# Patient Record
Sex: Female | Born: 1935
Health system: Southern US, Community
[De-identification: ages and names within clinical notes are randomized; demographics above are authoritative.]

## PROBLEM LIST (undated history)

## (undated) DIAGNOSIS — Z950 Presence of cardiac pacemaker: Secondary | ICD-10-CM

## (undated) DIAGNOSIS — I10 Essential (primary) hypertension: Secondary | ICD-10-CM

## (undated) DIAGNOSIS — K219 Gastro-esophageal reflux disease without esophagitis: Secondary | ICD-10-CM

## (undated) DIAGNOSIS — K449 Diaphragmatic hernia without obstruction or gangrene: Secondary | ICD-10-CM

## (undated) DIAGNOSIS — R001 Bradycardia, unspecified: Secondary | ICD-10-CM

## (undated) DIAGNOSIS — I639 Cerebral infarction, unspecified: Secondary | ICD-10-CM

## (undated) DIAGNOSIS — M1990 Unspecified osteoarthritis, unspecified site: Secondary | ICD-10-CM

## (undated) DIAGNOSIS — E7800 Pure hypercholesterolemia, unspecified: Secondary | ICD-10-CM

## (undated) HISTORY — PX: PACEMAKER INSERTION: SHX728

## (undated) HISTORY — PX: APPENDECTOMY: SHX54

## (undated) HISTORY — PX: TONSILLECTOMY: SUR1361

## (undated) HISTORY — DX: Cerebral infarction, unspecified: I63.9

## (undated) HISTORY — DX: Diaphragmatic hernia without obstruction or gangrene: K44.9

## (undated) SURGERY — ESOPHAGOGASTRODUODENOSCOPY (EGD) WITH PROPOFOL
Anesthesia: General

---

## 2004-07-06 ENCOUNTER — Ambulatory Visit: Payer: Self-pay | Admitting: Family Medicine

## 2004-08-09 ENCOUNTER — Ambulatory Visit: Payer: Self-pay | Admitting: Family Medicine

## 2004-11-17 ENCOUNTER — Ambulatory Visit: Payer: Self-pay | Admitting: Family Medicine

## 2004-12-19 ENCOUNTER — Ambulatory Visit: Payer: Self-pay | Admitting: Family Medicine

## 2005-01-04 ENCOUNTER — Ambulatory Visit: Payer: Self-pay | Admitting: Family Medicine

## 2005-01-16 ENCOUNTER — Ambulatory Visit: Payer: Self-pay | Admitting: Family Medicine

## 2005-03-14 ENCOUNTER — Ambulatory Visit: Payer: Self-pay | Admitting: Family Medicine

## 2005-04-12 ENCOUNTER — Ambulatory Visit: Payer: Self-pay | Admitting: Family Medicine

## 2005-04-27 ENCOUNTER — Ambulatory Visit: Payer: Self-pay | Admitting: Family Medicine

## 2005-07-31 ENCOUNTER — Ambulatory Visit: Payer: Self-pay | Admitting: Family Medicine

## 2005-11-30 ENCOUNTER — Ambulatory Visit: Payer: Self-pay | Admitting: Family Medicine

## 2005-12-20 ENCOUNTER — Ambulatory Visit: Payer: Self-pay | Admitting: Family Medicine

## 2005-12-28 ENCOUNTER — Ambulatory Visit: Payer: Self-pay | Admitting: Family Medicine

## 2008-08-14 ENCOUNTER — Ambulatory Visit: Payer: Self-pay | Admitting: Cardiology

## 2008-08-14 ENCOUNTER — Inpatient Hospital Stay (HOSPITAL_COMMUNITY): Admission: EM | Admit: 2008-08-14 | Discharge: 2008-08-18 | Payer: Self-pay | Admitting: Emergency Medicine

## 2008-08-17 ENCOUNTER — Ambulatory Visit: Payer: Self-pay | Admitting: Cardiology

## 2008-08-17 ENCOUNTER — Encounter: Payer: Self-pay | Admitting: Cardiology

## 2008-08-19 ENCOUNTER — Encounter: Payer: Self-pay | Admitting: Internal Medicine

## 2008-09-10 ENCOUNTER — Ambulatory Visit: Payer: Self-pay

## 2008-11-06 ENCOUNTER — Telehealth: Payer: Self-pay | Admitting: Internal Medicine

## 2008-11-07 DIAGNOSIS — K219 Gastro-esophageal reflux disease without esophagitis: Secondary | ICD-10-CM | POA: Insufficient documentation

## 2008-11-07 DIAGNOSIS — I10 Essential (primary) hypertension: Secondary | ICD-10-CM | POA: Insufficient documentation

## 2008-11-07 DIAGNOSIS — I498 Other specified cardiac arrhythmias: Secondary | ICD-10-CM

## 2008-11-07 DIAGNOSIS — K449 Diaphragmatic hernia without obstruction or gangrene: Secondary | ICD-10-CM

## 2008-11-19 ENCOUNTER — Ambulatory Visit: Payer: Self-pay | Admitting: Internal Medicine

## 2008-11-19 DIAGNOSIS — Z950 Presence of cardiac pacemaker: Secondary | ICD-10-CM | POA: Insufficient documentation

## 2008-12-21 ENCOUNTER — Encounter: Payer: Self-pay | Admitting: Internal Medicine

## 2009-08-04 ENCOUNTER — Encounter: Admission: RE | Admit: 2009-08-04 | Discharge: 2009-08-04 | Payer: Self-pay | Admitting: Family Medicine

## 2009-08-24 ENCOUNTER — Ambulatory Visit: Payer: Self-pay | Admitting: Internal Medicine

## 2009-11-24 ENCOUNTER — Ambulatory Visit: Payer: Self-pay | Admitting: Internal Medicine

## 2009-12-06 ENCOUNTER — Encounter: Payer: Self-pay | Admitting: Internal Medicine

## 2010-02-24 ENCOUNTER — Ambulatory Visit: Payer: Self-pay | Admitting: Internal Medicine

## 2010-03-16 ENCOUNTER — Encounter: Payer: Self-pay | Admitting: Internal Medicine

## 2010-06-02 ENCOUNTER — Ambulatory Visit: Payer: Self-pay | Admitting: Internal Medicine

## 2010-06-15 ENCOUNTER — Encounter: Payer: Self-pay | Admitting: Internal Medicine

## 2010-08-04 NOTE — Letter (Signed)
Summary: Remote Device Check  [facility] [employer], Main Office  [address]   [place], [place] [address]   Phone: [phone]  Fax: [phone]     December 06, 2009 MRN: [id number]   Kelsey Thompson [address] [place], [place]  [address]   Dear [name],   Your remote transmission was recieved and reviewed by your physician.  All diagnostics were within normal limits for you.  __X___Your next transmission is scheduled for:   02-24-2010.  Please transmit at any time this day.  If you have a wireless device your transmission will be sent automatically.   Sincerely,  Vella Kohler

## 2010-08-04 NOTE — Assessment & Plan Note (Signed)
Summary: medtronic/saf   Primary Provider:  Dr.Nyland in [place]    History of Present Illness: Kelsey Thompson returns today for followup.  She is an elderly woman with a h/o bradycardia who underwent PPM in February of 2010.  She returns today for followup.  She denies syncope and has very rare palpitation.   No peripheral edema at present.  No fever and chills.  She admits to dietary indiscretion with sodium.  Her blood pressure is not well controlled.   Current Medications (verified): 1)  Aspirin Ec 325 Mg Tbec (Aspirin) .... Take One Tablet By Mouth Daily 2)  Enalapril Maleate 5 Mg Tabs (Enalapril Maleate) .... Take One Tablet By Mouth Once Daily 3)  Nexium 40 Mg Cpdr (Esomeprazole Magnesium) .Marland Kitchen.. 1 By Mouth Once Daily 4)  Calcium-Vitamin D 600-200 Mg-Unit Tabs (Calcium-Vitamin D) .... 1/2 Tab Once Daily 5)  Fish Oil   Oil (Fish Oil) .... Once Daily  Allergies: 1)  ! Penicillin 2)  ! * Horse Serum  Past History:  Past Medical History: Last updated: 11/07/2008 Current Problems:  HYPERTENSION (ICD-401.9) BRADYCARDIA (ICD-427.89) HIATAL HERNIA (ICD-553.3) GERD (ICD-530.81)  Past Surgical History: Last updated: 11/07/2008  1 status post implant of a Medtronic Adapta dual- chamber pacemaker, Dr. Lewayne Bunting.    Cesarean section.   Status post appendectomy.    Status post tonsillectomy.   Review of Systems       The patient complains of dyspnea on exertion and peripheral edema.  The patient denies chest pain and syncope.    Vital Signs:  Patient profile:   77 year old female Height:      62 inches Weight:      135 pounds Pulse rate:   60 / minute BP sitting:   170 / 80  (left arm)  Vitals Entered By: Laurance Flatten CMA (August 24, 2009 10:28 AM)  Physical Exam  General:  Well developed, well nourished, in no acute distress. Head:  normocephalic and atraumatic Eyes:  PERRLA/EOM intact; conjunctiva and lids normal. Mouth:  Teeth, gums and palate normal. Oral mucosa  normal. Neck:  Neck supple, no JVD. No masses, thyromegaly or abnormal cervical nodes. Chest Wall:  Well healed PPM incision. Lungs:  Clear bilaterally to auscultation and percussion. Heart:  RRR with normal S1 and S2.  PMI is not enlarged or laterally displaced. Abdomen:  Bowel sounds positive; abdomen soft and non-tender without masses, organomegaly, or hernias noted. No hepatosplenomegaly. Msk:  Back normal, normal gait. Muscle strength and tone normal. Pulses:  pulses normal in all 4 extremities Extremities:  No clubbing or cyanosis.1-2+ edema. Neurologic:  Alert and oriented x 3.   PPM Specifications Following MD:  Lewayne Bunting, MD     PPM Vendor:  Medtronic     PPM Model Number:  ADDRL1     PPM Serial Number:  [id number] PPM DOI:  08/17/2008      Lead 1    Location: RA     DOI: 08/17/2008     Model #: 4098     Serial #: [id number]     Status: active Lead 2    Location: RV     DOI: 08/17/2008     Model #: 6213     Serial #: [id number]     Status: active  Magnet Response Rate:  BOL 85 ERI  65  Indications:  Sinus node dysfunction;  Huston Foley   PPM Follow Up Remote Check?  No Battery Voltage:  2.79 V     Battery  Est. Longevity:  10 years     Pacer Dependent:  No       PPM Device Measurements Atrium  Amplitude: 2.8 mV, Impedance: 431 ohms, Threshold: 0.5 V at 0.4 msec Right Ventricle  Amplitude: 5.6 mV, Impedance: 693 ohms, Threshold: 0.75 V at 0.4 msec  Episodes MS Episodes:  9     Percent Mode Switch:  <0.1%     Coumadin:  No Ventricular High Rate:  9     Atrial Pacing:  54%     Ventricular Pacing:  0.3%  Parameters Mode:  DDDR     Lower Rate Limit:  60     Upper Rate Limit:  130 Paced AV Delay:  300     Sensed AV Delay:  300 Next Remote Date:  11/24/2009     Next Cardiology Appt Due:  08/03/2010 Tech Comments:  No parameter changes.  9 VHR episodes the longest 6 seconds.  >85K single PVC's.  Carelink transmissions every 3 months. ROV 1 year Dr. Ladona Ridgel. Altha Harm, LPN   August 24, 2009 10:42 AM  MD Comments:  Agree with above.  Impression & Recommendations:  Problem # 1:  CARDIAC PACEMAKER IN SITU (ICD-V45.01) Her device is working normally.  Will recheck in several months.  Problem # 2:  HYPERTENSION (ICD-401.9) Her pressures are not well controlled today though she states that at home they are near normal.  I have asked her to maintain a low sodium diet, to record her pressures and followup with Dr. Kriste Basque. Her updated medication list for this problem includes:    Aspirin Ec 325 Mg Tbec (Aspirin) .Marland Kitchen... Take one tablet by mouth daily    Enalapril Maleate 5 Mg Tabs (Enalapril maleate) .Marland Kitchen... Take one tablet by mouth once daily  Patient Instructions: 1)  Your physician recommends that you schedule a follow-up appointment in: 11/24/09 -Carelink transmission  12 months with Dr Ladona Ridgel

## 2010-08-04 NOTE — Cardiovascular Report (Signed)
Summary: Office Visit Remote   Office Visit Remote   Imported By: Roderic Ovens 03/22/2010 13:56:24  _____________________________________________________________________  External Attachment:    Type:   Image     Comment:   External Document

## 2010-08-04 NOTE — Letter (Signed)
Summary: Remote Device Check  [facility], Main Office  [address]   [place], [place] [address]   Phone: [phone]  Fax: [phone]     June 15, 2010 MRN: [id number]   LETISIA SCHWALB [address] [place], [place]  [address]   Dear Ms. Livas,   Your remote transmission was recieved and reviewed by your physician.  All diagnostics were within normal limits for you.  __X____Your next office visit is scheduled for:  March 2012 with Dr Ladona Ridgel. Please call our office to schedule an appointment.    Sincerely,  Vella Kohler

## 2010-08-04 NOTE — Cardiovascular Report (Signed)
Summary: Office Visit Remote   Office Visit Remote   Imported By: Roderic Ovens 12/20/2009 15:22:50  _____________________________________________________________________  External Attachment:    Type:   Image     Comment:   External Document

## 2010-08-04 NOTE — Letter (Signed)
Summary: Remote Device Check  [facility], Main Office  [address]   [place], [place] [address]   Phone: [phone]  Fax: [phone]     March 16, 2010 MRN: [id number]   ZENIYA LAPIDUS [address] [place], [place]  [address]   Dear Ms. Trew,   Your remote transmission was recieved and reviewed by your physician.  All diagnostics were within normal limits for you.  __X___Your next transmission is scheduled for:  06-02-10.  Please transmit at any time this day.  If you have a wireless device your transmission will be sent automatically.   Sincerely,  Vella Kohler

## 2010-08-04 NOTE — Cardiovascular Report (Signed)
Summary: Office Visit Remote   Office Visit Remote   Imported By: Roderic Ovens 06/16/2010 14:13:28  _____________________________________________________________________  External Attachment:    Type:   Image     Comment:   External Document

## 2010-09-09 ENCOUNTER — Encounter: Payer: Self-pay | Admitting: Internal Medicine

## 2010-09-09 ENCOUNTER — Encounter (INDEPENDENT_AMBULATORY_CARE_PROVIDER_SITE_OTHER): Payer: Medicare Other | Admitting: Internal Medicine

## 2010-09-09 DIAGNOSIS — I5032 Chronic diastolic (congestive) heart failure: Secondary | ICD-10-CM

## 2010-09-09 DIAGNOSIS — I498 Other specified cardiac arrhythmias: Secondary | ICD-10-CM

## 2010-09-09 DIAGNOSIS — I10 Essential (primary) hypertension: Secondary | ICD-10-CM

## 2010-09-13 NOTE — Assessment & Plan Note (Signed)
Summary: mdt ppm-appt is 2:45 gd/rs per pt-mj rs time per pt call/gd r...   Visit Type:  Follow-up Primary Provider:  Dr.Nyland in [place]   CC:  Pacer check.  Pt took Enalapril this morning.  History of Present Illness: Kelsey Thompson returns today for followup.  She is an elderly woman with a h/o bradycardia who underwent PPM in February of 2010.  She returns today for followup.  She denies syncope and has very rare palpitation.   No peripheral edema at present.  No fever and chills.  She admits to dietary indiscretion with sodium.  Her blood pressure is not well controlled. No c/p.   Current Medications (verified): 1)  Aspirin Ec 325 Mg Tbec (Aspirin) .... Take One Tablet By Mouth Daily 2)  Enalapril Maleate 5 Mg Tabs (Enalapril Maleate) .... Take One Tablet By Mouth Once Daily 3)  Nexium 40 Mg Cpdr (Esomeprazole Magnesium) .Marland Kitchen.. 1 By Mouth Once Daily 4)  Calcium-Vitamin D 600-200 Mg-Unit Tabs (Calcium-Vitamin D) .... 1/2 Tab Once Daily 5)  Fish Oil   Oil (Fish Oil) .... Once Daily  Allergies: 1)  ! Penicillin 2)  ! * Horse Serum   Past History:  Past Medical History: Last updated: 11/07/2008 Current Problems:  HYPERTENSION (ICD-401.9) BRADYCARDIA (ICD-427.89) HIATAL HERNIA (ICD-553.3) GERD (ICD-530.81)  Past Surgical History: Last updated: 11/07/2008  1 status post implant of a Medtronic Adapta dual- chamber pacemaker, Dr. Lewayne Bunting.    Cesarean section.   Status post appendectomy.    Status post tonsillectomy.   Review of Systems  The patient denies chest pain, syncope, dyspnea on exertion, and peripheral edema.    Vital Signs:  Patient profile:   77 year old female Height:      62 inches Weight:      135 pounds BMI:     24.78 Pulse rate:   80 / minute Pulse rhythm:   regular Resp:     18 per minute BP sitting:   184 / 92  (right arm) Cuff size:   regular  Vitals Entered By: Kelsey Thompson (September 09, 2010 10:37 AM)  Physical Exam  General:  Well  developed, well nourished, in no acute distress. Head:  normocephalic and atraumatic Eyes:  PERRLA/EOM intact; conjunctiva and lids normal. Mouth:  Teeth, gums and palate normal. Oral mucosa normal. Neck:  Neck supple, no JVD. No masses, thyromegaly or abnormal cervical nodes. Chest Wall:  Well healed PPM incision. Lungs:  Clear bilaterally to auscultation with no wheezes, rales, or rhonchi. Heart:  RRR with normal S1 and S2.  PMI is not enlarged or laterally displaced. Abdomen:  Bowel sounds positive; abdomen soft and non-tender without masses, organomegaly, or hernias noted. No hepatosplenomegaly. Msk:  Back normal, normal gait. Muscle strength and tone normal. Pulses:  pulses normal in all 4 extremities Extremities:  No clubbing or cyanosis.1-2+ edema. Neurologic:  Alert and oriented x 3.   PPM Specifications Following MD:  Lewayne Bunting, MD     PPM Vendor:  Medtronic     PPM Model Number:  ADDRL1     PPM Serial Number:  [id number] PPM DOI:  08/17/2008      Lead 1    Location: RA     DOI: 08/17/2008     Model #: 4098     Serial #: [id number]     Status: active Lead 2    Location: RV     DOI: 08/17/2008     Model #: 6213  Serial #: [id number]     Status: active  Magnet Response Rate:  BOL 85 ERI  65  Indications:  Sinus node dysfunction;  Huston Foley   PPM Follow Up Pacer Dependent:  No      Episodes Coumadin:  No  Parameters Mode:  DDDR     Lower Rate Limit:  60     Upper Rate Limit:  130 Paced AV Delay:  300     Sensed AV Delay:  300 MD Comments:  Normal device function.  Impression & Recommendations:  Problem # 1:  CARDIAC PACEMAKER IN SITU (ICD-V45.01) Her device is working normally. Will recheck in several months.  Problem # 2:  HYPERTENSION (ICD-401.9) She is asymptomatic but her blood pressure is not well controlled. I have asked her to start amolodipine and maintain a low sodium diet. Her updated medication list for this problem includes:    Aspirin Ec 325 Mg Tbec  (Aspirin) .Marland Kitchen... Take one tablet by mouth daily    Enalapril Maleate 5 Mg Tabs (Enalapril maleate) .Marland Kitchen... Take one tablet by mouth once daily    Amlodipine Besylate 5 Mg Tabs (Amlodipine besylate) ..... One by mouth daily  Patient Instructions: 1)  Your physician wants you to follow-up in: 12 months with Dr Court Joy will receive a reminder letter in the mail two months in advance. If you don't receive a letter, please call our office to schedule the follow-up appointment. 2)  Your physician has recommended you make the following change in your medication: start Amlodipine 5mg  daily Prescriptions: AMLODIPINE BESYLATE 5 MG TABS (AMLODIPINE BESYLATE) one by mouth daily  #30 x 3   Entered by:   Dennis Bast, RN, BSN   Authorized by:   Laren Boom, MD, [facility]   Signed by:   Dennis Bast, RN, BSN on 09/09/2010   Method used:   Faxed to ...       [address] (retail)       [address]       [place], [place]  [address]  [place]       Ph: [phone]       Fax: [phone]   RxID:   [id number]

## 2010-09-20 NOTE — Cardiovascular Report (Signed)
Summary: Office Visit   Office Visit   Imported By: Roderic Ovens 09/14/2010 11:54:13  _____________________________________________________________________  External Attachment:    Type:   Image     Comment:   External Document

## 2010-10-18 LAB — COMPREHENSIVE METABOLIC PANEL
AST: 17 U/L (ref 0–37)
Albumin: 3.1 g/dL — ABNORMAL LOW (ref 3.5–5.2)
CO2: 20 mEq/L (ref 19–32)
Calcium: 8.9 mg/dL (ref 8.4–10.5)
Chloride: 101 mEq/L (ref 96–112)
Creatinine, Ser: 0.94 mg/dL (ref 0.4–1.2)
GFR calc Af Amer: >60 mL/min (ref >60)
Potassium: 4.4 mEq/L (ref 3.5–5.1)
Sodium: 131 mEq/L — ABNORMAL LOW (ref 135–145)
Total Protein: 6.4 g/dL (ref 6.0–8.3)

## 2010-10-18 LAB — CBC
HCT: 34.4 % — ABNORMAL LOW (ref 36.0–46.0)
HCT: 36.4 % (ref 36.0–46.0)
Hemoglobin: 11.8 g/dL — ABNORMAL LOW (ref 12.0–15.0)
Hemoglobin: 12.4 g/dL (ref 12.0–15.0)
Hemoglobin: 12.5 g/dL (ref 12.0–15.0)
MCHC: 33.8 g/dL (ref 30.0–36.0)
MCHC: 34.2 g/dL (ref 30.0–36.0)
MCV: 89.9 fL (ref 78.0–100.0)
MCV: 90.1 fL (ref 78.0–100.0)
Platelets: 254 10*3/uL (ref 150–400)
Platelets: 256 10*3/uL (ref 150–400)
Platelets: 256 10*3/uL (ref 150–400)
RBC: 3.83 MIL/uL — ABNORMAL LOW (ref 3.87–5.11)
RBC: 4.07 MIL/uL (ref 3.87–5.11)
RBC: 4.08 MIL/uL (ref 3.87–5.11)
RDW: 15.5 % (ref 11.5–15.5)
RDW: 15.1 % (ref 11.5–15.5)
WBC: 5.2 10*3/uL (ref 4.0–10.5)
WBC: 5.6 10*3/uL (ref 4.0–10.5)
WBC: 6.9 10*3/uL (ref 4.0–10.5)

## 2010-10-18 LAB — TSH: TSH: 2.844 u[IU]/mL (ref 0.350–4.500)

## 2010-10-18 LAB — BASIC METABOLIC PANEL
BUN: 18 mg/dL (ref 6–23)
BUN: 21 mg/dL (ref 6–23)
BUN: 20 mg/dL (ref 6–23)
CO2: 27 mEq/L (ref 19–32)
CO2: 30 mEq/L (ref 19–32)
Chloride: 100 mEq/L (ref 96–112)
Chloride: 96 mEq/L (ref 96–112)
Chloride: 97 mEq/L (ref 96–112)
Creatinine, Ser: 0.84 mg/dL (ref 0.4–1.2)
GFR calc Af Amer: >60 mL/min (ref >60)
GFR calc Af Amer: >60 mL/min (ref >60)
GFR calc Af Amer: >60 mL/min (ref >60)
GFR calc non Af Amer: 56 mL/min — ABNORMAL LOW (ref >60)
GFR calc non Af Amer: >60 mL/min (ref >60)
Glucose, Bld: 110 mg/dL — ABNORMAL HIGH (ref 70–99)
Glucose, Bld: 145 mg/dL — ABNORMAL HIGH (ref 70–99)
Glucose, Bld: 97 mg/dL (ref 70–99)
Potassium: 4 mEq/L (ref 3.5–5.1)
Potassium: 4.3 mEq/L (ref 3.5–5.1)
Sodium: 130 mEq/L — ABNORMAL LOW (ref 135–145)
Sodium: 127 mEq/L — ABNORMAL LOW (ref 135–145)

## 2010-10-18 LAB — CARDIAC PANEL(CRET KIN+CKTOT+MB+TROPI)
CK, MB: 4.7 ng/mL — ABNORMAL HIGH (ref 0.3–4.0)
CK, MB: 5.7 ng/mL — ABNORMAL HIGH (ref 0.3–4.0)
Relative Index: 3.7 — ABNORMAL HIGH (ref 0.0–2.5)
Troponin I: <0.01 ng/mL (ref 0.00–0.06)

## 2010-10-18 LAB — DIFFERENTIAL
Eosinophils Relative: 3 % (ref 0–5)
Lymphs Abs: 2 10*3/uL (ref 0.7–4.0)
Monocytes Relative: 9 % (ref 3–12)
Neutro Abs: 2.9 10*3/uL (ref 1.7–7.7)
Neutrophils Relative %: 52 % (ref 43–77)

## 2010-10-18 LAB — CK TOTAL AND CKMB (NOT AT ARMC)
CK, MB: 7.6 ng/mL — ABNORMAL HIGH (ref 0.3–4.0)
Total CK: 161 U/L (ref 7–177)

## 2010-10-18 LAB — PROTIME-INR: Prothrombin Time: 13.4 seconds (ref 11.6–15.2)

## 2010-11-15 NOTE — Op Note (Signed)
Kelsey Thompson, Kelsey Thompson NO.:  [id number]   MEDICAL RECORD NO.:  [id number]          PATIENT TYPE:  INP   LOCATION:  2807                         FACILITY:  [facility]   PHYSICIAN:  Doylene Canning. Ladona Ridgel, MD    DATE OF BIRTH:  1921-04-01   DATE OF PROCEDURE:  08/17/2008  DATE OF DISCHARGE:                               OPERATIVE REPORT   PROCEDURE PERFORMED:  Implantation of a dual-chamber pacemaker.   INDICATIONS:  Symptomatic brady with pauses of 5 seconds, and a  junctional rhythm at 30 beats per minute.   1.   INTRODUCTION:  .  The patient is an 77 year old woman, who was admitted to the hospital  with weakness and bradycardia.  She actually improved symptomatically  with her heart rates initially in the 30s and now up into the 50s and  60s; however, she has continued to have pauses up to 5 seconds and is  referred for permanent pacemaker insertion.  Of note, the pauses are off  of beta blocker therapy.   2. PROCEDURE:  After informed consent was obtained, the patient was  taken to the diagnostic EP lab in the fasting state.  After anesthesia,  preparation, and draping, intravenous Fentanyl and midazolam were given  for sedation.  30 mL of lidocaine were infiltrated into the left  infraclavicular region.  A 5-cm incision was carried out over this  region, and electrocautery was used to dissect down to the fascial  plane.  The left subclavian vein was then punctured x2 after 10 mL of  contrast were injected into it demonstrating it to be patent.  A  Medtronic model 5076, 52 cm active fixation pacing lead, serial  #[id number], was advanced into the right ventricle, and a Medtronic  model 5076, 45 cm active fixation pacing lead, serial #[id number], was  advanced to the right atrium.  Mapping was subsequently carried out in  the right ventricle at the final site.  The R-waves measured 10 mV, the  pacing impedance was 1000 ohms, and a threshold of 0.4 V at 0.5 msec.  A  10 V pacing did not stimulate the diaphragm and there was a prominent  injury current present.  With the ventricular lead in satisfactory  position, attention was then turned to placement of the atrial lead,  which was placed in the anterolateral portion of the right atrium where  P-waves measured 2 mV and the impedance was 600 ohms with the lead  actively fixed.  The threshold was 0.8 V at 0.5 msec, and 10 V pacing  again did not stimulate the diaphragm.  The injury current was  prominent.  With both leads in satisfactory position, they were secured  to the subpectoralis fascia with a figure-of-eight silk suture.  The  sewing sleeve was also secured with a silk suture.  Electrocautery was  utilized to make a subcutaneous pocket, and kanamycin irrigation was  utilized to irrigate the pocket.  At this point, the Medtronic Adapta L  dual-chamber pacemaker, serial [id number], was connected to the atrial  and ventricular leads and placed back in the subcutaneous pocket.  The  generator was secured with a silk suture.  At this point, the pocket was  again irrigated with kanamycin, and the incision was closed with 2-0  Vicryl and 3-0 Vicryl.  Benzoin was painted on the skin, Steri-Strips  were applied, a pressure dressing was placed, and the patient was  returned to his room in satisfactory condition.   3: COMPLICATIONS:  There were no immediate procedure complications.   4: RESULTS:  Successful implantation of a Medtronic dual-chamber  pacemaker in a patient with symptomatic bradycardia.      Doylene Canning. Ladona Ridgel, MD  Electronically Signed     GWT/MEDQ  D:  08/17/2008  T:  08/17/2008  Job:  [id number]   cc:   Rollene Rotunda, MD, [facility]  Delaney Meigs, M.D.

## 2010-11-15 NOTE — H&P (Signed)
NAME:  Kelsey Thompson, Kelsey Thompson NO.:  [id number]   MEDICAL RECORD NO.:  [id number]          PATIENT TYPE:  EMS   LOCATION:  MAJO                         FACILITY:  [facility]   PHYSICIAN:  Rollene Rotunda, MD, FACCDATE OF BIRTH:  [birth year]   DATE OF ADMISSION:  08/14/2008  DATE OF DISCHARGE:                              HISTORY & PHYSICAL   PRIMARY CARE PHYSICIAN:  Delaney Meigs, M.D.   REASON FOR PRESENTATION:  The patient has weakness and bradycardia.   HISTORY OF PRESENT ILLNESS:  The patient is a very lovely 77 year old  white female without a prior cardiac history.  She has been remarkably  well.  She lives by herself.  Her children live in homes around her.  She walks a mile daily with 11 laps in her house.  However, over the  past week she has noticed increased weakness with this activity with  climbing stairs.  She has been able to complete the 11 laps but feels  like she is just barely able to make it.  She feels fatigued.  She has  had some heaviness in her left arm and her legs.  She has not had any  chest pressure, neck or arm discomfort.  She has had some shortness of  breath with activity.  She had no resting shortness of breath.  Denies  any PND or orthopnea.  She has not had any palpitations, presyncope or  syncope.  She has been taking her blood pressure and has noted on the  monitor that her heart rate is in the 30s and 40s.  Her blood pressure  has been at baseline, which apparently sounds reasonably well  controlled.  Because of this, she came to the ER and was found to have  sinus bradycardia with a rate in the 40s.  On telemetry, watching it,  she appears to have junctional escape rhythms with rates in the 30s.  This was not captured on the EKG.  She is quite asymptomatic now.  She  has otherwise been feeling well.   PAST MEDICAL HISTORY:  Hypertension x1 year, hiatal hernia,  gastroesophageal reflux disease.   PAST SURGICAL HISTORY:  C-section,  tonsillectomy, appendectomy.   ALLERGIES:  PENICILLIN AND HORSE SERUM.   CURRENT MEDICATIONS:  Isradipine 5 mg daily, fish oil, Nexium 20 mg  daily, buffered aspirin, vitamin C.   SOCIAL HISTORY:  The patient is a widow.  She lives alone.  She quit  smoking 30 years ago.  She has 3 children.  Two of her children live  close by.   FAMILY HISTORY:  Noncontributory for early coronary disease.  Father had  an MI at 67.  She does have a brother who had a pacemaker placed at a  late age.  He lived to be in his mid 40s.   REVIEW OF SYSTEMS:  As stated in the HPI and negative for all other  systems.   PHYSICAL EXAMINATION:  The patient looks much younger than her stated  age.  She is very pleasant.  Blood pressure 161/70, heart rate 39 and  regular, respiratory rate  16, afebrile.  HEENT:  Eyelids are unremarkable.  Pupils equal, round and reactive to  light.  Fundi not visualized.  Oral mucosa unremarkable.  NECK:  No jugular venous distention at 45 degrees.  Carotid upstrokes  brisk and symmetric.  No bruits.  No thyromegaly.  LYMPHATICS:  No cervical, axillary, or inguinal adenopathy.  LUNGS:  Clear to auscultation bilaterally.  BACK:  No costovertebral angle tenderness.  CHEST:  Unremarkable.  HEART:  PMI not displaced or sustained.  S1 and S2 within normal limits.  No S3, no S4.  No clicks, no rubs, no murmurs.  ABDOMEN:  Flat.  Positive bowel sounds, normal in frequency and pitch.  No bruits, no rebound, no guarding, no midline pulsatile mass.  No  hepatomegaly.  No splenomegaly.  SKIN:  No rashes.  No nodules.  EXTREMITIES:  2+ pulses throughout.  No edema, no cyanosis, no clubbing.  NEURO:  Oriented to person, place, and time.  Cranial nerves II-XII  grossly intact.  Motor grossly intact.   LABS:  WBC 5.6, hemoglobin 11.8, platelets 256.  Sodium 134, potassium  4.5, BUN 18, creatinine 0.94, calcium 8.9.  Troponin 0.01.   Chest x-ray:  No acute cardiopulmonary process.  Linear  left lobe  scarring or atelectasis.   EKG:  Sinus bradycardia, rate 45, axis within normal limits, intervals  within normal limits, lateral T-wave inversion without old EKGs for  comparison, premature ventricular contraction with a junctional escape  rhythm with a junctional escape beat following.   ASSESSMENT/PLAN:  Weakness.  The patient's weakness is related to her  bradycardia.  She seems to have sinus node dysfunction.  There is some  junctional escape.  I did not catch her junctional escape on the EKG to  know whether she has any heart block.  Rather, this appears to be more  of a sinus node dysfunction.  Regardless, she does need a pacemaker.  We  will watch her on telemetry.  She should get a 12-lead rhythm strip for  further analysis.  Will check a thyroid-stimulating hormone.  Though I  do not suspect coronary blockage, we will rule her out for myocardial  infarction.  I will put her on the board for a temporary pacemaker and  discussed this with her.  1. Hypertension.  I will avoid a calcium channel blocker.  I will      treat her with an angiotensin-converting enzyme inhibitor.  2. Reflux.  She will continue with her Nexium.      Rollene Rotunda, MD, [facility]  Electronically Signed     JH/MEDQ  D:  08/14/2008  T:  08/14/2008  Job:  [id number]   cc:   Delaney Meigs, M.D.

## 2010-11-15 NOTE — Discharge Summary (Signed)
[name], [name] NO.:  [id number]   MEDICAL RECORD NO.:  [id number]          PATIENT TYPE:  INP   LOCATION:  2506                         FACILITY:  [facility]   PHYSICIAN:  Doylene Canning. Ladona Ridgel, MD    DATE OF BIRTH:  Jul 30, 1920   DATE OF ADMISSION:  08/14/2008  DATE OF DISCHARGE:  08/18/2008                               DISCHARGE SUMMARY   She has allergies to both HORSE SERUM and PENICILLIN.   TIME FOR THIS DICTATION:  Greater than 40 minutes, which includes exam  and explanations to the patient.   FINAL DIAGNOSES:  1. Discharging day 1 status post implant of a Medtronic Adapta dual-      chamber pacemaker, Dr. Lewayne Bunting.  2. Admitted with symptomatic bradycardia/sinus node dysfunction with      pauses up to 4.53 seconds.  3. Echocardiogram on this admission, ejection fraction 70%, no left      ventricular wall motion abnormalities, trivial aortic      regurgitation, trivial mitral regurgitation, tricuspid valve was      normal.   SECONDARY DIAGNOSES:  1. Hypertension.  2. Gastroesophageal reflux disease/hiatal hernia.  3. Cesarean section.  4. Status post appendectomy.  5. Status post tonsillectomy.   PROCEDURE:  August 17, 2008, implant of a Medtronic dual-chamber  pacemaker by Dr. Lewayne Bunting.  The patient had no postprocedural  complications.  Interrogation of the device after implant on day #1  shows all values within normal limits.   BRIEF HISTORY:  [name] is an 77 year old female.  She has no prior  cardiac history.  She is very active.  She walks 1 mile daily and does  11 laps in her house.   Over the past weekend, she has noted increasing weakness.  She has  trouble climbing stairs whereas before she has done well.  She is still  completing her 11 laps but is just barely able to make it.  She feels  fatigued.  She has some heaviness in her left arm and in her legs.  She  does not have chest pressure or neck or arm discomfort.  She has  had  some shortness of breath with activity.  She has no resting shortness of  breath.  She denies paroxysmal nocturnal dyspnea.  She denies orthopnea.  She has not had any history of palpitations, presyncope, or syncope.   She has been taking her blood pressure medications, and monitor shows  that her heart rate is in the 30s-40s.  Her blood pressure is at  baseline and sounds reasonably well controlled.   Because of this new onset of fatigue and shortness of breath with  exertion, she came to the emergency room.  She was found to have sinus  bradycardia.  Her rate is in the 40s.  It appears at times to be a  junctional escape with rhythms in the 30s.  The patient is asymptomatic  at rest.  The plan would be to admit the patient and place her on  telemetry.  We will cycle cardiac enzymes.  This appears to be a sinus  node dysfunction.  She probably will need a pacemaker.  Isradipine will  be placed on hold, and she will be started on enalapril.  We will  continue with her Nexium.   HOSPITAL COURSE:  The patient presents with profound symptomatic  bradycardia on August 14, 2008.  She was admitted to telemetry.  In  the overnight, she has had some pauses as great as 4.53 seconds.  Her  blood pressure is well controlled on enalapril.  She is scheduled for  pacemaker implantation on Monday, August 17, 2008.  The procedure did  well, a Medtronic Adapta dual-chamber pacemaker was implanted.  The  patient is ready for discharge postprocedure day #1.  Her incision has  no evidence of hematoma.  On postprocedure day #1, incisional care and  mobility of the left arm have been described to the patient.  A chest x-  ray shows that the leads are in appropriate position.   She is discharged on the following medications:  1. Enteric-coated aspirin 325 mg daily.  2. Enalapril, a new medication, 2.5 mg daily.  3. Nexium 20 mg daily.  4. Vitamin C 1 tablet daily.  5. Fish oil caps 1 tablet  daily.  She is asked to stop isradipine, which is DynaCirc to stop that.   She is asked to keep her incision dry for the next 7 days and sponge  bathe until Monday, August 24, 2008.  She has a followup with [facility], a map has been given to her, it is on [employer] and  [employer] on Thursday, September 10, 2008, at 42.  She sees Dr. Ladona Ridgel in  May 2010.  His office will call that appointment.   LABORATORY STUDIES THIS ADMISSION:  On the day of discharge, complete  blood count, white cells 6.9, hemoglobin 12.5, hematocrit 36.4, and  platelets were 238.  Sodium is 127, potassium 4.3, chloride 93,  carbonate 26, BUN is 20, creatinine 0.92, glucose 91.  TSH on this  admission 2.844.      Maple Mirza, PA      Doylene Canning. Ladona Ridgel, MD  Electronically Signed    GM/MEDQ  D:  08/18/2008  T:  08/18/2008  Job:  [id number]   cc:   Delaney Meigs, M.D.

## 2010-12-08 ENCOUNTER — Encounter: Payer: Medicare Other | Admitting: *Deleted

## 2010-12-13 ENCOUNTER — Encounter: Payer: Self-pay | Admitting: *Deleted

## 2010-12-15 ENCOUNTER — Ambulatory Visit (INDEPENDENT_AMBULATORY_CARE_PROVIDER_SITE_OTHER): Payer: Medicare Other | Admitting: *Deleted

## 2010-12-15 ENCOUNTER — Other Ambulatory Visit: Payer: Self-pay | Admitting: Internal Medicine

## 2010-12-15 DIAGNOSIS — Z950 Presence of cardiac pacemaker: Secondary | ICD-10-CM

## 2010-12-15 DIAGNOSIS — I498 Other specified cardiac arrhythmias: Secondary | ICD-10-CM

## 2010-12-15 DIAGNOSIS — I495 Sick sinus syndrome: Secondary | ICD-10-CM

## 2010-12-19 NOTE — Progress Notes (Signed)
Pacer remote check  

## 2010-12-23 ENCOUNTER — Encounter: Payer: Self-pay | Admitting: *Deleted

## 2010-12-27 ENCOUNTER — Other Ambulatory Visit: Payer: Self-pay | Admitting: Internal Medicine

## 2011-03-23 ENCOUNTER — Encounter: Payer: Self-pay | Admitting: Internal Medicine

## 2011-03-23 ENCOUNTER — Ambulatory Visit (INDEPENDENT_AMBULATORY_CARE_PROVIDER_SITE_OTHER): Payer: Medicare Other | Admitting: *Deleted

## 2011-03-23 ENCOUNTER — Other Ambulatory Visit: Payer: Self-pay | Admitting: Internal Medicine

## 2011-03-23 DIAGNOSIS — I495 Sick sinus syndrome: Secondary | ICD-10-CM

## 2011-03-25 LAB — REMOTE PACEMAKER DEVICE
AL AMPLITUDE: 2.8 mv
AL IMPEDENCE PM: 419 Ohm
ATRIAL PACING PM: 74
BAMS-0001: 175 {beats}/min
BATTERY VOLTAGE: 2.79 V
RV LEAD AMPLITUDE: 22.4 mv
RV LEAD IMPEDENCE PM: 673 Ohm

## 2011-03-31 NOTE — Progress Notes (Signed)
Pacer checked by remote 

## 2011-04-05 ENCOUNTER — Encounter: Payer: Self-pay | Admitting: *Deleted

## 2011-06-20 ENCOUNTER — Emergency Department (HOSPITAL_COMMUNITY)
Admission: EM | Admit: 2011-06-20 | Discharge: 2011-06-20 | Disposition: A | Discharge status: Home / Self Care | Payer: Medicare Other | Attending: Emergency Medicine | Admitting: Emergency Medicine

## 2011-06-20 ENCOUNTER — Emergency Department (HOSPITAL_COMMUNITY): Payer: Medicare Other

## 2011-06-20 DIAGNOSIS — M25551 Pain in right hip: Secondary | ICD-10-CM

## 2011-06-20 DIAGNOSIS — Z79899 Other long term (current) drug therapy: Secondary | ICD-10-CM | POA: Insufficient documentation

## 2011-06-20 DIAGNOSIS — M25559 Pain in unspecified hip: Secondary | ICD-10-CM | POA: Insufficient documentation

## 2011-06-20 HISTORY — DX: Unspecified osteoarthritis, unspecified site: M19.90

## 2011-06-20 HISTORY — DX: Bradycardia, unspecified: R00.1

## 2011-06-20 MED ORDER — PREDNISONE 10 MG PO TABS: 10.0000 mg | ORAL_TABLET | Freq: Every day | ORAL | Status: DC

## 2011-06-20 MED ORDER — NAPROXEN 500 MG PO TABS: 500.0000 mg | ORAL_TABLET | Freq: Two times a day (BID) | ORAL | Status: DC

## 2011-06-20 NOTE — ED Notes (Signed)
Pt c/o R hip pain x1 mon, increase pain today radiating into R leg, sts pain 0/10 was a 10/10 this am

## 2011-06-20 NOTE — ED Notes (Signed)
  Rt. Hip pain, progressively becoming worse over 1 month,.   Was in grocery store today and pain became severe.   Denies any injury

## 2011-06-20 NOTE — ED Provider Notes (Signed)
History     CSN: [id number] Arrival date & time: 06/20/2011  1:07 PM   First MD Initiated Contact with Patient 06/20/11 1621      Chief Complaint  Patient presents with  . Hip Pain    (Consider location/radiation/quality/duration/timing/severity/associated sxs/prior treatment) HPI... right lateral hip pain for approximately one month worse past 24 hours. No trauma, fever, chills, radiation of pain.  Walking makes it slightly worse. Pain is minimal and described as sharp.  Past Medical History  Diagnosis Date  . Bradycardia   . Arthritis     Past Surgical History  Procedure Date  . Pacemaker insertion     No family history on file.  History  Substance Use Topics  . Smoking status: Former Games developer  . Smokeless tobacco: Not on file  . Alcohol Use: No    OB History    Grav Para Term Preterm Abortions TAB SAB Ect Mult Living                  Review of Systems  All other systems reviewed and are negative.    Allergies  Horse-derived products and Penicillins  Home Medications   Current Outpatient Rx  Name Route Sig Dispense Refill  . AMLODIPINE BESYLATE 5 MG PO TABS  TAKE ONE TABLET BY MOUTH ONE TIME DAILY 30 tablet 6  . ASPIRIN EC 325 MG PO TBEC Oral Take 325 mg by mouth daily.      Marland Kitchen CALCIUM CARBONATE-VITAMIN D 500-200 MG-UNIT PO TABS Oral Take 1 tablet by mouth daily.      . ENALAPRIL MALEATE 5 MG PO TABS Oral Take 5 mg by mouth daily.      . OMEGA-3 FATTY ACIDS 1000 MG PO CAPS Oral Take 1 g by mouth daily.      Marland Kitchen OMEPRAZOLE 20 MG PO CPDR Oral Take 20 mg by mouth daily.      Marland Kitchen NAPROXEN 500 MG PO TABS Oral Take 1 tablet (500 mg total) by mouth 2 (two) times daily. 20 tablet 0  . PREDNISONE 10 MG PO TABS Oral Take 1 tablet (10 mg total) by mouth daily. 10 tablet 0    BP 152/75  Pulse 74  Temp(Src) 98 F (36.7 C) (Oral)  Resp 20  Ht 4\' 11"  (1.499 m)  Wt 125 lb (56.7 kg)  BMI 25.25 kg/m2  SpO2 98%  Physical Exam  Nursing note and vitals  reviewed. Constitutional: She is oriented to person, place, and time. She appears well-developed and well-nourished.  HENT:  Head: Normocephalic and atraumatic.  Eyes: Conjunctivae and EOM are normal. Pupils are equal, round, and reactive to light.  Neck: Normal range of motion. Neck supple.  Cardiovascular: Normal rate and regular rhythm.   Pulmonary/Chest: Effort normal and breath sounds normal.  Abdominal: Soft. Bowel sounds are normal.  Musculoskeletal:       Minimal tenderness right lateral hip. Slight pain with range of motion. Neurovascular intact distally  Neurological: She is alert and oriented to person, place, and time.  Skin: Skin is warm and dry.  Psychiatric: She has a normal mood and affect.    ED Course  Procedures (including critical care time)  Labs Reviewed - No data to display Dg Hip Complete Right  06/20/2011  *RADIOLOGY REPORT*  Clinical Data: 57-month history of right hip pain.  No known injuries.  RIGHT HIP - COMPLETE 2+ VIEW 06/20/2011:  Comparison: None.  Findings: No evidence of acute fracture or dislocation.  Joint space well preserved for  age.  Bone mineral density well preserved. Lucent focus in the inferior pubic ramus, well circumscribed.  Included AP pelvis demonstrates a normal-appearing contralateral left hip.  Sacroiliac joints intact with degenerative changes, right greater than left.  Symphysis pubis intact with degenerative changes.  Lucent focus in the left inferior pubic ramus near its junction with the symphysis.  IMPRESSION: No acute osseous abnormality.  Lucent foci in the right inferior pubic ramus and the left inferior pubic ramus near its junction with the symphysis, an indeterminate finding; myeloma might have this appearance.  Original Report Authenticated By: Arnell Sieving, M.D.     1. Right hip pain       MDM  Discussed x-ray findings with patient and daughter. Suspect bursitis. Will Rx prednisone and nonsteroidal. Follow up with  orthopedic        Donnetta Hutching, MD 06/20/11 1806

## 2011-06-22 ENCOUNTER — Ambulatory Visit (INDEPENDENT_AMBULATORY_CARE_PROVIDER_SITE_OTHER): Payer: Medicare Other | Admitting: *Deleted

## 2011-06-22 DIAGNOSIS — I498 Other specified cardiac arrhythmias: Secondary | ICD-10-CM

## 2011-06-23 ENCOUNTER — Other Ambulatory Visit: Payer: Self-pay | Admitting: Internal Medicine

## 2011-06-23 ENCOUNTER — Encounter: Payer: Self-pay | Admitting: Internal Medicine

## 2011-06-29 LAB — REMOTE PACEMAKER DEVICE
AL AMPLITUDE: 2.8 mv
BAMS-0001: 175 {beats}/min
BATTERY VOLTAGE: 2.79 V
VENTRICULAR PACING PM: 1

## 2011-06-30 NOTE — Progress Notes (Signed)
PPM remote 

## 2011-07-01 ENCOUNTER — Other Ambulatory Visit: Payer: Self-pay

## 2011-07-01 ENCOUNTER — Encounter (HOSPITAL_COMMUNITY): Payer: Self-pay

## 2011-07-01 ENCOUNTER — Emergency Department (HOSPITAL_COMMUNITY)
Admission: EM | Admit: 2011-07-01 | Discharge: 2011-07-02 | Disposition: A | Discharge status: Home / Self Care | Payer: Medicare Other | Attending: Emergency Medicine | Admitting: Emergency Medicine

## 2011-07-01 DIAGNOSIS — E7800 Pure hypercholesterolemia, unspecified: Secondary | ICD-10-CM | POA: Insufficient documentation

## 2011-07-01 DIAGNOSIS — H538 Other visual disturbances: Secondary | ICD-10-CM | POA: Insufficient documentation

## 2011-07-01 DIAGNOSIS — R112 Nausea with vomiting, unspecified: Secondary | ICD-10-CM | POA: Insufficient documentation

## 2011-07-01 DIAGNOSIS — Z950 Presence of cardiac pacemaker: Secondary | ICD-10-CM | POA: Insufficient documentation

## 2011-07-01 DIAGNOSIS — I10 Essential (primary) hypertension: Secondary | ICD-10-CM | POA: Insufficient documentation

## 2011-07-01 DIAGNOSIS — H409 Unspecified glaucoma: Secondary | ICD-10-CM | POA: Insufficient documentation

## 2011-07-01 DIAGNOSIS — M25559 Pain in unspecified hip: Secondary | ICD-10-CM | POA: Insufficient documentation

## 2011-07-01 DIAGNOSIS — Z79899 Other long term (current) drug therapy: Secondary | ICD-10-CM | POA: Insufficient documentation

## 2011-07-01 DIAGNOSIS — H5710 Ocular pain, unspecified eye: Secondary | ICD-10-CM | POA: Insufficient documentation

## 2011-07-01 DIAGNOSIS — R1110 Vomiting, unspecified: Secondary | ICD-10-CM

## 2011-07-01 DIAGNOSIS — M129 Arthropathy, unspecified: Secondary | ICD-10-CM | POA: Insufficient documentation

## 2011-07-01 DIAGNOSIS — K219 Gastro-esophageal reflux disease without esophagitis: Secondary | ICD-10-CM | POA: Insufficient documentation

## 2011-07-01 HISTORY — DX: Pure hypercholesterolemia, unspecified: E78.00

## 2011-07-01 HISTORY — DX: Gastro-esophageal reflux disease without esophagitis: K21.9

## 2011-07-01 HISTORY — DX: Presence of cardiac pacemaker: Z95.0

## 2011-07-01 HISTORY — DX: Essential (primary) hypertension: I10

## 2011-07-01 LAB — DIFFERENTIAL
Eosinophils Absolute: 0 10*3/uL (ref 0.0–0.7)
Lymphs Abs: 0.7 10*3/uL (ref 0.7–4.0)
Monocytes Relative: 2 % — ABNORMAL LOW (ref 3–12)
Neutro Abs: 8.6 10*3/uL — ABNORMAL HIGH (ref 1.7–7.7)
Neutrophils Relative %: 91 % — ABNORMAL HIGH (ref 43–77)

## 2011-07-01 LAB — CBC
Hemoglobin: 11.2 g/dL — ABNORMAL LOW (ref 12.0–15.0)
MCH: 29.1 pg (ref 26.0–34.0)
RBC: 3.85 MIL/uL — ABNORMAL LOW (ref 3.87–5.11)

## 2011-07-01 MED ORDER — ONDANSETRON HCL 4 MG/2ML IJ SOLN
INTRAMUSCULAR | Status: AC
  Filled 2011-07-01: qty 2

## 2011-07-01 MED ORDER — SODIUM CHLORIDE 0.9 % IV BOLUS (SEPSIS)
500.0000 mL | Freq: Once | INTRAVENOUS | Status: AC
  Administered 2011-07-02: 500 mL via INTRAVENOUS

## 2011-07-01 MED ORDER — SODIUM CHLORIDE 0.9 % IV SOLN
INTRAVENOUS | Status: DC
  Administered 2011-07-02: 01:00:00 via INTRAVENOUS

## 2011-07-01 MED ORDER — TETRACAINE HCL 0.5 % OP SOLN
OPHTHALMIC | Status: AC
  Administered 2011-07-01: 23:00:00 via OPHTHALMIC
  Filled 2011-07-01: qty 2

## 2011-07-01 NOTE — ED Notes (Signed)
Patient is awaiting MD to do eye exam on right eye.

## 2011-07-01 NOTE — ED Provider Notes (Deleted)
10:16 PM  Date: 07/01/2011  Rate:66  Rhythm: normal sinus rhythm and supraventricular tachycardia (SVT)  QRS Axis: normal  Intervals: normal  ST/T Wave abnormalities: normal  Conduction Disutrbances:none  Narrative Interpretation: normal EKG.  Old EKG Reviewed: unchanged    Carleene Cooper III, MD 07/01/11 2217  Carleene Cooper III, MD 07/01/11 [phone]

## 2011-07-01 NOTE — ED Notes (Signed)
DR Ignacia Palma

## 2011-07-01 NOTE — ED Notes (Signed)
Family at bedside. 

## 2011-07-01 NOTE — ED Provider Notes (Cosign Needed Addendum)
History     CSN: [id number]  Arrival date & time 07/01/11  2133   First MD Initiated Contact with Patient 07/01/11 2247      No chief complaint on file.   (Consider location/radiation/quality/duration/timing/severity/associated sxs/prior treatment) HPI Comments: The patient is an 77 year old woman who says that she has had nausea and vomiting. She says she has vomited and vomited, was unable to eat ice or drink water. There's been no diarrhea. This started early this morning. She has tried eat at lunch and supper but continued to vomit. Also, she says her right eyes become quite painful and the vision seemed blurry. She therefore was brought to [facility] for evaluation.  Patient is a 77 y.o. female presenting with vomiting. The history is provided by the patient and medical records. No language interpreter was used.  Emesis  This is a new problem. The current episode started 6 to 12 hours ago. The problem occurs 5 to 10 times per day. The problem has been gradually worsening. The emesis has an appearance of stomach contents. There has been no fever. Pertinent negatives include no diarrhea.    Past Medical History  Diagnosis Date  . Bradycardia   . Arthritis   . Hypertension   . GERD (gastroesophageal reflux disease)   . Hypercholesterolemia   . Pacemaker     Past Surgical History  Procedure Date  . Pacemaker insertion   . Tonsillectomy   . Appendectomy   . Cesarean section     Family History  Problem Relation Age of Onset  . Heart failure Mother   . Diabetes Father   . Heart attack Father     History  Substance Use Topics  . Smoking status: Former Smoker    Types: Cigarettes    Quit date: 06/30/1981  . Smokeless tobacco: Not on file  . Alcohol Use: No    OB History    Grav Para Term Preterm Abortions TAB SAB Ect Mult Living                  Review of Systems  Constitutional: Negative.   HENT: Negative.   Eyes: Positive for pain.  Respiratory:  Negative.   Cardiovascular: Negative.   Gastrointestinal: Positive for nausea and vomiting. Negative for diarrhea.  Genitourinary: Negative.   Musculoskeletal: Negative.   Neurological: Negative.   Hematological: Negative.   Psychiatric/Behavioral: Negative.     Allergies  Horse-derived products and Penicillins  Home Medications   Current Outpatient Rx  Name Route Sig Dispense Refill  . AMLODIPINE BESYLATE 5 MG PO TABS Oral Take 5 mg by mouth daily.      . ASPIRIN EC 325 MG PO TBEC Oral Take 325 mg by mouth daily.     Marland Kitchen CALCIUM CARBONATE-VITAMIN D 500-200 MG-UNIT PO TABS Oral Take 1 tablet by mouth daily.     . ENALAPRIL MALEATE 5 MG PO TABS Oral Take 5 mg by mouth daily.      . OMEGA-3 FATTY ACIDS 1000 MG PO CAPS Oral Take 1 g by mouth daily.     Marland Kitchen OMEPRAZOLE 20 MG PO CPDR Oral Take 20 mg by mouth daily.       BP 161/73  Pulse 66  Temp(Src) 97.6 F (36.4 C) (Oral)  Resp 16  Ht 5' (1.524 m)  Wt 127 lb 8 oz (57.834 kg)  BMI 24.90 kg/m2  SpO2 100%  Physical Exam  Constitutional: She is oriented to person, place, and time.  Patient is an elderly woman who complains bitterly of vomiting. She also complains bitterly of eye pain in the right eye.  HENT:  Head: Normocephalic and atraumatic.  Right Ear: External ear normal.  Left Ear: External ear normal.  Mouth/Throat: Oropharynx is clear and moist.  Eyes: Conjunctivae and EOM are normal. Pupils are equal, round, and reactive to light.       Anterior chambers appear shallow. There is no redness of the conjunctiva. The corneae are clear.  R pupil appx 4 mm, L pupil 2 mm.    Neck: Normal range of motion. Neck supple.  Cardiovascular: Normal rate, regular rhythm and normal heart sounds.   Pulmonary/Chest: Effort normal and breath sounds normal.  Abdominal: Soft. Bowel sounds are normal.  Musculoskeletal: Normal range of motion.  Neurological: She is alert and oriented to person, place, and time.       No sensory or motor  loss.  Skin: Skin is warm and dry.  Psychiatric: She has a normal mood and affect. Her behavior is normal.    ED Course  Procedures (including critical care time)  Appx 10:20 P.M.   Date: 07/01/2011  Rate:66  Rhythm: normal sinus rhythm and premature ventricular contraction.   QRS Axis: normal  Intervals: normal  ST/T Wave abnormalities: normal  Conduction Disutrbances:none  Narrative Interpretation: normal EKG.  Old EKG Reviewed: unchanged    Carleene Cooper III, MD 07/01/11 2217  Carleene Cooper III, MD 07/01/11 2313 11:27 PM Pt was seen and had physical examination.  Lab workup was ordered.  IV fluids were ordered.     12:31 AM Results for orders placed during the hospital encounter of 07/01/11  CBC      Component Value Range   WBC 9.5  4.0 - 10.5 (K/uL)   RBC 3.85 (*) 3.87 - 5.11 (MIL/uL)   Hemoglobin 11.2 (*) 12.0 - 15.0 (g/dL)   HCT 16.1 (*) 09.6 - 46.0 (%)   MCV 86.0  78.0 - 100.0 (fL)   MCH 29.1  26.0 - 34.0 (pg)   MCHC 33.8  30.0 - 36.0 (g/dL)   RDW 04.5  40.9 - 81.1 (%)   Platelets 247  150 - 400 (K/uL)  DIFFERENTIAL      Component Value Range   Neutrophils Relative 91 (*) 43 - 77 (%)   Neutro Abs 8.6 (*) 1.7 - 7.7 (K/uL)   Lymphocytes Relative 7 (*) 12 - 46 (%)   Lymphs Abs 0.7  0.7 - 4.0 (K/uL)   Monocytes Relative 2 (*) 3 - 12 (%)   Monocytes Absolute 0.2  0.1 - 1.0 (K/uL)   Eosinophils Relative 0  0 - 5 (%)   Eosinophils Absolute 0.0  0.0 - 0.7 (K/uL)   Basophils Relative 0  0 - 1 (%)   Basophils Absolute 0.0  0.0 - 0.1 (K/uL)  COMPREHENSIVE METABOLIC PANEL      Component Value Range   Sodium 131 (*) 135 - 145 (mEq/L)   Potassium 3.4 (*) 3.5 - 5.1 (mEq/L)   Chloride 96  96 - 112 (mEq/L)   CO2 26  19 - 32 (mEq/L)   Glucose, Bld 181 (*) 70 - 99 (mg/dL)   BUN 17  6 - 23 (mg/dL)   Creatinine, Ser 9.14  0.50 - 1.10 (mg/dL)   Calcium 9.0  8.4 - 78.2 (mg/dL)   Total Protein 7.4  6.0 - 8.3 (g/dL)   Albumin 3.7  3.5 - 5.2 (g/dL)   AST 19  0 - 37  (  U/L)   ALT 17  0 - 35 (U/L)   Alkaline Phosphatase 71  39 - 117 (U/L)   Total Bilirubin 0.3  0.3 - 1.2 (mg/dL)   GFR calc non Af Amer 77 (*) >90 (mL/min)   GFR calc Af Amer 89 (*) >90 (mL/min)  LIPASE, BLOOD      Component Value Range   Lipase 33  11 - 59 (U/L)  URINALYSIS, ROUTINE W REFLEX MICROSCOPIC      Component Value Range   Color, Urine YELLOW  YELLOW    APPearance CLEAR  CLEAR    Specific Gravity, Urine 1.010  1.005 - 1.030    pH 8.0  5.0 - 8.0    Glucose, UA 250 (*) NEGATIVE (mg/dL)   Hgb urine dipstick NEGATIVE  NEGATIVE    Bilirubin Urine NEGATIVE  NEGATIVE    Ketones, ur 15 (*) NEGATIVE (mg/dL)   Protein, ur NEGATIVE  NEGATIVE (mg/dL)   Urobilinogen, UA 0.2  0.0 - 1.0 (mg/dL)   Nitrite NEGATIVE  NEGATIVE    Leukocytes, UA NEGATIVE  NEGATIVE    Dg Hip Complete Right  06/20/2011  *RADIOLOGY REPORT*  Clinical Data: 51-month history of right hip pain.  No known injuries.  RIGHT HIP - COMPLETE 2+ VIEW 06/20/2011:  Comparison: None.  Findings: No evidence of acute fracture or dislocation.  Joint space well preserved for age.  Bone mineral density well preserved. Lucent focus in the inferior pubic ramus, well circumscribed.  Included AP pelvis demonstrates a normal-appearing contralateral left hip.  Sacroiliac joints intact with degenerative changes, right greater than left.  Symphysis pubis intact with degenerative changes.  Lucent focus in the left inferior pubic ramus near its junction with the symphysis.  IMPRESSION: No acute osseous abnormality.  Lucent foci in the right inferior pubic ramus and the left inferior pubic ramus near its junction with the symphysis, an indeterminate finding; myeloma might have this appearance.  Original Report Authenticated By: Arnell Sieving, M.D.   12:32 AM Lab workup shows glucose elevated at 181.  CBC shows a WBC of 9500 with 91% neutrophils.  UA shows ketones 15, glycosuria of 250.  Waiting for acute abdominal series and tonopen exam.   Signed out to Dr. Bebe Shaggy at 12:30 A.M.     1. Glaucoma   2. Vomiting          Carleene Cooper III, MD 07/02/11 0031  Carleene Cooper III, MD 07/02/11 [phone]

## 2011-07-01 NOTE — ED Notes (Signed)
EKG RUN BY EMT R Henery Betzold OLD AND NEW EKG GIVEN TO DR DAVIS

## 2011-07-02 LAB — POCT I-STAT, CHEM 8
BUN: 13 mg/dL (ref 6–23)
Calcium, Ion: 1.17 mmol/L (ref 1.12–1.32)
Chloride: 100 mEq/L (ref 96–112)
Creatinine, Ser: 0.7 mg/dL (ref 0.50–1.10)
Glucose, Bld: 163 mg/dL — ABNORMAL HIGH (ref 70–99)
HCT: 38 % (ref 36.0–46.0)
Hemoglobin: 12.9 g/dL (ref 12.0–15.0)
Potassium: 4.3 mEq/L (ref 3.5–5.1)
Sodium: 132 mEq/L — ABNORMAL LOW (ref 135–145)
TCO2: 23 mmol/L (ref 0–100)

## 2011-07-02 LAB — COMPREHENSIVE METABOLIC PANEL
Alkaline Phosphatase: 71 U/L (ref 39–117)
BUN: 17 mg/dL (ref 6–23)
CO2: 26 mEq/L (ref 19–32)
Chloride: 96 mEq/L (ref 96–112)
GFR calc Af Amer: 89 mL/min — ABNORMAL LOW (ref >90)
GFR calc non Af Amer: 77 mL/min — ABNORMAL LOW (ref >90)
Glucose, Bld: 181 mg/dL — ABNORMAL HIGH (ref 70–99)
Potassium: 3.4 mEq/L — ABNORMAL LOW (ref 3.5–5.1)
Total Bilirubin: 0.3 mg/dL (ref 0.3–1.2)

## 2011-07-02 LAB — URINALYSIS, ROUTINE W REFLEX MICROSCOPIC
Bilirubin Urine: NEGATIVE
Ketones, ur: 15 mg/dL — AB
Nitrite: NEGATIVE
Urobilinogen, UA: 0.2 mg/dL (ref 0.0–1.0)

## 2011-07-02 LAB — LIPASE, BLOOD: Lipase: 33 U/L (ref 11–59)

## 2011-07-02 MED ORDER — TIMOLOL MALEATE 0.5 % OP SOLN
1.0000 [drp] | Freq: Once | OPHTHALMIC | Status: AC
  Administered 2011-07-02: 1 [drp] via OPHTHALMIC

## 2011-07-02 MED ORDER — ACETAZOLAMIDE 125 MG PO TABS: ORAL_TABLET | ORAL | Status: DC

## 2011-07-02 MED ORDER — ONDANSETRON HCL 4 MG/2ML IJ SOLN
4.0000 mg | Freq: Once | INTRAMUSCULAR | Status: AC
  Administered 2011-07-02: 4 mg via INTRAVENOUS
  Filled 2011-07-02: qty 2

## 2011-07-02 MED ORDER — ACETAZOLAMIDE SODIUM 500 MG IJ SOLR
250.0000 mg | INTRAMUSCULAR | Status: AC
  Administered 2011-07-02: 250 mg via INTRAVENOUS
  Filled 2011-07-02: qty 500

## 2011-07-02 MED ORDER — ACETAZOLAMIDE 250 MG PO TABS
250.0000 mg | ORAL_TABLET | ORAL | Status: AC
  Administered 2011-07-02: 250 mg via ORAL
  Filled 2011-07-02: qty 1

## 2011-07-02 MED ORDER — PILOCARPINE HCL 2 % OP SOLN
1.0000 [drp] | Freq: Once | OPHTHALMIC | Status: AC
  Administered 2011-07-02: 1 [drp] via OPHTHALMIC

## 2011-07-02 MED ORDER — PILOCARPINE HCL 2 % OP SOLN
1.0000 [drp] | OPHTHALMIC | Status: AC
  Administered 2011-07-02: 1 [drp] via OPHTHALMIC
  Filled 2011-07-02: qty 15

## 2011-07-02 MED ORDER — POTASSIUM CHLORIDE ER 10 MEQ PO TBCR: EXTENDED_RELEASE_TABLET | ORAL | Status: DC

## 2011-07-02 MED ORDER — METOCLOPRAMIDE HCL 5 MG/ML IJ SOLN
10.0000 mg | Freq: Once | INTRAMUSCULAR | Status: AC
  Administered 2011-07-02: 10 mg via INTRAVENOUS
  Filled 2011-07-02: qty 2

## 2011-07-02 MED ORDER — TIMOLOL MALEATE 0.5 % OP SOLN
1.0000 [drp] | Freq: Once | OPHTHALMIC | Status: AC
  Administered 2011-07-02: 1 [drp] via OPHTHALMIC
  Filled 2011-07-02: qty 5

## 2011-07-02 MED ORDER — ACETAZOLAMIDE SODIUM 500 MG IJ SOLR
250.0000 mg | INTRAMUSCULAR | Status: DC
  Filled 2011-07-02: qty 500

## 2011-07-02 MED ORDER — POTASSIUM CHLORIDE CRYS ER 20 MEQ PO TBCR
40.0000 meq | EXTENDED_RELEASE_TABLET | Freq: Once | ORAL | Status: AC
  Administered 2011-07-02: 40 meq via ORAL
  Filled 2011-07-02: qty 2

## 2011-07-02 MED ORDER — MORPHINE SULFATE 4 MG/ML IJ SOLN
4.0000 mg | Freq: Once | INTRAMUSCULAR | Status: AC
  Administered 2011-07-02: 4 mg via INTRAVENOUS
  Filled 2011-07-02: qty 1

## 2011-07-02 NOTE — ED Notes (Signed)
Per the pharmacist, the Diamox 250 mg, iv needs to be mixed in sterile water 5 mL and pushed over 2 minutes.

## 2011-07-02 NOTE — ED Notes (Signed)
MD at bedside. 

## 2011-07-02 NOTE — ED Notes (Signed)
Patient discharged with her family for follow up appointment with opthalmologist.

## 2011-07-02 NOTE — ED Notes (Signed)
MD at bedside checking intraocular pressure.

## 2011-07-02 NOTE — ED Provider Notes (Addendum)
12:41 AM  Assumed care from dr Ignacia Palma, concern for glaucoma in OD Corneal hazing noted, minimal reactivity, IOP >40 ophtho paged Timolol ordered, diamox ordered   1:01 AM D/w dr Delaney Meigs Start pilocarpine/timolol/diamox Recheck pressure in 30 minutes, call him back at [phone]  2:11 AM IOP > 40 D/w dr Delaney Meigs, try another dose of drops and check in 30 minutes  2:53 AM IOP now at 35 Pt improved, reports vision improved, pupil now miotic D/w dr Shari Heritage, another dose of diamox, check KCL, will see her at 7am in his office and continue pilo/timolol q 1 hr until then BP 150/72  Pulse 74  Temp(Src) 97.6 F (36.4 C) (Oral)  Resp 15  Ht 5' (1.524 m)  Wt 127 lb 8 oz (57.834 kg)  BMI 24.90 kg/m2  SpO2 99%   Joya Gaskins, MD 07/02/11 0254  7:19 AM Pt with some nausea/vomiting, but now improved Recheck pressure at 37 D/w dr Shari Heritage will see her at his office immediately Family will take patient  Joya Gaskins, MD 07/02/11 [phone]

## 2011-07-03 LAB — URINE CULTURE

## 2011-07-19 ENCOUNTER — Encounter: Payer: Self-pay | Admitting: *Deleted

## 2011-07-23 ENCOUNTER — Other Ambulatory Visit: Payer: Self-pay | Admitting: Internal Medicine

## 2011-07-24 ENCOUNTER — Other Ambulatory Visit: Payer: Self-pay

## 2011-07-24 MED ORDER — AMLODIPINE BESYLATE 5 MG PO TABS: 5.0000 mg | ORAL_TABLET | Freq: Every day | ORAL | Status: DC

## 2011-09-26 ENCOUNTER — Encounter: Payer: Self-pay | Admitting: Internal Medicine

## 2011-09-26 ENCOUNTER — Ambulatory Visit (INDEPENDENT_AMBULATORY_CARE_PROVIDER_SITE_OTHER): Payer: Medicare Other | Admitting: Internal Medicine

## 2011-09-26 VITALS — BP 126/70 | HR 72 | Ht 59.0 in | Wt 133.0 lb

## 2011-09-26 DIAGNOSIS — I495 Sick sinus syndrome: Secondary | ICD-10-CM

## 2011-09-26 DIAGNOSIS — I10 Essential (primary) hypertension: Secondary | ICD-10-CM

## 2011-09-26 DIAGNOSIS — Z950 Presence of cardiac pacemaker: Secondary | ICD-10-CM

## 2011-09-26 LAB — PACEMAKER DEVICE OBSERVATION
AL THRESHOLD: 0.5 V
BAMS-0001: 175 {beats}/min
BATTERY VOLTAGE: 2.79 V
RV LEAD AMPLITUDE: 16 mv

## 2011-09-26 NOTE — Assessment & Plan Note (Signed)
Her blood pressure today is well controlled. She will continue her current medical therapy and maintain a low-sodium diet. 

## 2011-09-26 NOTE — Patient Instructions (Signed)
Your physician wants you to follow-up in: 12 months with Dr Court Joy will receive a reminder letter in the mail two months in advance. If you don't receive a letter, please call our office to schedule the follow-up appointment.   Remote monitoring is used to monitor your Pacemaker of ICD from home. This monitoring reduces the number of office visits required to check your device to one time per year. It allows [place] to keep an eye on the functioning of your device to ensure it is working properly. You are scheduled for a device check from home on 12/28/11. You may send your transmission at any time that day. If you have a wireless device, the transmission will be sent automatically. After your physician reviews your transmission, you will receive a postcard with your next transmission date.

## 2011-09-26 NOTE — Assessment & Plan Note (Signed)
Her device is working normally. Histograms demonstrate one 5 minute episode of atrial fibrillation. The clinical significance of this one brief episode is uncertain. I recommended watchful waiting.

## 2011-09-26 NOTE — Progress Notes (Signed)
HPI [name] returns today for followup. She is a very pleasant 78 year old woman with a history of hypertension and symptomatic bradycardia status post permanent pacemaker insertion. In the interim, she has done well. She denies chest pain, syncope, weakness, or peripheral edema. If she walks up a flight of stairs, she will get winded but this resolves immediately. Allergies  Allergen Reactions  . Horse-Derived Products Other (See Comments)    Reaction unknown  . Penicillins Other (See Comments)    Reaction unknown     Current Outpatient Prescriptions  Medication Sig Dispense Refill  . acetaZOLAMIDE (DIAMOX) 125 MG tablet Use as instructed by the eye physician   20 tablet  0  . amLODipine (NORVASC) 5 MG tablet Take 1 tablet (5 mg total) by mouth daily.  30 tablet  0  . aspirin EC 325 MG tablet Take 325 mg by mouth daily.       . calcium-vitamin D (OSCAL WITH D) 500-200 MG-UNIT per tablet Take 1 tablet by mouth daily.       . enalapril (VASOTEC) 5 MG tablet Take 5 mg by mouth daily.        . fish oil-omega-3 fatty acids 1000 MG capsule Take 1 g by mouth daily.       Marland Kitchen omeprazole (PRILOSEC) 20 MG capsule Take 20 mg by mouth daily.       . potassium chloride (K-DUR) 10 MEQ tablet One tablet PO daily  5 tablet  0     Past Medical History  Diagnosis Date  . Bradycardia   . Arthritis   . Hypertension   . GERD (gastroesophageal reflux disease)   . Hypercholesterolemia   . Pacemaker   . Hiatal hernia     ROS:   All systems reviewed and negative except as noted in the HPI.   Past Surgical History  Procedure Date  . Pacemaker insertion   . Tonsillectomy   . Appendectomy   . Cesarean section      Family History  Problem Relation Age of Onset  . Heart failure Mother   . Diabetes Father   . Heart attack Father      History   Social History  . Marital Status: Widowed    Spouse Name: N/A    Number of Children: N/A  . Years of Education: N/A   Occupational  History  . Not on file.   Social History Main Topics  . Smoking status: Former Smoker    Types: Cigarettes    Quit date: 06/30/1981  . Smokeless tobacco: Not on file  . Alcohol Use: No  . Drug Use: No  . Sexually Active: No   Other Topics Concern  . Not on file   Social History Narrative  . No narrative on file     BP 126/70  Pulse 72  Ht 4\' 11"  (1.499 m)  Wt 60.328 kg (133 lb)  BMI 26.86 kg/m2  Physical Exam:  Well appearing elderly woman, NAD HEENT: Unremarkable Neck:  No JVD, no thyromegally Lungs:  Clear with no wheezes, rales, or rhonchi. Well-healed pacemaker incision. HEART:  Regular rate rhythm, no murmurs, no rubs, no clicks Abd:  soft, positive bowel sounds, no organomegally, no rebound, no guarding Ext:  2 plus pulses, no edema, no cyanosis, no clubbing Skin:  No rashes no nodules Neuro:  CN II through XII intact, motor grossly intact  DEVICE  Normal device function.  See PaceArt for details.   Assess/Plan:

## 2011-12-22 ENCOUNTER — Encounter: Payer: Self-pay | Admitting: Internal Medicine

## 2011-12-28 ENCOUNTER — Encounter: Payer: Medicare Other | Admitting: *Deleted

## 2012-01-01 ENCOUNTER — Encounter: Payer: Self-pay | Admitting: *Deleted

## 2012-01-08 ENCOUNTER — Telehealth: Payer: Self-pay | Admitting: Internal Medicine

## 2012-01-08 NOTE — Telephone Encounter (Signed)
New msg Pt wants to know about letter she received about transmission

## 2012-01-08 NOTE — Telephone Encounter (Signed)
Spoke w/pt to let know transmission was received on 12-22-11. Pt aware to disregard letter/kwm

## 2012-01-24 LAB — REMOTE PACEMAKER DEVICE
AL AMPLITUDE: 2.8 mv
AL IMPEDENCE PM: 430 Ohm
BATTERY VOLTAGE: 2.79 V
RV LEAD IMPEDENCE PM: 738 Ohm
VENTRICULAR PACING PM: 0

## 2012-02-13 ENCOUNTER — Encounter: Payer: Self-pay | Admitting: *Deleted

## 2012-03-25 ENCOUNTER — Ambulatory Visit (INDEPENDENT_AMBULATORY_CARE_PROVIDER_SITE_OTHER): Payer: Medicare Other | Admitting: *Deleted

## 2012-03-25 ENCOUNTER — Encounter: Payer: Self-pay | Admitting: Internal Medicine

## 2012-03-25 DIAGNOSIS — Z950 Presence of cardiac pacemaker: Secondary | ICD-10-CM

## 2012-03-25 DIAGNOSIS — I495 Sick sinus syndrome: Secondary | ICD-10-CM

## 2012-03-26 ENCOUNTER — Other Ambulatory Visit: Payer: Self-pay | Admitting: Internal Medicine

## 2012-04-01 LAB — REMOTE PACEMAKER DEVICE
AL THRESHOLD: 0.5 V
ATRIAL PACING PM: 54
RV LEAD THRESHOLD: 0.75 V

## 2012-04-10 ENCOUNTER — Encounter: Payer: Self-pay | Admitting: *Deleted

## 2012-07-06 ENCOUNTER — Encounter: Payer: Self-pay | Admitting: Internal Medicine

## 2012-07-08 ENCOUNTER — Ambulatory Visit (INDEPENDENT_AMBULATORY_CARE_PROVIDER_SITE_OTHER): Payer: Medicare Other | Admitting: *Deleted

## 2012-07-08 DIAGNOSIS — Z950 Presence of cardiac pacemaker: Secondary | ICD-10-CM

## 2012-07-08 DIAGNOSIS — I495 Sick sinus syndrome: Secondary | ICD-10-CM

## 2012-07-10 LAB — REMOTE PACEMAKER DEVICE
AL AMPLITUDE: 2.8 mv
AL IMPEDENCE PM: 403 Ohm
BATTERY VOLTAGE: 2.79 V
RV LEAD AMPLITUDE: 16 mv
VENTRICULAR PACING PM: 0

## 2012-07-16 ENCOUNTER — Encounter: Payer: Self-pay | Admitting: *Deleted

## 2012-09-10 ENCOUNTER — Other Ambulatory Visit: Payer: Self-pay | Admitting: Internal Medicine

## 2012-09-23 ENCOUNTER — Other Ambulatory Visit: Payer: Self-pay | Admitting: Internal Medicine

## 2012-09-26 ENCOUNTER — Encounter: Payer: Self-pay | Admitting: Internal Medicine

## 2012-09-26 ENCOUNTER — Ambulatory Visit (INDEPENDENT_AMBULATORY_CARE_PROVIDER_SITE_OTHER): Payer: Medicare Other | Admitting: Internal Medicine

## 2012-09-26 VITALS — BP 128/75 | HR 64 | Ht 59.0 in | Wt 134.2 lb

## 2012-09-26 DIAGNOSIS — I495 Sick sinus syndrome: Secondary | ICD-10-CM

## 2012-09-26 DIAGNOSIS — I10 Essential (primary) hypertension: Secondary | ICD-10-CM

## 2012-09-26 DIAGNOSIS — R001 Bradycardia, unspecified: Secondary | ICD-10-CM

## 2012-09-26 DIAGNOSIS — I498 Other specified cardiac arrhythmias: Secondary | ICD-10-CM

## 2012-09-26 LAB — PACEMAKER DEVICE OBSERVATION
BAMS-0001: 175 {beats}/min
BATTERY VOLTAGE: 2.79 V
RV LEAD AMPLITUDE: 11.2 mv
RV LEAD THRESHOLD: 0.75 V

## 2012-09-26 NOTE — Progress Notes (Signed)
HPI Kelsey Thompson returns today for followup. She is a very pleasant 79 year old woman with a history of symptomatic bradycardia and hypertension, status post permanent pacemaker insertion. She denies chest pain or shortness of breath. She remains active, singing in the choir. She continues to garden. She denies syncope or peripheral edema. Allergies  Allergen Reactions  . Horse-Derived Products Other (See Comments)    Reaction unknown  . Penicillins Other (See Comments)    Reaction unknown     Current Outpatient Prescriptions  Medication Sig Dispense Refill  . amLODipine (NORVASC) 5 MG tablet Take 1 tablet (5 mg total) by mouth daily.  30 tablet  0  . aspirin EC 325 MG tablet Take 325 mg by mouth daily.       . calcium-vitamin D (OSCAL WITH D) 500-200 MG-UNIT per tablet Take 1 tablet by mouth daily.       . enalapril (VASOTEC) 5 MG tablet Take 5 mg by mouth daily.        . fish oil-omega-3 fatty acids 1000 MG capsule Take 1 g by mouth daily.       Marland Kitchen omeprazole (PRILOSEC) 20 MG capsule Take 20 mg by mouth daily.        No current facility-administered medications for this visit.     Past Medical History  Diagnosis Date  . Bradycardia   . Arthritis   . Hypertension   . GERD (gastroesophageal reflux disease)   . Hypercholesterolemia   . Pacemaker   . Hiatal hernia     ROS:   All systems reviewed and negative except as noted in the HPI.   Past Surgical History  Procedure Laterality Date  . Pacemaker insertion    . Tonsillectomy    . Appendectomy    . Cesarean section       Family History  Problem Relation Age of Onset  . Heart failure Mother   . Diabetes Father   . Heart attack Father      History   Social History  . Marital Status: Widowed    Spouse Name: N/A    Number of Children: N/A  . Years of Education: N/A   Occupational History  . Not on file.   Social History Main Topics  . Smoking status: Former Smoker    Types: Cigarettes    Quit date:  06/30/1981  . Smokeless tobacco: Not on file  . Alcohol Use: No  . Drug Use: No  . Sexually Active: No   Other Topics Concern  . Not on file   Social History Narrative  . No narrative on file     BP 128/75  Pulse 64  Ht 4\' 11"  (1.499 m)  Wt 134 lb 3.2 oz (60.873 kg)  BMI 27.09 kg/m2  Physical Exam:  Well appearing NAD HEENT: Unremarkable Neck:  No JVD, no thyromegally Lungs:  Clear with no wheezes, rales, or rhonchi. HEART:  Regular rate rhythm, no murmurs, no rubs, no clicks Abd:  soft, positive bowel sounds, no organomegally, no rebound, no guarding Ext:  2 plus pulses, no edema, no cyanosis, no clubbing Skin:  No rashes no nodules Neuro:  CN II through XII intact, motor grossly intact  DEVICE  Normal device function.  See PaceArt for details.   Assess/Plan:

## 2012-09-26 NOTE — Assessment & Plan Note (Signed)
Her blood pressure is well controlled. She will continue her current medical therapy. A faster maintain a low-sodium diet.

## 2012-09-26 NOTE — Patient Instructions (Signed)
Remote monitoring is used to monitor your Pacemaker of ICD from home. This monitoring reduces the number of office visits required to check your device to one time per year. It allows us to keep an eye on the functioning of your device to ensure it is working properly. You are scheduled for a device check from home on 12/30/12. You may send your transmission at any time that day. If you have a wireless device, the transmission will be sent automatically. After your physician reviews your transmission, you will receive a postcard with your next transmission date.  Your physician wants you to follow-up in: 1 year with Dr. Taylor. You will receive a reminder letter in the mail two months in advance. If you don't receive a letter, please call our office to schedule the follow-up appointment.  Your physician recommends that you continue on your current medications as directed. Please refer to the Current Medication list given to you today.   

## 2012-09-26 NOTE — Assessment & Plan Note (Signed)
Her Medtronic dual-chamber pacemaker is working normally. There have been no ventricular or atrial arrhythmias. We'll recheck in several months.

## 2012-12-30 ENCOUNTER — Ambulatory Visit (INDEPENDENT_AMBULATORY_CARE_PROVIDER_SITE_OTHER): Payer: Medicare Other | Admitting: *Deleted

## 2012-12-30 DIAGNOSIS — I495 Sick sinus syndrome: Secondary | ICD-10-CM

## 2012-12-30 DIAGNOSIS — Z950 Presence of cardiac pacemaker: Secondary | ICD-10-CM

## 2012-12-31 LAB — REMOTE PACEMAKER DEVICE
AL AMPLITUDE: 2.8 mv
AL IMPEDENCE PM: 424 Ohm
ATRIAL PACING PM: 51
BATTERY VOLTAGE: 2.79 V
RV LEAD IMPEDENCE PM: 700 Ohm
RV LEAD THRESHOLD: 0.875 V
VENTRICULAR PACING PM: 0

## 2013-01-07 ENCOUNTER — Encounter: Payer: Self-pay | Admitting: *Deleted

## 2013-01-15 ENCOUNTER — Encounter: Payer: Self-pay | Admitting: Internal Medicine

## 2013-03-29 ENCOUNTER — Other Ambulatory Visit: Payer: Self-pay | Admitting: Internal Medicine

## 2013-03-31 ENCOUNTER — Other Ambulatory Visit: Payer: Self-pay

## 2013-03-31 MED ORDER — AMLODIPINE BESYLATE 5 MG PO TABS: ORAL_TABLET | ORAL | Status: DC

## 2013-04-07 ENCOUNTER — Ambulatory Visit (INDEPENDENT_AMBULATORY_CARE_PROVIDER_SITE_OTHER): Payer: Medicare Other | Admitting: *Deleted

## 2013-04-07 ENCOUNTER — Encounter: Payer: Self-pay | Admitting: Internal Medicine

## 2013-04-07 DIAGNOSIS — Z950 Presence of cardiac pacemaker: Secondary | ICD-10-CM

## 2013-04-07 DIAGNOSIS — I495 Sick sinus syndrome: Secondary | ICD-10-CM

## 2013-04-07 DIAGNOSIS — I498 Other specified cardiac arrhythmias: Secondary | ICD-10-CM

## 2013-04-11 LAB — REMOTE PACEMAKER DEVICE
AL THRESHOLD: 0.5 V
ATRIAL PACING PM: 62
BAMS-0001: 175 {beats}/min
RV LEAD AMPLITUDE: 22.4 mv
RV LEAD THRESHOLD: 0.75 V

## 2013-04-15 ENCOUNTER — Encounter: Payer: Self-pay | Admitting: *Deleted

## 2013-07-14 ENCOUNTER — Encounter: Payer: Medicare Other | Admitting: *Deleted

## 2013-07-14 ENCOUNTER — Encounter: Payer: Self-pay | Admitting: Internal Medicine

## 2013-07-14 DIAGNOSIS — Z950 Presence of cardiac pacemaker: Secondary | ICD-10-CM

## 2013-07-14 DIAGNOSIS — I495 Sick sinus syndrome: Secondary | ICD-10-CM

## 2013-07-14 LAB — MDC_IDC_ENUM_SESS_TYPE_REMOTE
Battery Remaining Longevity: 123 mo
Battery Voltage: 2.79 V
Brady Statistic AP VP Percent: 1 %
Brady Statistic AS VP Percent: 0 %
Lead Channel Pacing Threshold Amplitude: 0.375 V
Lead Channel Sensing Intrinsic Amplitude: 1 mV
Lead Channel Sensing Intrinsic Amplitude: 5.6 mV
Lead Channel Setting Pacing Amplitude: 2 V
Lead Channel Setting Pacing Amplitude: 2.5 V
Lead Channel Setting Pacing Pulse Width: 0.4 ms
MDC IDC MSMT BATTERY IMPEDANCE: 201 Ohm
MDC IDC MSMT LEADCHNL RA IMPEDANCE VALUE: 413 Ohm
MDC IDC MSMT LEADCHNL RA PACING THRESHOLD PULSEWIDTH: 0.4 ms
MDC IDC MSMT LEADCHNL RV IMPEDANCE VALUE: 652 Ohm
MDC IDC MSMT LEADCHNL RV PACING THRESHOLD AMPLITUDE: 0.875 V
MDC IDC MSMT LEADCHNL RV PACING THRESHOLD PULSEWIDTH: 0.4 ms
MDC IDC SESS DTM: 20150112141059
MDC IDC SET LEADCHNL RV SENSING SENSITIVITY: 4 mV
MDC IDC STAT BRADY AP VS PERCENT: 65 %
MDC IDC STAT BRADY AS VS PERCENT: 34 %

## 2013-07-17 ENCOUNTER — Encounter: Payer: Self-pay | Admitting: *Deleted

## 2013-09-09 ENCOUNTER — Encounter: Payer: Self-pay | Admitting: Internal Medicine

## 2013-09-09 ENCOUNTER — Ambulatory Visit (INDEPENDENT_AMBULATORY_CARE_PROVIDER_SITE_OTHER): Payer: Medicare Other | Admitting: Internal Medicine

## 2013-09-09 VITALS — BP 128/70 | HR 85 | Ht 59.0 in | Wt 136.0 lb

## 2013-09-09 DIAGNOSIS — Z950 Presence of cardiac pacemaker: Secondary | ICD-10-CM

## 2013-09-09 DIAGNOSIS — I10 Essential (primary) hypertension: Secondary | ICD-10-CM

## 2013-09-09 DIAGNOSIS — I495 Sick sinus syndrome: Secondary | ICD-10-CM

## 2013-09-09 LAB — MDC_IDC_ENUM_SESS_TYPE_INCLINIC
Brady Statistic AP VP Percent: 1 %
Brady Statistic AP VS Percent: 66 %
Brady Statistic AS VP Percent: 0 %
Brady Statistic AS VS Percent: 33 %
Lead Channel Impedance Value: 650 Ohm
Lead Channel Pacing Threshold Amplitude: 0.5 V
Lead Channel Pacing Threshold Amplitude: 0.75 V
Lead Channel Sensing Intrinsic Amplitude: 2.8 mV
Lead Channel Sensing Intrinsic Amplitude: 8 mV
Lead Channel Setting Pacing Amplitude: 2 V
MDC IDC MSMT BATTERY IMPEDANCE: 224 Ohm
MDC IDC MSMT BATTERY REMAINING LONGEVITY: 119 mo
MDC IDC MSMT BATTERY VOLTAGE: 2.79 V
MDC IDC MSMT LEADCHNL RA IMPEDANCE VALUE: 420 Ohm
MDC IDC MSMT LEADCHNL RA PACING THRESHOLD PULSEWIDTH: 0.4 ms
MDC IDC MSMT LEADCHNL RV PACING THRESHOLD PULSEWIDTH: 0.4 ms
MDC IDC SESS DTM: 20150310095748
MDC IDC SET LEADCHNL RV PACING AMPLITUDE: 2.5 V
MDC IDC SET LEADCHNL RV PACING PULSEWIDTH: 0.4 ms
MDC IDC SET LEADCHNL RV SENSING SENSITIVITY: 2.8 mV

## 2013-09-09 NOTE — Progress Notes (Signed)
HPI Mrs. Montez MoritaCarter returns today for followup. She is a very pleasant 80 year old woman with a history of symptomatic bradycardia and hypertension, status post permanent pacemaker insertion. She denies chest pain or shortness of breath. She remains active, singing in the choir. She continues to garden and mows her lawn. She denies syncope or peripheral edema. Allergies  Allergen Reactions  . Horse-Derived Products Other (See Comments)    Reaction unknown  . Penicillins Other (See Comments)    Reaction unknown     Current Outpatient Prescriptions  Medication Sig Dispense Refill  . amLODipine (NORVASC) 5 MG tablet TAKE ONE TABLET BY MOUTH ONE   TIME DAILY  30 tablet  12  . aspirin EC 325 MG tablet Take 325 mg by mouth daily.       . calcium-vitamin D (OSCAL WITH D) 500-200 MG-UNIT per tablet Take 1 tablet by mouth daily.       . enalapril (VASOTEC) 5 MG tablet Take 5 mg by mouth daily.        . fish oil-omega-3 fatty acids 1000 MG capsule Take 1 g by mouth daily.       Marland Kitchen. omeprazole (PRILOSEC) 20 MG capsule Take 20 mg by mouth daily.        No current facility-administered medications for this visit.     Past Medical History  Diagnosis Date  . Bradycardia   . Arthritis   . Hypertension   . GERD (gastroesophageal reflux disease)   . Hypercholesterolemia   . Pacemaker   . Hiatal hernia     ROS:   All systems reviewed and negative except as noted in the HPI.   Past Surgical History  Procedure Laterality Date  . Pacemaker insertion    . Tonsillectomy    . Appendectomy    . Cesarean section       Family History  Problem Relation Age of Onset  . Heart failure Mother   . Diabetes Father   . Heart attack Father      History   Social History  . Marital Status: Widowed    Spouse Name: N/A    Number of Children: N/A  . Years of Education: N/A   Occupational History  . Not on file.   Social History Main Topics  . Smoking status: Former Smoker    Types: Cigarettes     Quit date: 06/30/1981  . Smokeless tobacco: Not on file  . Alcohol Use: No  . Drug Use: No  . Sexual Activity: No   Other Topics Concern  . Not on file   Social History Narrative  . No narrative on file     BP 128/70  Pulse 85  Ht 4\' 11"  (1.499 m)  Wt 136 lb (61.689 kg)  BMI 27.45 kg/m2  Physical Exam:  Well appearing 80 yo man,NAD HEENT: Unremarkable Neck:  No JVD, no thyromegally Lungs:  Clear with no wheezes, rales, or rhonchi. HEART:  Regular rate rhythm, no murmurs, no rubs, no clicks Abd:  soft, positive bowel sounds, no organomegally, no rebound, no guarding Ext:  2 plus pulses, no edema, no cyanosis, no clubbing Skin:  No rashes no nodules Neuro:  CN II through XII intact, motor grossly intact  DEVICE  Normal device function.  See PaceArt for details.   Assess/Plan:

## 2013-09-09 NOTE — Patient Instructions (Signed)

## 2013-09-09 NOTE — Assessment & Plan Note (Signed)
Her Medtronic DDD PM is working normally. Will recheck in several months. 

## 2013-09-09 NOTE — Assessment & Plan Note (Signed)
Her blood pressure is controlled. She will continue her current meds.  

## 2013-12-11 ENCOUNTER — Telehealth: Payer: Self-pay | Admitting: Cardiology

## 2013-12-11 ENCOUNTER — Ambulatory Visit (INDEPENDENT_AMBULATORY_CARE_PROVIDER_SITE_OTHER): Payer: Medicare Other | Admitting: *Deleted

## 2013-12-11 DIAGNOSIS — Z950 Presence of cardiac pacemaker: Secondary | ICD-10-CM

## 2013-12-11 DIAGNOSIS — I495 Sick sinus syndrome: Secondary | ICD-10-CM

## 2013-12-11 DIAGNOSIS — I498 Other specified cardiac arrhythmias: Secondary | ICD-10-CM

## 2013-12-11 LAB — MDC_IDC_ENUM_SESS_TYPE_REMOTE
Battery Impedance: 224 Ohm
Battery Remaining Longevity: 120 mo
Battery Voltage: 2.79 V
Brady Statistic AP VP Percent: 1 %
Lead Channel Impedance Value: 413 Ohm
Lead Channel Sensing Intrinsic Amplitude: 1 mV
Lead Channel Sensing Intrinsic Amplitude: 5.6 mV
Lead Channel Setting Pacing Amplitude: 2 V
Lead Channel Setting Pacing Amplitude: 2.5 V
Lead Channel Setting Pacing Pulse Width: 0.4 ms
MDC IDC MSMT LEADCHNL RA PACING THRESHOLD AMPLITUDE: 0.375 V
MDC IDC MSMT LEADCHNL RA PACING THRESHOLD PULSEWIDTH: 0.4 ms
MDC IDC MSMT LEADCHNL RV IMPEDANCE VALUE: 721 Ohm
MDC IDC MSMT LEADCHNL RV PACING THRESHOLD AMPLITUDE: 0.875 V
MDC IDC MSMT LEADCHNL RV PACING THRESHOLD PULSEWIDTH: 0.4 ms
MDC IDC SESS DTM: 20150611152321
MDC IDC SET LEADCHNL RV SENSING SENSITIVITY: 2.8 mV
MDC IDC STAT BRADY AP VS PERCENT: 64 %
MDC IDC STAT BRADY AS VP PERCENT: 0 %
MDC IDC STAT BRADY AS VS PERCENT: 34 %

## 2013-12-11 NOTE — Telephone Encounter (Signed)
Attempted to call pt x1 to remind pt of remote transmission that is due today.

## 2013-12-11 NOTE — Progress Notes (Signed)
Remote pacemaker transmission.   

## 2014-01-07 ENCOUNTER — Encounter: Payer: Self-pay | Admitting: Cardiology

## 2014-01-13 ENCOUNTER — Encounter: Payer: Self-pay | Admitting: Internal Medicine

## 2014-03-16 ENCOUNTER — Ambulatory Visit (INDEPENDENT_AMBULATORY_CARE_PROVIDER_SITE_OTHER): Payer: Medicare Other | Admitting: *Deleted

## 2014-03-16 ENCOUNTER — Telehealth: Payer: Self-pay | Admitting: Cardiology

## 2014-03-16 DIAGNOSIS — I495 Sick sinus syndrome: Secondary | ICD-10-CM

## 2014-03-16 LAB — MDC_IDC_ENUM_SESS_TYPE_REMOTE
Brady Statistic AP VP Percent: 1 %
Brady Statistic AS VP Percent: 0 %
Brady Statistic AS VS Percent: 30 %
Lead Channel Impedance Value: 585 Ohm
Lead Channel Pacing Threshold Amplitude: 0.875 V
Lead Channel Sensing Intrinsic Amplitude: 1 mV
Lead Channel Sensing Intrinsic Amplitude: 5.6 mV
Lead Channel Setting Pacing Amplitude: 2 V
MDC IDC MSMT BATTERY IMPEDANCE: 247 Ohm
MDC IDC MSMT BATTERY REMAINING LONGEVITY: 115 mo
MDC IDC MSMT BATTERY VOLTAGE: 2.79 V
MDC IDC MSMT LEADCHNL RA IMPEDANCE VALUE: 414 Ohm
MDC IDC MSMT LEADCHNL RA PACING THRESHOLD AMPLITUDE: 0.5 V
MDC IDC MSMT LEADCHNL RA PACING THRESHOLD PULSEWIDTH: 0.4 ms
MDC IDC MSMT LEADCHNL RV PACING THRESHOLD PULSEWIDTH: 0.4 ms
MDC IDC SESS DTM: 20150914182628
MDC IDC SET LEADCHNL RV PACING AMPLITUDE: 2.5 V
MDC IDC SET LEADCHNL RV PACING PULSEWIDTH: 0.4 ms
MDC IDC SET LEADCHNL RV SENSING SENSITIVITY: 2.8 mV
MDC IDC STAT BRADY AP VS PERCENT: 69 %

## 2014-03-16 NOTE — Telephone Encounter (Signed)
Spoke with pt and reminded pt of remote transmission that is due today. Pt verbalized understanding.   

## 2014-03-16 NOTE — Progress Notes (Signed)
Remote pacemaker transmission.   

## 2014-03-24 ENCOUNTER — Encounter: Payer: Self-pay | Admitting: Cardiology

## 2014-03-25 ENCOUNTER — Encounter: Payer: Self-pay | Admitting: Internal Medicine

## 2014-04-19 ENCOUNTER — Other Ambulatory Visit: Payer: Self-pay | Admitting: Internal Medicine

## 2014-06-16 ENCOUNTER — Encounter: Payer: Self-pay | Admitting: Internal Medicine

## 2014-06-16 ENCOUNTER — Ambulatory Visit (INDEPENDENT_AMBULATORY_CARE_PROVIDER_SITE_OTHER): Payer: Medicare Other | Admitting: *Deleted

## 2014-06-16 DIAGNOSIS — I495 Sick sinus syndrome: Secondary | ICD-10-CM

## 2014-06-16 NOTE — Progress Notes (Signed)
Remote pacemaker transmission.   

## 2014-06-17 LAB — MDC_IDC_ENUM_SESS_TYPE_REMOTE
Battery Impedance: 271 Ohm
Battery Remaining Longevity: 113 mo
Brady Statistic AP VP Percent: 1 %
Lead Channel Impedance Value: 424 Ohm
Lead Channel Sensing Intrinsic Amplitude: 1.4 mV
Lead Channel Setting Pacing Amplitude: 2 V
Lead Channel Setting Pacing Pulse Width: 0.4 ms
MDC IDC MSMT BATTERY VOLTAGE: 2.79 V
MDC IDC MSMT LEADCHNL RA PACING THRESHOLD AMPLITUDE: 0.5 V
MDC IDC MSMT LEADCHNL RA PACING THRESHOLD PULSEWIDTH: 0.4 ms
MDC IDC MSMT LEADCHNL RV IMPEDANCE VALUE: 759 Ohm
MDC IDC MSMT LEADCHNL RV PACING THRESHOLD AMPLITUDE: 0.625 V
MDC IDC MSMT LEADCHNL RV PACING THRESHOLD PULSEWIDTH: 0.4 ms
MDC IDC MSMT LEADCHNL RV SENSING INTR AMPL: 5.6 mV
MDC IDC SESS DTM: 20151215163242
MDC IDC SET LEADCHNL RV PACING AMPLITUDE: 2.5 V
MDC IDC SET LEADCHNL RV SENSING SENSITIVITY: 2.8 mV
MDC IDC STAT BRADY AP VS PERCENT: 67 %
MDC IDC STAT BRADY AS VP PERCENT: 0 %
MDC IDC STAT BRADY AS VS PERCENT: 32 %

## 2014-06-19 ENCOUNTER — Encounter: Payer: Self-pay | Admitting: Cardiology

## 2014-07-17 DIAGNOSIS — M25562 Pain in left knee: Secondary | ICD-10-CM | POA: Insufficient documentation

## 2014-07-17 DIAGNOSIS — M25569 Pain in unspecified knee: Secondary | ICD-10-CM | POA: Insufficient documentation

## 2014-08-10 ENCOUNTER — Encounter: Payer: Self-pay | Admitting: Internal Medicine

## 2014-09-15 ENCOUNTER — Ambulatory Visit (INDEPENDENT_AMBULATORY_CARE_PROVIDER_SITE_OTHER): Payer: Medicare Other | Admitting: Internal Medicine

## 2014-09-15 ENCOUNTER — Encounter: Payer: Self-pay | Admitting: Internal Medicine

## 2014-09-15 VITALS — BP 130/62 | HR 65 | Ht 59.0 in | Wt 138.0 lb

## 2014-09-15 DIAGNOSIS — I10 Essential (primary) hypertension: Secondary | ICD-10-CM

## 2014-09-15 DIAGNOSIS — I495 Sick sinus syndrome: Secondary | ICD-10-CM

## 2014-09-15 DIAGNOSIS — Z950 Presence of cardiac pacemaker: Secondary | ICD-10-CM

## 2014-09-15 LAB — MDC_IDC_ENUM_SESS_TYPE_INCLINIC
Battery Impedance: 271 Ohm
Battery Voltage: 2.79 V
Brady Statistic AP VP Percent: 0 %
Lead Channel Impedance Value: 409 Ohm
Lead Channel Pacing Threshold Amplitude: 0.5 V
Lead Channel Pacing Threshold Pulse Width: 0.4 ms
Lead Channel Pacing Threshold Pulse Width: 0.4 ms
Lead Channel Setting Pacing Amplitude: 2 V
Lead Channel Setting Pacing Amplitude: 2.5 V
MDC IDC MSMT BATTERY REMAINING LONGEVITY: 113 mo
MDC IDC MSMT LEADCHNL RA PACING THRESHOLD AMPLITUDE: 0.5 V
MDC IDC MSMT LEADCHNL RA SENSING INTR AMPL: 4 mV
MDC IDC MSMT LEADCHNL RV IMPEDANCE VALUE: 636 Ohm
MDC IDC MSMT LEADCHNL RV SENSING INTR AMPL: 8 mV
MDC IDC SESS DTM: 20160315130743
MDC IDC SET LEADCHNL RV PACING PULSEWIDTH: 0.4 ms
MDC IDC SET LEADCHNL RV SENSING SENSITIVITY: 2.8 mV
MDC IDC STAT BRADY AP VS PERCENT: 63 %
MDC IDC STAT BRADY AS VP PERCENT: 0 %
MDC IDC STAT BRADY AS VS PERCENT: 37 %

## 2014-09-15 NOTE — Assessment & Plan Note (Signed)
Her heart rates are now good, s/p PPM insertion. Will follow.

## 2014-09-15 NOTE — Progress Notes (Signed)
HPI Mrs. Montez MoritaCarter returns today for followup. She is a very pleasant 81 year old woman with a history of symptomatic bradycardia and hypertension, status post permanent pacemaker insertion. She denies chest pain or shortness of breath. She remains active, singing in the choir. She denies syncope or peripheral edema. Allergies  Allergen Reactions  . Horse-Derived Products Other (See Comments)    Reaction unknown  . Penicillins Other (See Comments)    Reaction unknown     Current Outpatient Prescriptions  Medication Sig Dispense Refill  . amLODipine (NORVASC) 5 MG tablet TAKE ONE TABLET BY MOUTH ONE TIME DAILY 30 tablet 5  . aspirin EC 325 MG tablet Take 325 mg by mouth daily.     . calcium-vitamin D (OSCAL WITH D) 500-200 MG-UNIT per tablet Take 1 tablet by mouth daily.     . enalapril (VASOTEC) 5 MG tablet Take 5 mg by mouth daily.      . fish oil-omega-3 fatty acids 1000 MG capsule Take 1 g by mouth daily.     Marland Kitchen. omeprazole (PRILOSEC) 20 MG capsule Take 20 mg by mouth daily.      No current facility-administered medications for this visit.     Past Medical History  Diagnosis Date  . Bradycardia   . Arthritis   . Hypertension   . GERD (gastroesophageal reflux disease)   . Hypercholesterolemia   . Pacemaker   . Hiatal hernia     ROS:   All systems reviewed and negative except as noted in the HPI.   Past Surgical History  Procedure Laterality Date  . Pacemaker insertion    . Tonsillectomy    . Appendectomy    . Cesarean section       Family History  Problem Relation Age of Onset  . Heart failure Mother   . Diabetes Father   . Heart attack Father      History   Social History  . Marital Status: Widowed    Spouse Name: N/A  . Number of Children: N/A  . Years of Education: N/A   Occupational History  . Not on file.   Social History Main Topics  . Smoking status: Former Smoker    Types: Cigarettes    Quit date: 06/30/1981  . Smokeless tobacco: Not on  file  . Alcohol Use: No  . Drug Use: No  . Sexual Activity: No   Other Topics Concern  . Not on file   Social History Narrative     BP 130/62 mmHg  Pulse 65  Ht 4\' 11"  (1.499 m)  Wt 138 lb (62.596 kg)  BMI 27.86 kg/m2  Physical Exam:  Well appearing 81 yo man,NAD HEENT: Unremarkable Neck:  No JVD, no thyromegally Lungs:  Clear with no wheezes, rales, or rhonchi. HEART:  Regular rate rhythm, no murmurs, no rubs, no clicks Abd:  soft, positive bowel sounds, no organomegally, no rebound, no guarding Ext:  2 plus pulses, no edema, no cyanosis, no clubbing Skin:  No rashes no nodules Neuro:  CN II through XII intact, motor grossly intact  DEVICE  Normal device function.  See PaceArt for details.   Assess/Plan:

## 2014-09-15 NOTE — Assessment & Plan Note (Signed)
Her medtronic DDD PM is working normally. Will recheck in several months. 

## 2014-09-15 NOTE — Assessment & Plan Note (Signed)
Her blood pressure is well controlled. No change in her meds. 

## 2014-09-15 NOTE — Patient Instructions (Signed)

## 2014-09-30 ENCOUNTER — Other Ambulatory Visit: Payer: Self-pay | Admitting: *Deleted

## 2014-09-30 MED ORDER — AMLODIPINE BESYLATE 5 MG PO TABS: 5.0000 mg | ORAL_TABLET | Freq: Every day | ORAL | Status: DC

## 2014-12-15 ENCOUNTER — Telehealth: Payer: Self-pay | Admitting: Internal Medicine

## 2014-12-15 ENCOUNTER — Ambulatory Visit (INDEPENDENT_AMBULATORY_CARE_PROVIDER_SITE_OTHER): Payer: Medicare Other | Admitting: *Deleted

## 2014-12-15 DIAGNOSIS — I495 Sick sinus syndrome: Secondary | ICD-10-CM

## 2014-12-15 NOTE — Telephone Encounter (Signed)
Spoke w/ pt daughter and she stated that she called Medtronic and received help sending transmission.

## 2014-12-15 NOTE — Telephone Encounter (Signed)
Pt called req a call back for furhter instructions on how to set up the remote device. Please

## 2014-12-15 NOTE — Progress Notes (Signed)
Remote pacemaker transmission.   

## 2014-12-19 LAB — CUP PACEART REMOTE DEVICE CHECK
Brady Statistic AP VS Percent: 60 %
Brady Statistic AS VP Percent: 0 %
Lead Channel Pacing Threshold Amplitude: 0.5 V
Lead Channel Pacing Threshold Amplitude: 1 V
Lead Channel Pacing Threshold Pulse Width: 0.4 ms
Lead Channel Sensing Intrinsic Amplitude: 5.6 mV
MDC IDC MSMT BATTERY IMPEDANCE: 295 Ohm
MDC IDC MSMT BATTERY REMAINING LONGEVITY: 110 mo
MDC IDC MSMT BATTERY VOLTAGE: 2.79 V
MDC IDC MSMT LEADCHNL RA IMPEDANCE VALUE: 403 Ohm
MDC IDC MSMT LEADCHNL RA SENSING INTR AMPL: 1 mV
MDC IDC MSMT LEADCHNL RV IMPEDANCE VALUE: 635 Ohm
MDC IDC MSMT LEADCHNL RV PACING THRESHOLD PULSEWIDTH: 0.4 ms
MDC IDC SESS DTM: 20160614161236
MDC IDC SET LEADCHNL RA PACING AMPLITUDE: 2 V
MDC IDC SET LEADCHNL RV PACING AMPLITUDE: 2.5 V
MDC IDC SET LEADCHNL RV PACING PULSEWIDTH: 0.4 ms
MDC IDC SET LEADCHNL RV SENSING SENSITIVITY: 2.8 mV
MDC IDC STAT BRADY AP VP PERCENT: 0 %
MDC IDC STAT BRADY AS VS PERCENT: 40 %

## 2014-12-23 ENCOUNTER — Encounter: Payer: Self-pay | Admitting: Cardiology

## 2014-12-28 ENCOUNTER — Encounter: Payer: Self-pay | Admitting: Internal Medicine

## 2015-01-27 ENCOUNTER — Encounter (HOSPITAL_COMMUNITY): Payer: Self-pay

## 2015-01-27 ENCOUNTER — Emergency Department (HOSPITAL_COMMUNITY)
Admission: EM | Admit: 2015-01-27 | Discharge: 2015-01-27 | Disposition: A | Discharge status: Home / Self Care | Payer: Medicare Other | Attending: Emergency Medicine | Admitting: Emergency Medicine

## 2015-01-27 ENCOUNTER — Emergency Department (HOSPITAL_COMMUNITY): Payer: Medicare Other

## 2015-01-27 DIAGNOSIS — Y9389 Activity, other specified: Secondary | ICD-10-CM | POA: Diagnosis not present

## 2015-01-27 DIAGNOSIS — Z7982 Long term (current) use of aspirin: Secondary | ICD-10-CM | POA: Insufficient documentation

## 2015-01-27 DIAGNOSIS — Z79899 Other long term (current) drug therapy: Secondary | ICD-10-CM | POA: Insufficient documentation

## 2015-01-27 DIAGNOSIS — M1990 Unspecified osteoarthritis, unspecified site: Secondary | ICD-10-CM | POA: Insufficient documentation

## 2015-01-27 DIAGNOSIS — Y92007 Garden or yard of unspecified non-institutional (private) residence as the place of occurrence of the external cause: Secondary | ICD-10-CM | POA: Insufficient documentation

## 2015-01-27 DIAGNOSIS — I10 Essential (primary) hypertension: Secondary | ICD-10-CM | POA: Insufficient documentation

## 2015-01-27 DIAGNOSIS — S76011A Strain of muscle, fascia and tendon of right hip, initial encounter: Secondary | ICD-10-CM | POA: Insufficient documentation

## 2015-01-27 DIAGNOSIS — Z950 Presence of cardiac pacemaker: Secondary | ICD-10-CM | POA: Insufficient documentation

## 2015-01-27 DIAGNOSIS — X58XXXA Exposure to other specified factors, initial encounter: Secondary | ICD-10-CM | POA: Insufficient documentation

## 2015-01-27 DIAGNOSIS — Z880 Allergy status to penicillin: Secondary | ICD-10-CM | POA: Diagnosis not present

## 2015-01-27 DIAGNOSIS — Z8639 Personal history of other endocrine, nutritional and metabolic disease: Secondary | ICD-10-CM | POA: Insufficient documentation

## 2015-01-27 DIAGNOSIS — Z87891 Personal history of nicotine dependence: Secondary | ICD-10-CM | POA: Insufficient documentation

## 2015-01-27 DIAGNOSIS — Y990 Civilian activity done for income or pay: Secondary | ICD-10-CM | POA: Diagnosis not present

## 2015-01-27 DIAGNOSIS — K219 Gastro-esophageal reflux disease without esophagitis: Secondary | ICD-10-CM | POA: Diagnosis not present

## 2015-01-27 DIAGNOSIS — S79911A Unspecified injury of right hip, initial encounter: Secondary | ICD-10-CM | POA: Diagnosis present

## 2015-01-27 MED ORDER — ACETAMINOPHEN 325 MG PO TABS
650.0000 mg | ORAL_TABLET | Freq: Once | ORAL | Status: AC
  Administered 2015-01-27: 650 mg via ORAL
  Filled 2015-01-27: qty 2

## 2015-01-27 NOTE — Discharge Instructions (Signed)
Take tylenol for pain.   Avoid heavy lifting or yard work.  See your doctor.   Return to ER if you have worsening hip pain, unable to walk.

## 2015-01-27 NOTE — ED Notes (Signed)
Bed: WA21 Expected date:  Expected time:  Means of arrival:  Comments: EMS-hip pain 

## 2015-01-27 NOTE — ED Notes (Signed)
She c/o non-traumatic right hip pain.  She cites "yard work" yesterday.  She arrives in no distress and is oriented x 4 with clear speech.

## 2015-01-27 NOTE — ED Provider Notes (Signed)
CSN: [id number]     Arrival date & time 01/27/15  5621 History   First MD Initiated Contact with Patient 01/27/15 [phone]     Chief Complaint  Patient presents with  . Hip Pain     (Consider location/radiation/quality/duration/timing/severity/associated sxs/prior Treatment) The history is provided by the patient.  Kelsey Thompson is a 81 y.o. female hx HTN, GERD, arthritis here with R hip pain. Patient was doing some yard work yesterday and has worsening right hip pain. Denies any fall or injury. Denies any numbness or weakness. Lives at home with daughter.    Past Medical History  Diagnosis Date  . Bradycardia   . Arthritis   . Hypertension   . GERD (gastroesophageal reflux disease)   . Hypercholesterolemia   . Pacemaker   . Hiatal hernia    Past Surgical History  Procedure Laterality Date  . Pacemaker insertion    . Tonsillectomy    . Appendectomy    . Cesarean section     Family History  Problem Relation Age of Onset  . Heart failure Mother   . Diabetes Father   . Heart attack Father    History  Substance Use Topics  . Smoking status: Former Smoker    Types: Cigarettes    Quit date: 06/30/1981  . Smokeless tobacco: Not on file  . Alcohol Use: No   OB History    No data available     Review of Systems  Musculoskeletal:       R hip pain   All other systems reviewed and are negative.     Allergies  Horse-derived products and Penicillins  Home Medications   Prior to Admission medications   Medication Sig Start Date End Date Taking? Authorizing Provider  amLODipine (NORVASC) 5 MG tablet Take 1 tablet (5 mg total) by mouth daily. 09/30/14  Yes Marinus Maw, MD  aspirin EC 325 MG tablet Take 325 mg by mouth daily.    Yes Historical Provider, MD  calcium-vitamin D (OSCAL WITH D) 500-200 MG-UNIT per tablet Take 1 tablet by mouth daily.    Yes Historical Provider, MD  enalapril (VASOTEC) 5 MG tablet Take 5 mg by mouth daily. 12/01/14  Yes Historical Provider,  MD  fish oil-omega-3 fatty acids 1000 MG capsule Take 1 g by mouth daily.    Yes Historical Provider, MD  omeprazole (PRILOSEC) 20 MG capsule Take 20 mg by mouth daily.    Yes Historical Provider, MD   BP 134/80 mmHg  Pulse 65  Temp(Src) 97.9 F (36.6 C) (Oral)  Resp 16  SpO2 99% Physical Exam  Constitutional: She is oriented to person, place, and time.  Chronically ill, well appearing for age, NAD   HENT:  Head: Normocephalic and atraumatic.  Mouth/Throat: Oropharynx is clear and moist.  Eyes: Conjunctivae are normal. Pupils are equal, round, and reactive to light.  Neck: Normal range of motion.  Cardiovascular: Normal rate, regular rhythm and normal heart sounds.   Pulmonary/Chest: Effort normal and breath sounds normal. No respiratory distress. She has no wheezes.  Abdominal: Soft. Bowel sounds are normal. She exhibits no distension. There is no tenderness. There is no rebound.  Musculoskeletal:  R hip minimal tenderness, nl ROM. No obvious deformity. 2+ peripheral pulses. No pedal edema   Neurological: She is alert and oriented to person, place, and time.  Neg straight leg raise, neurovascular intact, no saddle anesthesia   Skin: Skin is warm and dry.  Psychiatric: She has a normal mood  and affect. Her behavior is normal. Judgment and thought content normal.  Nursing note and vitals reviewed.   ED Course  Procedures (including critical care time) Labs Review Labs Reviewed - No data to display  Imaging Review Dg Hip Unilat With Pelvis 2-3 Views Right  01/27/2015   CLINICAL DATA:  Right-sided hip pain no known injury, initial encounter  EXAM: DG HIP (WITH OR WITHOUT PELVIS) 2-3V RIGHT  COMPARISON:  None.  FINDINGS: Mild degenerative changes of the hip joints are noted bilaterally. No acute fracture or dislocation is seen. The pelvic ring is intact. No soft tissue abnormality is noted.  IMPRESSION: Degenerative change without acute abnormality.   Electronically Signed   By: Alcide Clever M.D.   On: 01/27/2015 10:17     EKG Interpretation None      MDM   Final diagnoses:  None   Kelsey Thompson is a 81 y.o. female here with R hip pain. Xray showed no fracture, likely strain. Neurovascular intact. Ambulated in the ED. Will dc home with tylenol prn.      Richardean Canal, MD 01/27/15 1146

## 2015-01-27 NOTE — ED Notes (Signed)
She remains in no distress and has just ambulated to b.r. And back without assistance.

## 2015-03-16 ENCOUNTER — Telehealth: Payer: Self-pay | Admitting: Cardiology

## 2015-03-16 ENCOUNTER — Ambulatory Visit (INDEPENDENT_AMBULATORY_CARE_PROVIDER_SITE_OTHER): Payer: Medicare Other | Admitting: *Deleted

## 2015-03-16 ENCOUNTER — Encounter: Payer: Self-pay | Admitting: Internal Medicine

## 2015-03-16 DIAGNOSIS — I495 Sick sinus syndrome: Secondary | ICD-10-CM | POA: Diagnosis not present

## 2015-03-16 NOTE — Telephone Encounter (Signed)
LMOVM reminding pt to send remote transmission.   

## 2015-03-16 NOTE — Progress Notes (Signed)
Remote pacemaker transmission.   

## 2015-03-25 LAB — CUP PACEART REMOTE DEVICE CHECK
Battery Impedance: 318 Ohm
Battery Voltage: 2.79 V
Brady Statistic AP VP Percent: 0 %
Brady Statistic AP VS Percent: 66 %
Brady Statistic AS VP Percent: 0 %
Brady Statistic AS VS Percent: 34 %
Lead Channel Impedance Value: 635 Ohm
Lead Channel Pacing Threshold Amplitude: 0.5 V
Lead Channel Pacing Threshold Pulse Width: 0.4 ms
Lead Channel Sensing Intrinsic Amplitude: 5.6 mV
Lead Channel Setting Pacing Amplitude: 2 V
Lead Channel Setting Pacing Pulse Width: 0.4 ms
MDC IDC MSMT BATTERY REMAINING LONGEVITY: 106 mo
MDC IDC MSMT LEADCHNL RA IMPEDANCE VALUE: 393 Ohm
MDC IDC MSMT LEADCHNL RA PACING THRESHOLD PULSEWIDTH: 0.4 ms
MDC IDC MSMT LEADCHNL RA SENSING INTR AMPL: 1.4 mV
MDC IDC MSMT LEADCHNL RV PACING THRESHOLD AMPLITUDE: 0.875 V
MDC IDC SESS DTM: 20160913154635
MDC IDC SET LEADCHNL RV PACING AMPLITUDE: 2.5 V
MDC IDC SET LEADCHNL RV SENSING SENSITIVITY: 2.8 mV

## 2015-04-09 ENCOUNTER — Encounter: Payer: Self-pay | Admitting: Cardiology

## 2015-04-26 ENCOUNTER — Other Ambulatory Visit: Payer: Self-pay | Admitting: *Deleted

## 2015-04-26 ENCOUNTER — Telehealth: Payer: Self-pay | Admitting: Internal Medicine

## 2015-04-26 ENCOUNTER — Encounter: Payer: Self-pay | Admitting: *Deleted

## 2015-04-26 MED ORDER — AMLODIPINE BESYLATE 5 MG PO TABS: 5.0000 mg | ORAL_TABLET | Freq: Every day | ORAL | Status: DC

## 2015-04-26 NOTE — Telephone Encounter (Signed)
°  STAT if patient is at the pharmacy , call can be transferred to refill team.   1. Which medications need to be refilled? Amlodipine 5mg    2. Which pharmacy/location is medication to be sent to?[facility] phone # [phone]  3. Do they need a 30 day or 90 day supply? 30 day  Pt's daughter stated she has been trying to get pt's medication for past 2weeks and no one from this office has respond [facility] and is very frustrated. please call pt's daughter.

## 2015-04-26 NOTE — Telephone Encounter (Signed)
Returned Pt's call about Amlodipine 5mg , take once daily by mouth. I assured patient we will [facility] [place] [facility]. It does not show in Epic where [facility] has tried to contact or e-fax rx to our office since March of this year.

## 2015-06-15 ENCOUNTER — Ambulatory Visit (INDEPENDENT_AMBULATORY_CARE_PROVIDER_SITE_OTHER): Payer: Medicare Other | Admitting: *Deleted

## 2015-06-15 DIAGNOSIS — I495 Sick sinus syndrome: Secondary | ICD-10-CM

## 2015-06-16 NOTE — Progress Notes (Signed)
Remote pacemaker transmission.   

## 2015-06-19 LAB — CUP PACEART REMOTE DEVICE CHECK
Battery Impedance: 366 Ohm
Battery Remaining Longevity: 102 mo
Brady Statistic AP VS Percent: 66 %
Brady Statistic AS VS Percent: 33 %
Date Time Interrogation Session: 20161213144617
Implantable Lead Implant Date: 20100215
Implantable Lead Location: 753859
Lead Channel Impedance Value: 420 Ohm
Lead Channel Impedance Value: 754 Ohm
Lead Channel Pacing Threshold Amplitude: 1 V
Lead Channel Pacing Threshold Pulse Width: 0.4 ms
Lead Channel Sensing Intrinsic Amplitude: 1.4 mV
Lead Channel Sensing Intrinsic Amplitude: 5.6 mV — CL
Lead Channel Setting Pacing Amplitude: 2 V
Lead Channel Setting Pacing Pulse Width: 0.4 ms
Lead Channel Setting Sensing Sensitivity: 2.8 mV
MDC IDC LEAD IMPLANT DT: 20100215
MDC IDC LEAD LOCATION: 753860
MDC IDC MSMT BATTERY VOLTAGE: 2.79 V
MDC IDC MSMT LEADCHNL RA PACING THRESHOLD AMPLITUDE: 0.5 V
MDC IDC MSMT LEADCHNL RA PACING THRESHOLD PULSEWIDTH: 0.4 ms
MDC IDC SET LEADCHNL RV PACING AMPLITUDE: 2.5 V
MDC IDC STAT BRADY AP VP PERCENT: 0 %
MDC IDC STAT BRADY AS VP PERCENT: 0 %

## 2015-06-24 ENCOUNTER — Encounter: Payer: Self-pay | Admitting: *Deleted

## 2015-09-22 ENCOUNTER — Encounter: Payer: Medicare Other | Admitting: Internal Medicine

## 2015-09-29 ENCOUNTER — Encounter: Payer: Self-pay | Admitting: Internal Medicine

## 2015-09-29 ENCOUNTER — Ambulatory Visit (INDEPENDENT_AMBULATORY_CARE_PROVIDER_SITE_OTHER): Payer: Medicare Other | Admitting: Internal Medicine

## 2015-09-29 VITALS — BP 140/68 | HR 85 | Ht 60.0 in | Wt 134.2 lb

## 2015-09-29 DIAGNOSIS — I495 Sick sinus syndrome: Secondary | ICD-10-CM | POA: Diagnosis not present

## 2015-09-29 LAB — CUP PACEART INCLINIC DEVICE CHECK
Battery Impedance: 366 Ohm
Battery Remaining Longevity: 102 mo
Battery Voltage: 2.79 V
Brady Statistic AS VS Percent: 32 %
Date Time Interrogation Session: 20170329110606
Implantable Lead Implant Date: 20100215
Implantable Lead Location: 753860
Implantable Lead Model: 5076
Lead Channel Impedance Value: 403 Ohm
Lead Channel Impedance Value: 674 Ohm
Lead Channel Pacing Threshold Amplitude: 0.5 V
Lead Channel Pacing Threshold Pulse Width: 0.4 ms
Lead Channel Sensing Intrinsic Amplitude: 2 mV
Lead Channel Sensing Intrinsic Amplitude: 5.6 mV
Lead Channel Setting Pacing Amplitude: 2 V
Lead Channel Setting Pacing Pulse Width: 0.4 ms
Lead Channel Setting Sensing Sensitivity: 2.8 mV
MDC IDC LEAD IMPLANT DT: 20100215
MDC IDC LEAD LOCATION: 753859
MDC IDC MSMT LEADCHNL RA PACING THRESHOLD AMPLITUDE: 0.5 V
MDC IDC MSMT LEADCHNL RA PACING THRESHOLD PULSEWIDTH: 0.4 ms
MDC IDC SET LEADCHNL RV PACING AMPLITUDE: 2.5 V
MDC IDC STAT BRADY AP VP PERCENT: 0 %
MDC IDC STAT BRADY AP VS PERCENT: 67 %
MDC IDC STAT BRADY AS VP PERCENT: 0 %

## 2015-09-29 NOTE — Progress Notes (Signed)
HPI [name] returns today for followup. She is a very pleasant 82 year old woman with a history of symptomatic bradycardia and hypertension, status post permanent pacemaker insertion. She denies chest pain or shortness of breath. She remains active, singing in the choir. She denies syncope or peripheral edema. She has not been in the hospital since I saw her last. Allergies  Allergen Reactions  . Horse-Derived Products Other (See Comments)    Reaction unknown  . Penicillins Other (See Comments)    Reaction unknown     Current Outpatient Prescriptions  Medication Sig Dispense Refill  . amLODipine (NORVASC) 5 MG tablet Take 1 tablet (5 mg total) by mouth daily. 30 tablet 5  . aspirin EC 325 MG tablet Take 325 mg by mouth daily.     . calcium-vitamin D (OSCAL WITH D) 500-200 MG-UNIT per tablet Take 1 tablet by mouth daily.     . enalapril (VASOTEC) 5 MG tablet Take 5 mg by mouth daily.    . fish oil-omega-3 fatty acids 1000 MG capsule Take 1 g by mouth daily.     Marland Kitchen. omeprazole (PRILOSEC) 20 MG capsule Take 20 mg by mouth daily.      No current facility-administered medications for this visit.     Past Medical History  Diagnosis Date  . Bradycardia   . Arthritis   . Hypertension   . GERD (gastroesophageal reflux disease)   . Hypercholesterolemia   . Pacemaker   . Hiatal hernia     ROS:   All systems reviewed and negative except as noted in the HPI.   Past Surgical History  Procedure Laterality Date  . Pacemaker insertion    . Tonsillectomy    . Appendectomy    . Cesarean section       Family History  Problem Relation Age of Onset  . Heart failure Mother   . Diabetes Father   . Heart attack Father      Social History   Social History  . Marital Status: Widowed    Spouse Name: N/A  . Number of Children: N/A  . Years of Education: N/A   Occupational History  . Not on file.   Social History Main Topics  . Smoking status: Former Smoker    Types:  Cigarettes    Quit date: 06/30/1981  . Smokeless tobacco: Never Used  . Alcohol Use: No  . Drug Use: No  . Sexual Activity: No   Other Topics Concern  . Not on file   Social History Narrative     BP 140/68 mmHg  Pulse 85  Ht 5' (1.524 m)  Wt 134 lb 3.2 oz (60.873 kg)  BMI 26.21 kg/m2  Physical Exam:  Well appearing 82 yo man,NAD HEENT: Unremarkable Neck:  6 cm JVD, no thyromegally Lungs:  Clear with no wheezes, rales, or rhonchi. HEART:  Regular rate rhythm, no murmurs, no rubs, no clicks Abd:  soft, positive bowel sounds, no organomegally, no rebound, no guarding Ext:  2 plus pulses, no edema, no cyanosis, no clubbing Skin:  No rashes no nodules Neuro:  CN II through XII intact, motor grossly intact  ECG - nsr with atrial pacing  DEVICE  Normal device function.  See PaceArt for details.   Assess/Plan: 1. Symptomatic sinus node dysfunction - she is doing well and is asymptomatic after PPM insertion 2. HTN - her blood pressure is minimally elevated. Will follow. 3. PPM - Her Medtronic DDD PM is working normally. Will recheck in several months.  Mikle Bosworth.D.

## 2015-09-29 NOTE — Patient Instructions (Signed)

## 2015-10-20 ENCOUNTER — Other Ambulatory Visit: Payer: Self-pay | Admitting: Internal Medicine

## 2015-12-29 ENCOUNTER — Telehealth: Payer: Self-pay | Admitting: Cardiology

## 2015-12-29 ENCOUNTER — Ambulatory Visit (INDEPENDENT_AMBULATORY_CARE_PROVIDER_SITE_OTHER): Payer: Medicare Other | Admitting: *Deleted

## 2015-12-29 DIAGNOSIS — I495 Sick sinus syndrome: Secondary | ICD-10-CM

## 2015-12-29 NOTE — Progress Notes (Signed)
Remote pacemaker transmission.   

## 2015-12-29 NOTE — Telephone Encounter (Signed)
LMOVM reminding pt to send remote transmission.   

## 2015-12-30 LAB — CUP PACEART REMOTE DEVICE CHECK
Battery Remaining Longevity: 93 mo
Battery Voltage: 2.79 V
Brady Statistic AP VP Percent: 1 %
Brady Statistic AP VS Percent: 78 %
Brady Statistic AS VP Percent: 0 %
Brady Statistic AS VS Percent: 21 %
Date Time Interrogation Session: 20170628162311
Implantable Lead Implant Date: 20100215
Implantable Lead Model: 5076
Implantable Lead Model: 5076
Lead Channel Impedance Value: 408 Ohm
Lead Channel Pacing Threshold Amplitude: 0.5 V
Lead Channel Pacing Threshold Amplitude: 0.75 V
Lead Channel Pacing Threshold Pulse Width: 0.4 ms
Lead Channel Sensing Intrinsic Amplitude: 5.6 mV
Lead Channel Setting Pacing Amplitude: 2 V
Lead Channel Setting Pacing Amplitude: 2.5 V
Lead Channel Setting Sensing Sensitivity: 2.8 mV
MDC IDC LEAD IMPLANT DT: 20100215
MDC IDC LEAD LOCATION: 753859
MDC IDC LEAD LOCATION: 753860
MDC IDC MSMT BATTERY IMPEDANCE: 438 Ohm
MDC IDC MSMT LEADCHNL RA PACING THRESHOLD PULSEWIDTH: 0.4 ms
MDC IDC MSMT LEADCHNL RA SENSING INTR AMPL: 1.4 mV
MDC IDC MSMT LEADCHNL RV IMPEDANCE VALUE: 646 Ohm
MDC IDC SET LEADCHNL RV PACING PULSEWIDTH: 0.4 ms

## 2015-12-31 ENCOUNTER — Encounter: Payer: Self-pay | Admitting: Cardiology

## 2016-03-29 ENCOUNTER — Telehealth: Payer: Self-pay | Admitting: Cardiology

## 2016-03-29 ENCOUNTER — Ambulatory Visit (INDEPENDENT_AMBULATORY_CARE_PROVIDER_SITE_OTHER): Payer: Medicare Other | Admitting: *Deleted

## 2016-03-29 DIAGNOSIS — I495 Sick sinus syndrome: Secondary | ICD-10-CM

## 2016-03-29 NOTE — Progress Notes (Signed)
Remote pacemaker transmission.   

## 2016-03-29 NOTE — Telephone Encounter (Signed)
Confirmed remote transmission w/ pt daughter.   

## 2016-04-05 ENCOUNTER — Encounter: Payer: Self-pay | Admitting: Cardiology

## 2016-04-26 LAB — CUP PACEART REMOTE DEVICE CHECK
Battery Impedance: 438 Ohm
Battery Remaining Longevity: 94 mo
Brady Statistic AP VP Percent: 1 %
Brady Statistic AS VP Percent: 0 %
Brady Statistic AS VS Percent: 24 %
Date Time Interrogation Session: 20170927142655
Implantable Lead Implant Date: 20100215
Implantable Lead Location: 753859
Implantable Lead Model: 5076
Lead Channel Pacing Threshold Amplitude: 0.5 V
Lead Channel Pacing Threshold Amplitude: 1 V
Lead Channel Pacing Threshold Pulse Width: 0.4 ms
Lead Channel Sensing Intrinsic Amplitude: 1.4 mV
Lead Channel Sensing Intrinsic Amplitude: 5.6 mV
Lead Channel Setting Pacing Amplitude: 2 V
Lead Channel Setting Pacing Amplitude: 2.5 V
Lead Channel Setting Pacing Pulse Width: 0.4 ms
Lead Channel Setting Sensing Sensitivity: 4 mV
MDC IDC LEAD IMPLANT DT: 20100215
MDC IDC LEAD LOCATION: 753860
MDC IDC MSMT BATTERY VOLTAGE: 2.79 V
MDC IDC MSMT LEADCHNL RA IMPEDANCE VALUE: 442 Ohm
MDC IDC MSMT LEADCHNL RA PACING THRESHOLD PULSEWIDTH: 0.4 ms
MDC IDC MSMT LEADCHNL RV IMPEDANCE VALUE: 678 Ohm
MDC IDC STAT BRADY AP VS PERCENT: 75 %

## 2016-04-28 ENCOUNTER — Emergency Department (HOSPITAL_COMMUNITY): Payer: Medicare Other

## 2016-04-28 ENCOUNTER — Encounter (HOSPITAL_COMMUNITY): Payer: Self-pay | Admitting: Emergency Medicine

## 2016-04-28 ENCOUNTER — Emergency Department (HOSPITAL_COMMUNITY)
Admission: EM | Admit: 2016-04-28 | Discharge: 2016-04-28 | Disposition: A | Discharge status: Home / Self Care | Payer: Medicare Other | Attending: Emergency Medicine | Admitting: Emergency Medicine

## 2016-04-28 DIAGNOSIS — Z87891 Personal history of nicotine dependence: Secondary | ICD-10-CM | POA: Insufficient documentation

## 2016-04-28 DIAGNOSIS — Z7982 Long term (current) use of aspirin: Secondary | ICD-10-CM | POA: Diagnosis not present

## 2016-04-28 DIAGNOSIS — Z79899 Other long term (current) drug therapy: Secondary | ICD-10-CM | POA: Insufficient documentation

## 2016-04-28 DIAGNOSIS — I10 Essential (primary) hypertension: Secondary | ICD-10-CM | POA: Diagnosis not present

## 2016-04-28 DIAGNOSIS — R110 Nausea: Secondary | ICD-10-CM | POA: Diagnosis not present

## 2016-04-28 DIAGNOSIS — R42 Dizziness and giddiness: Secondary | ICD-10-CM | POA: Insufficient documentation

## 2016-04-28 LAB — COMPREHENSIVE METABOLIC PANEL
ALBUMIN: 3.5 g/dL (ref 3.5–5.0)
ALK PHOS: 63 U/L (ref 38–126)
ALT: 11 U/L — ABNORMAL LOW (ref 14–54)
AST: 13 U/L — AB (ref 15–41)
Anion gap: 5 (ref 5–15)
BUN: 16 mg/dL (ref 6–20)
CO2: 28 mmol/L (ref 22–32)
Calcium: 9.1 mg/dL (ref 8.9–10.3)
Chloride: 101 mmol/L (ref 101–111)
Creatinine, Ser: 0.91 mg/dL (ref 0.44–1.00)
GFR calc Af Amer: >60 mL/min (ref >60)
GFR calc non Af Amer: 53 mL/min — ABNORMAL LOW (ref >60)
GLUCOSE: 132 mg/dL — AB (ref 65–99)
POTASSIUM: 4 mmol/L (ref 3.5–5.1)
Sodium: 134 mmol/L — ABNORMAL LOW (ref 135–145)
Total Bilirubin: 0.7 mg/dL (ref 0.3–1.2)
Total Protein: 6.8 g/dL (ref 6.5–8.1)

## 2016-04-28 LAB — CBC WITH DIFFERENTIAL/PLATELET
BASOS ABS: 0.1 10*3/uL (ref 0.0–0.1)
BASOS PCT: 1 %
Eosinophils Absolute: 0.1 10*3/uL (ref 0.0–0.7)
Eosinophils Relative: 3 %
HEMATOCRIT: 34.6 % — AB (ref 36.0–46.0)
HEMOGLOBIN: 11.2 g/dL — AB (ref 12.0–15.0)
Lymphocytes Relative: 22 %
Lymphs Abs: 1.1 10*3/uL (ref 0.7–4.0)
MCH: 28.3 pg (ref 26.0–34.0)
MCHC: 32.4 g/dL (ref 30.0–36.0)
MCV: 87.4 fL (ref 78.0–100.0)
Monocytes Absolute: 0.4 10*3/uL (ref 0.1–1.0)
Monocytes Relative: 9 %
NEUTROS ABS: 3.2 10*3/uL (ref 1.7–7.7)
Neutrophils Relative %: 65 %
Platelets: 291 10*3/uL (ref 150–400)
RBC: 3.96 MIL/uL (ref 3.87–5.11)
RDW: 15.3 % (ref 11.5–15.5)
WBC: 4.8 10*3/uL (ref 4.0–10.5)

## 2016-04-28 LAB — URINALYSIS, ROUTINE W REFLEX MICROSCOPIC
Bilirubin Urine: NEGATIVE
GLUCOSE, UA: NEGATIVE mg/dL
Hgb urine dipstick: NEGATIVE
Leukocytes, UA: NEGATIVE
Nitrite: NEGATIVE
PH: 7.5 (ref 5.0–8.0)
Protein, ur: NEGATIVE mg/dL
Specific Gravity, Urine: 1.01 (ref 1.005–1.030)

## 2016-04-28 LAB — CBG MONITORING, ED: Glucose-Capillary: 128 mg/dL — ABNORMAL HIGH (ref 65–99)

## 2016-04-28 LAB — TROPONIN I: Troponin I: <0.03 ng/mL (ref <0.03)

## 2016-04-28 MED ORDER — MECLIZINE HCL 12.5 MG PO TABS
ORAL_TABLET | ORAL | Status: AC
  Administered 2016-04-28: 25 mg via ORAL
  Filled 2016-04-28: qty 2

## 2016-04-28 MED ORDER — ONDANSETRON HCL 4 MG/2ML IJ SOLN
INTRAMUSCULAR | Status: AC
  Administered 2016-04-28: 4 mg via INTRAVENOUS
  Filled 2016-04-28: qty 2

## 2016-04-28 MED ORDER — ONDANSETRON 4 MG PO TBDP: ORAL_TABLET | ORAL | 0 refills | Status: DC

## 2016-04-28 MED ORDER — ONDANSETRON HCL 4 MG/2ML IJ SOLN
4.0000 mg | Freq: Once | INTRAMUSCULAR | Status: AC
  Administered 2016-04-28: 4 mg via INTRAVENOUS

## 2016-04-28 MED ORDER — MECLIZINE HCL 25 MG PO TABS: 25.0000 mg | ORAL_TABLET | Freq: Three times a day (TID) | ORAL | 0 refills | Status: DC | PRN

## 2016-04-28 MED ORDER — MECLIZINE HCL 12.5 MG PO TABS
25.0000 mg | ORAL_TABLET | Freq: Once | ORAL | Status: AC
  Administered 2016-04-28: 25 mg via ORAL

## 2016-04-28 NOTE — ED Notes (Signed)
Pt taken to ct. Nad. No changes.

## 2016-04-28 NOTE — ED Notes (Signed)
edp aware of pt 

## 2016-04-28 NOTE — ED Notes (Signed)
Pt ambulated to bathroom, needed 2 assist. Limps due to knee pain. States dizziness did get a little worse and nausea much worse. Pt resting now with nausea. meds just given. Still dizzy.

## 2016-04-28 NOTE — ED Notes (Signed)
Daughter at bedsdie and update.

## 2016-04-28 NOTE — ED Triage Notes (Signed)
Pt son passed away last night. Woke up fine this am. States got out of bed at 7am and thought she was going to fall due to diziness. Tingling to L foot. A/o. Only c/o now is nausea.

## 2016-04-28 NOTE — Discharge Instructions (Signed)
Follow-up with her family doctor next week. Return to the ER this weekend if any problems.

## 2016-04-28 NOTE — ED Notes (Signed)
Denies dizziness a tthis time. lpt talking with family

## 2016-04-28 NOTE — ED Provider Notes (Signed)
[facility]EM[facility]Y DEPT Provider Note   CSN: [id number] Arrival date & time: 04/28/16  4098  By signing my name below, I, Placido Sou, attest that this documentation has been prepared under the direction and in the presence of Bethann Berkshire, MD. Electronically Signed: Placido Sou, ED Scribe. 04/28/16. 8:53 AM.   History   Chief Complaint Chief Complaint  Patient presents with  . Dizziness    HPI HPI Comments: Kelsey Thompson is a 82 y.o. female who presents to the Emergency Department complaining of an episode of dizziness onset this morning. She states she woke up "feeling nauseated and my head was swimming" and felt as if she was going to fall. Her daughter states she also experienced left foot paraesthesias which have since alleviated. Her daughter states that she just found out her son passed away last night and is concerned she is stressed and has not had enough rest. Per daughter, she notes that she checked her BP at the time of her symptoms and "the bottom number was ~115". Pt denies current dizziness, any regions of pain or other associated symptoms at this time.   The history is provided by the patient and a relative. No language interpreter was used.  Dizziness  Quality:  Imbalance and lightheadedness Severity:  Mild Onset quality:  Sudden Timing:  Intermittent Progression:  Resolved Chronicity:  New Relieved by:  Lying down Worsened by:  Movement Ineffective treatments:  None tried Associated symptoms: nausea   Associated symptoms: no chest pain, no diarrhea and no headaches    Past Medical History:  Diagnosis Date  . Arthritis   . Bradycardia   . GERD (gastroesophageal reflux disease)   . Hiatal hernia   . Hypercholesterolemia   . Hypertension   . Pacemaker     Patient Active Problem List   Diagnosis Date Noted  . Gonalgia 07/17/2014  . Sinoatrial node dysfunction (HCC) 09/26/2011  . CARDIAC PACEMAKER IN SITU 11/19/2008  . Essential hypertension  11/07/2008  . BRADYCARDIA 11/07/2008  . GERD 11/07/2008  . HIATAL HERNIA 11/07/2008    Past Surgical History:  Procedure Laterality Date  . APPENDECTOMY    . CESAREAN SECTION    . PACEMAKER INSERTION    . TONSILLECTOMY      OB History    No data available       Home Medications    Prior to Admission medications   Medication Sig Start Date End Date Taking? Authorizing Provider  amLODipine (NORVASC) 5 MG tablet TAKE ONE TABLET BY MOUTH ONCE DAILY 10/20/15   Marinus Maw, MD  aspirin EC 325 MG tablet Take 325 mg by mouth daily.     Historical Provider, MD  calcium-vitamin D (OSCAL WITH D) 500-200 MG-UNIT per tablet Take 1 tablet by mouth daily.     Historical Provider, MD  enalapril (VASOTEC) 5 MG tablet Take 5 mg by mouth daily. 12/01/14   Historical Provider, MD  fish oil-omega-3 fatty acids 1000 MG capsule Take 1 g by mouth daily.     Historical Provider, MD  omeprazole (PRILOSEC) 20 MG capsule Take 20 mg by mouth daily.     Historical Provider, MD    Family History Family History  Problem Relation Age of Onset  . Heart failure Mother   . Diabetes Father   . Heart attack Father     Social History Social History  Substance Use Topics  . Smoking status: Former Smoker    Types: Cigarettes    Quit date: 06/30/1981  .  Smokeless tobacco: Never Used  . Alcohol use No     Allergies   Horse-derived products and Penicillins   Review of Systems Review of Systems  Constitutional: Negative for appetite change and fatigue.  HENT: Negative for congestion, ear discharge and sinus pressure.   Eyes: Negative for discharge.  Respiratory: Negative for cough.   Cardiovascular: Negative for chest pain.  Gastrointestinal: Positive for nausea. Negative for abdominal pain and diarrhea.  Genitourinary: Negative for frequency and hematuria.  Musculoskeletal: Negative for arthralgias, back pain and myalgias.  Skin: Negative for rash.  Neurological: Positive for dizziness and  light-headedness. Negative for seizures, syncope and headaches.  Psychiatric/Behavioral: Positive for sleep disturbance. Negative for hallucinations.   Physical Exam Updated Vital Signs BP 160/87 (BP Location: Right Arm)   Pulse 68   Temp 98.1 F (36.7 C) (Oral)   Resp 15   Ht 5\' 2"  (1.575 m)   Wt 125 lb (56.7 kg)   SpO2 96%   BMI 22.86 kg/m   Physical Exam  Constitutional: She is oriented to person, place, and time. She appears well-developed.  HENT:  Head: Normocephalic.  Eyes: Conjunctivae and EOM are normal. No scleral icterus.  Neck: Neck supple. No thyromegaly present.  Cardiovascular: Normal rate and regular rhythm.  Exam reveals no gallop and no friction rub.   No murmur heard. Pulmonary/Chest: No stridor. She has no wheezes. She has no rales. She exhibits no tenderness.  Abdominal: She exhibits no distension. There is no tenderness. There is no rebound.  Musculoskeletal: Normal range of motion. She exhibits no edema.  Lymphadenopathy:    She has no cervical adenopathy.  Neurological: She is oriented to person, place, and time. She exhibits normal muscle tone. Coordination normal.  Skin: No rash noted. No erythema.  Psychiatric: She has a normal mood and affect. Her behavior is normal.   ED Treatments / Results  Labs (all labs ordered are listed, but only abnormal results are displayed) Labs Reviewed  CBC WITH DIFFERENTIAL/PLATELET - Abnormal; Notable for the following:       Result Value   Hemoglobin 11.2 (*)    HCT 34.6 (*)    All other components within normal limits  COMPREHENSIVE METABOLIC PANEL - Abnormal; Notable for the following:    Sodium 134 (*)    Glucose, Bld 132 (*)    AST 13 (*)    ALT 11 (*)    GFR calc non Af Amer 53 (*)    All other components within normal limits  URINALYSIS, ROUTINE W REFLEX MICROSCOPIC (NOT AT [facility]) - Abnormal; Notable for the following:    APPearance HAZY (*)    Ketones, ur TRACE (*)    All other components within  normal limits  CBG MONITORING, ED - Abnormal; Notable for the following:    Glucose-Capillary 128 (*)    All other components within normal limits  TROPONIN I    EKG  EKG Interpretation None       Radiology Ct Head Wo Contrast  Result Date: 04/28/2016 CLINICAL DATA:  Woke up fine this morning. Status post fall secondary to dizziness. EXAM: CT HEAD WITHOUT CONTRAST TECHNIQUE: Contiguous axial images were obtained from the base of the skull through the vertex without intravenous contrast. COMPARISON:  None. FINDINGS: Brain: No evidence of acute infarction, hemorrhage, extra-axial collection, ventriculomegaly, or mass effect. Generalized cerebral atrophy. Periventricular white matter low attenuation likely secondary to microangiopathy. Vascular: Cerebrovascular atherosclerotic calcifications are noted. Skull: Negative for fracture or focal lesion. Sinuses/Orbits: Visualized  portions of the orbits are unremarkable. Visualized portions of the paranasal sinuses and mastoid air cells are unremarkable. Other: None. IMPRESSION: 1. No acute intracranial pathology. 2. Chronic microvascular disease and cerebral atrophy. Electronically Signed   By: Elige KoHetal  Patel   On: 04/28/2016 09:47   Procedures Procedures  COORDINATION OF CARE: 8:51 AM Discussed next steps with pt. Pt verbalized understanding and is agreeable with the plan.    Medications Ordered in ED Medications - No data to display   Initial Impression / Assessment and Plan / ED Course  I have reviewed the triage vital signs and the nursing notes.  Pertinent labs & imaging results that were available during my care of the patient were reviewed by me and considered in my medical decision making (see chart for details).  Clinical Course   Labs and CT scan of the head unremarkable. Patient improved with Zofran and Antivert. Suspect inner ear problem. Patient wants to be discharged to go to her son's funeral. She will be prescribed Zofran and  Antivert and will follow-up with her PCP next week  The chart was scribed for me under my direct supervision.  I personally performed the history, physical, and medical decision making and all procedures in the evaluation of this patient..  Final Clinical Impressions(s) / ED Diagnoses   Final diagnoses:  None    New Prescriptions New Prescriptions   No medications on file     Bethann BerkshireJoseph Lura Falor, MD 04/28/16 1207

## 2016-06-28 ENCOUNTER — Ambulatory Visit (INDEPENDENT_AMBULATORY_CARE_PROVIDER_SITE_OTHER): Payer: Medicare Other | Admitting: *Deleted

## 2016-06-28 DIAGNOSIS — I495 Sick sinus syndrome: Secondary | ICD-10-CM | POA: Diagnosis not present

## 2016-06-29 NOTE — Progress Notes (Signed)
Remote pacemaker transmission.   

## 2016-06-30 ENCOUNTER — Encounter: Payer: Self-pay | Admitting: Cardiology

## 2016-06-30 LAB — CUP PACEART REMOTE DEVICE CHECK
Battery Voltage: 2.78 V
Brady Statistic AP VP Percent: 1 %
Brady Statistic AP VS Percent: 70 %
Brady Statistic AS VS Percent: 29 %
Date Time Interrogation Session: 20171227170438
Implantable Lead Implant Date: 20100215
Implantable Lead Location: 753859
Implantable Lead Location: 753860
Lead Channel Impedance Value: 430 Ohm
Lead Channel Pacing Threshold Pulse Width: 0.4 ms
Lead Channel Pacing Threshold Pulse Width: 0.4 ms
Lead Channel Setting Pacing Pulse Width: 0.4 ms
MDC IDC LEAD IMPLANT DT: 20100215
MDC IDC MSMT BATTERY IMPEDANCE: 486 Ohm
MDC IDC MSMT BATTERY REMAINING LONGEVITY: 91 mo
MDC IDC MSMT LEADCHNL RA PACING THRESHOLD AMPLITUDE: 0.5 V
MDC IDC MSMT LEADCHNL RV IMPEDANCE VALUE: 642 Ohm
MDC IDC MSMT LEADCHNL RV PACING THRESHOLD AMPLITUDE: 0.875 V
MDC IDC PG IMPLANT DT: 20100215
MDC IDC SET LEADCHNL RA PACING AMPLITUDE: 2 V
MDC IDC SET LEADCHNL RV PACING AMPLITUDE: 2.5 V
MDC IDC SET LEADCHNL RV SENSING SENSITIVITY: 2.8 mV
MDC IDC STAT BRADY AS VP PERCENT: 0 %

## 2016-08-25 ENCOUNTER — Encounter: Payer: Self-pay | Admitting: *Deleted

## 2016-09-04 ENCOUNTER — Telehealth: Payer: Self-pay | Admitting: Internal Medicine

## 2016-09-04 ENCOUNTER — Ambulatory Visit (INDEPENDENT_AMBULATORY_CARE_PROVIDER_SITE_OTHER): Payer: Medicare Other | Admitting: *Deleted

## 2016-09-04 ENCOUNTER — Encounter: Payer: Medicare Other | Admitting: Internal Medicine

## 2016-09-04 DIAGNOSIS — I495 Sick sinus syndrome: Secondary | ICD-10-CM | POA: Diagnosis not present

## 2016-09-04 NOTE — Telephone Encounter (Signed)
Spoke with Ms. Flynt, stated that pt was not feeling well and had to cancel apt with Dr. Ladona Ridgelaylor, and was rescheduled to May, she wanted to know if she could send in remote transmission, just to make sure the pacemaker was working normally, informed her she could send one in, and if anything was abnormal for the pt then she would receive a call back. Ms Zenaida DeedFlynt stated that she have the pt  send a remote transmission in today.

## 2016-09-04 NOTE — Telephone Encounter (Signed)
New message    Pt daughter is concerned with her mothers pacemaker, could you please check to see if its transmitting properly

## 2016-09-04 NOTE — Progress Notes (Signed)
Remote pacemaker transmission.   

## 2016-09-05 ENCOUNTER — Encounter: Payer: Self-pay | Admitting: Cardiology

## 2016-09-05 ENCOUNTER — Telehealth: Payer: Self-pay

## 2016-09-05 LAB — CUP PACEART REMOTE DEVICE CHECK
Battery Impedance: 534 Ohm
Battery Remaining Longevity: 87 mo
Battery Voltage: 2.79 V
Brady Statistic AP VS Percent: 71 %
Brady Statistic AS VP Percent: 0 %
Brady Statistic AS VS Percent: 28 %
Date Time Interrogation Session: 20180305171915
Implantable Lead Implant Date: 20100215
Implantable Lead Location: 753859
Implantable Lead Model: 5076
Implantable Lead Model: 5076
Lead Channel Impedance Value: 625 Ohm
Lead Channel Pacing Threshold Amplitude: 0.375 V
Lead Channel Pacing Threshold Pulse Width: 0.4 ms
Lead Channel Pacing Threshold Pulse Width: 0.4 ms
Lead Channel Setting Pacing Amplitude: 2 V
Lead Channel Setting Pacing Pulse Width: 0.4 ms
MDC IDC LEAD IMPLANT DT: 20100215
MDC IDC LEAD LOCATION: 753860
MDC IDC MSMT LEADCHNL RA IMPEDANCE VALUE: 430 Ohm
MDC IDC MSMT LEADCHNL RV PACING THRESHOLD AMPLITUDE: 1 V
MDC IDC PG IMPLANT DT: 20100215
MDC IDC SET LEADCHNL RV PACING AMPLITUDE: 2.5 V
MDC IDC SET LEADCHNL RV SENSING SENSITIVITY: 2.8 mV
MDC IDC STAT BRADY AP VP PERCENT: 1 %

## 2016-09-05 NOTE — Telephone Encounter (Signed)
Spoke with Ms. Flynt and informed her that pts pacemaker is functioning normally. [facility] appreciative of call.

## 2016-10-04 ENCOUNTER — Other Ambulatory Visit: Payer: Self-pay | Admitting: Internal Medicine

## 2016-10-31 ENCOUNTER — Encounter: Payer: Self-pay | Admitting: Internal Medicine

## 2016-10-31 ENCOUNTER — Ambulatory Visit (INDEPENDENT_AMBULATORY_CARE_PROVIDER_SITE_OTHER): Payer: Medicare Other | Admitting: Internal Medicine

## 2016-10-31 VITALS — BP 140/70 | HR 81 | Ht 59.0 in | Wt 126.4 lb

## 2016-10-31 DIAGNOSIS — I495 Sick sinus syndrome: Secondary | ICD-10-CM | POA: Diagnosis not present

## 2016-10-31 NOTE — Patient Instructions (Signed)
Medication Instructions:  Your physician recommends that you continue on your current medications as directed. Please refer to the Current Medication list given to you today.   Labwork: none  Testing/Procedures: none  Follow-Up: Remote monitoring is used to monitor your Pacemaker of ICD from home. This monitoring reduces the number of office visits required to check your device to one time per year. It allows [place] to keep an eye on the functioning of your device to ensure it is working properly. You are scheduled for a device check from home on 01/30/17. You may send your transmission at any time that day. If you have a wireless device, the transmission will be sent automatically. After your physician reviews your transmission, you will receive a postcard with your next transmission date.  Your physician wants you to follow-up in: 6 months.  You will receive a reminder letter in the mail two months in advance. If you don't receive a letter, please call our office to schedule the follow-up appointment.   Any Other Special Instructions Will Be Listed Below (If Applicable).     If you need a refill on your cardiac medications before your next appointment, please call your pharmacy.

## 2016-10-31 NOTE — Progress Notes (Signed)
HPI Kelsey Thompson returns today for followup. She is a very pleasant 83 year old woman with a history of symptomatic bradycardia and hypertension, status post permanent pacemaker insertion. She denies chest pain or shortness of breath. She denies syncope or peripheral edema. She has not been in the hospital since I saw her last. Her only medical complaint is pain in the left knee.  Allergies  Allergen Reactions  . Horse-Derived Products Other (See Comments)    Reaction unknown  . Penicillins Other (See Comments)    Reaction unknown     Current Outpatient Prescriptions  Medication Sig Dispense Refill  . amLODipine (NORVASC) 5 MG tablet TAKE ONE TABLET BY MOUTH ONCE DAILY 90 tablet 0  . aspirin EC 81 MG tablet Take 81 mg by mouth daily.     . calcium-vitamin D (OSCAL WITH D) 500-200 MG-UNIT per tablet Take 1 tablet by mouth daily.     . enalapril (VASOTEC) 5 MG tablet Take 5 mg by mouth daily.    . fish oil-omega-3 fatty acids 1000 MG capsule Take 1 g by mouth daily.     Marland Kitchen omeprazole (PRILOSEC) 20 MG capsule Take 20 mg by mouth daily.      No current facility-administered medications for this visit.      Past Medical History:  Diagnosis Date  . Arthritis   . Bradycardia   . GERD (gastroesophageal reflux disease)   . Hiatal hernia   . Hypercholesterolemia   . Hypertension   . Pacemaker     ROS:   All systems reviewed and negative except as noted in the HPI.   Past Surgical History:  Procedure Laterality Date  . APPENDECTOMY    . CESAREAN SECTION    . PACEMAKER INSERTION    . TONSILLECTOMY       Family History  Problem Relation Age of Onset  . Heart failure Mother   . Diabetes Father   . Heart attack Father      Social History   Social History  . Marital status: Widowed    Spouse name: N/A  . Number of children: N/A  . Years of education: N/A   Occupational History  . Not on file.   Social History Main Topics  . Smoking status: Former Smoker    Types:  Cigarettes    Quit date: 06/30/1981  . Smokeless tobacco: Never Used  . Alcohol use No  . Drug use: No  . Sexual activity: No   Other Topics Concern  . Not on file   Social History Narrative  . No narrative on file     BP 140/70 (BP Location: Right Arm)   Pulse 81   Ht  (1.499 m)   Wt 126 lb 6.4 oz (57.3 kg)   BMI 25.53 kg/m   Physical Exam:  Well appearing 83 yo man,NAD HEENT: Unremarkable Neck:  6 cm JVD, no thyromegally Lungs:  Clear with no wheezes, rales, or rhonchi. HEART:  Regular rate rhythm, no murmurs, no rubs, no clicks Abd:  soft, positive bowel sounds, no organomegally, no rebound, no guarding Ext:  2 plus pulses, no edema, no cyanosis, no clubbing Skin:  No rashes no nodules Neuro:  CN II through XII intact, motor grossly intact  ECG - nsr with atrial pacing  DEVICE  Normal device function.  See PaceArt for details.   Assess/Plan: 1. Symptomatic sinus node dysfunction - she is doing well and is asymptomatic after PPM insertion 2. HTN - her blood pressure is minimally  elevated. Will follow. 3. PPM - Her Medtronic DDD PM is working normally. Will recheck in several months.  Leonia Reeves.D.

## 2016-11-07 ENCOUNTER — Encounter: Payer: Medicare Other | Admitting: Internal Medicine

## 2016-11-07 LAB — CUP PACEART INCLINIC DEVICE CHECK
Battery Impedance: 534 Ohm
Battery Remaining Longevity: 87 mo
Battery Voltage: 2.78 V
Brady Statistic AP VP Percent: 1 %
Brady Statistic AP VS Percent: 72 %
Date Time Interrogation Session: 20180501133542
Implantable Lead Implant Date: 20100215
Implantable Lead Location: 753860
Implantable Lead Model: 5076
Implantable Pulse Generator Implant Date: 20100215
Lead Channel Impedance Value: 597 Ohm
Lead Channel Pacing Threshold Amplitude: 0.5 V
Lead Channel Pacing Threshold Amplitude: 1 V
Lead Channel Pacing Threshold Amplitude: 1 V
Lead Channel Pacing Threshold Pulse Width: 0.4 ms
Lead Channel Pacing Threshold Pulse Width: 0.4 ms
Lead Channel Sensing Intrinsic Amplitude: 5.6 mV
Lead Channel Setting Pacing Amplitude: 2 V
Lead Channel Setting Pacing Amplitude: 2.5 V
Lead Channel Setting Sensing Sensitivity: 2.8 mV
MDC IDC LEAD IMPLANT DT: 20100215
MDC IDC LEAD LOCATION: 753859
MDC IDC MSMT LEADCHNL RA IMPEDANCE VALUE: 413 Ohm
MDC IDC MSMT LEADCHNL RA PACING THRESHOLD AMPLITUDE: 0.75 V
MDC IDC MSMT LEADCHNL RA PACING THRESHOLD PULSEWIDTH: 0.4 ms
MDC IDC MSMT LEADCHNL RA SENSING INTR AMPL: 2 mV
MDC IDC MSMT LEADCHNL RV PACING THRESHOLD PULSEWIDTH: 0.4 ms
MDC IDC SET LEADCHNL RV PACING PULSEWIDTH: 0.4 ms
MDC IDC STAT BRADY AS VP PERCENT: 0 %
MDC IDC STAT BRADY AS VS PERCENT: 27 %

## 2016-12-21 ENCOUNTER — Other Ambulatory Visit (INDEPENDENT_AMBULATORY_CARE_PROVIDER_SITE_OTHER): Payer: Medicare Other

## 2017-01-15 ENCOUNTER — Other Ambulatory Visit: Payer: Self-pay | Admitting: Internal Medicine

## 2017-01-30 ENCOUNTER — Ambulatory Visit (INDEPENDENT_AMBULATORY_CARE_PROVIDER_SITE_OTHER): Payer: Medicare Other | Admitting: *Deleted

## 2017-01-30 DIAGNOSIS — I495 Sick sinus syndrome: Secondary | ICD-10-CM | POA: Diagnosis not present

## 2017-01-30 NOTE — Progress Notes (Signed)
Remote pacemaker transmission.   

## 2017-02-01 ENCOUNTER — Encounter: Payer: Self-pay | Admitting: Cardiology

## 2017-02-02 LAB — CUP PACEART REMOTE DEVICE CHECK
Battery Voltage: 2.78 V
Brady Statistic AP VS Percent: 79 %
Brady Statistic AS VP Percent: 0 %
Brady Statistic AS VS Percent: 20 %
Implantable Lead Implant Date: 20100215
Implantable Lead Implant Date: 20100215
Implantable Lead Location: 753859
Implantable Lead Model: 5076
Implantable Pulse Generator Implant Date: 20100215
Lead Channel Impedance Value: 424 Ohm
Lead Channel Impedance Value: 684 Ohm
Lead Channel Pacing Threshold Amplitude: 1 V
Lead Channel Pacing Threshold Pulse Width: 0.4 ms
Lead Channel Pacing Threshold Pulse Width: 0.4 ms
Lead Channel Sensing Intrinsic Amplitude: 5.6 mV
Lead Channel Setting Pacing Amplitude: 2 V
Lead Channel Setting Sensing Sensitivity: 2.8 mV
MDC IDC LEAD LOCATION: 753860
MDC IDC MSMT BATTERY IMPEDANCE: 608 Ohm
MDC IDC MSMT BATTERY REMAINING LONGEVITY: 81 mo
MDC IDC MSMT LEADCHNL RA PACING THRESHOLD AMPLITUDE: 0.5 V
MDC IDC MSMT LEADCHNL RA SENSING INTR AMPL: 1.4 mV
MDC IDC SESS DTM: 20180731121849
MDC IDC SET LEADCHNL RV PACING AMPLITUDE: 2.5 V
MDC IDC SET LEADCHNL RV PACING PULSEWIDTH: 0.4 ms
MDC IDC STAT BRADY AP VP PERCENT: 0 %

## 2017-03-13 ENCOUNTER — Telehealth: Payer: Self-pay | Admitting: Internal Medicine

## 2017-03-13 NOTE — Telephone Encounter (Signed)
Call returned to Pt Kelsey Thompson.  Notified per Dr. Ladona Ridgelaylor ok to wait for 12 months for check up if Pt doing well.  Recall requested and entered.  No further action needed.

## 2017-03-13 NOTE — Telephone Encounter (Signed)
New Message  Pt daughter call requesting to speak with RN about why she would need appt for November, when she usually f/u in March. Please call back to discuss

## 2017-04-26 ENCOUNTER — Other Ambulatory Visit (INDEPENDENT_AMBULATORY_CARE_PROVIDER_SITE_OTHER): Payer: Medicare Other

## 2017-04-26 ENCOUNTER — Other Ambulatory Visit: Payer: Self-pay | Admitting: Orthopedic Surgery

## 2017-04-26 DIAGNOSIS — M25562 Pain in left knee: Secondary | ICD-10-CM

## 2017-04-26 DIAGNOSIS — R52 Pain, unspecified: Secondary | ICD-10-CM

## 2017-05-01 ENCOUNTER — Ambulatory Visit (INDEPENDENT_AMBULATORY_CARE_PROVIDER_SITE_OTHER): Payer: Medicare Other | Admitting: *Deleted

## 2017-05-01 DIAGNOSIS — I495 Sick sinus syndrome: Secondary | ICD-10-CM | POA: Diagnosis not present

## 2017-05-01 NOTE — Progress Notes (Signed)
Remote pacemaker transmission.   

## 2017-05-02 LAB — CUP PACEART REMOTE DEVICE CHECK
Battery Remaining Longevity: 78 mo
Battery Voltage: 2.78 V
Brady Statistic AP VP Percent: 1 %
Brady Statistic AP VS Percent: 78 %
Brady Statistic AS VP Percent: 0 %
Brady Statistic AS VS Percent: 21 %
Date Time Interrogation Session: 20181030160646
Implantable Lead Implant Date: 20100215
Implantable Lead Location: 753859
Implantable Lead Location: 753860
Implantable Lead Model: 5076
Implantable Pulse Generator Implant Date: 20100215
Lead Channel Impedance Value: 617 Ohm
Lead Channel Pacing Threshold Amplitude: 0.5 V
Lead Channel Pacing Threshold Amplitude: 1 V
Lead Channel Pacing Threshold Pulse Width: 0.4 ms
Lead Channel Pacing Threshold Pulse Width: 0.4 ms
Lead Channel Setting Pacing Amplitude: 2 V
Lead Channel Setting Pacing Amplitude: 2.5 V
Lead Channel Setting Pacing Pulse Width: 0.4 ms
MDC IDC LEAD IMPLANT DT: 20100215
MDC IDC MSMT BATTERY IMPEDANCE: 657 Ohm
MDC IDC MSMT LEADCHNL RA IMPEDANCE VALUE: 403 Ohm
MDC IDC MSMT LEADCHNL RA SENSING INTR AMPL: 1.4 mV
MDC IDC MSMT LEADCHNL RV SENSING INTR AMPL: 5.6 mV
MDC IDC SET LEADCHNL RV SENSING SENSITIVITY: 2.8 mV

## 2017-05-09 ENCOUNTER — Encounter: Payer: Self-pay | Admitting: Cardiology

## 2017-07-31 ENCOUNTER — Ambulatory Visit (INDEPENDENT_AMBULATORY_CARE_PROVIDER_SITE_OTHER): Payer: Medicare Other | Admitting: *Deleted

## 2017-07-31 DIAGNOSIS — I495 Sick sinus syndrome: Secondary | ICD-10-CM | POA: Diagnosis not present

## 2017-07-31 NOTE — Progress Notes (Signed)
Remote pacemaker transmission.   

## 2017-08-02 ENCOUNTER — Encounter: Payer: Self-pay | Admitting: Cardiology

## 2017-08-02 NOTE — Progress Notes (Signed)
Letter  

## 2017-08-06 LAB — CUP PACEART REMOTE DEVICE CHECK
Battery Impedance: 732 Ohm
Battery Voltage: 2.78 V
Brady Statistic AP VP Percent: 1 %
Brady Statistic AP VS Percent: 75 %
Brady Statistic AS VP Percent: 0 %
Brady Statistic AS VS Percent: 25 %
Implantable Lead Implant Date: 20100215
Implantable Lead Implant Date: 20100215
Implantable Lead Location: 753859
Implantable Lead Model: 5076
Implantable Lead Model: 5076
Lead Channel Impedance Value: 431 Ohm
Lead Channel Impedance Value: 640 Ohm
Lead Channel Pacing Threshold Amplitude: 0.5 V
Lead Channel Pacing Threshold Pulse Width: 0.4 ms
Lead Channel Pacing Threshold Pulse Width: 0.4 ms
Lead Channel Sensing Intrinsic Amplitude: 1 mV
Lead Channel Sensing Intrinsic Amplitude: 5.6 mV
Lead Channel Setting Pacing Amplitude: 2 V
Lead Channel Setting Sensing Sensitivity: 2.8 mV
MDC IDC LEAD LOCATION: 753860
MDC IDC MSMT BATTERY REMAINING LONGEVITY: 74 mo
MDC IDC MSMT LEADCHNL RV PACING THRESHOLD AMPLITUDE: 1 V
MDC IDC PG IMPLANT DT: 20100215
MDC IDC SESS DTM: 20190129150125
MDC IDC SET LEADCHNL RV PACING AMPLITUDE: 2.5 V
MDC IDC SET LEADCHNL RV PACING PULSEWIDTH: 0.4 ms

## 2017-09-28 ENCOUNTER — Ambulatory Visit: Payer: Medicare Other | Admitting: Internal Medicine

## 2017-09-28 ENCOUNTER — Encounter: Payer: Self-pay | Admitting: Internal Medicine

## 2017-09-28 VITALS — BP 130/70 | HR 87 | Ht <= 58 in | Wt 123.0 lb

## 2017-09-28 DIAGNOSIS — Z950 Presence of cardiac pacemaker: Secondary | ICD-10-CM | POA: Diagnosis not present

## 2017-09-28 DIAGNOSIS — I495 Sick sinus syndrome: Secondary | ICD-10-CM

## 2017-09-28 DIAGNOSIS — I10 Essential (primary) hypertension: Secondary | ICD-10-CM

## 2017-09-28 LAB — CUP PACEART INCLINIC DEVICE CHECK
Battery Impedance: 731 Ohm
Battery Remaining Longevity: 75 mo
Battery Voltage: 2.78 V
Brady Statistic AP VP Percent: 1 %
Brady Statistic AS VP Percent: 0 %
Brady Statistic AS VS Percent: 23 %
Date Time Interrogation Session: 20190329132554
Implantable Lead Implant Date: 20100215
Implantable Lead Implant Date: 20100215
Implantable Lead Location: 753859
Implantable Lead Location: 753860
Implantable Lead Model: 5076
Implantable Pulse Generator Implant Date: 20100215
Lead Channel Impedance Value: 648 Ohm
Lead Channel Pacing Threshold Amplitude: 0.5 V
Lead Channel Pacing Threshold Amplitude: 0.875 V
Lead Channel Pacing Threshold Pulse Width: 0.4 ms
Lead Channel Pacing Threshold Pulse Width: 0.4 ms
Lead Channel Sensing Intrinsic Amplitude: 11.2 mV
Lead Channel Setting Pacing Amplitude: 2 V
Lead Channel Setting Pacing Amplitude: 2.5 V
Lead Channel Setting Sensing Sensitivity: 2.8 mV
MDC IDC MSMT LEADCHNL RA IMPEDANCE VALUE: 419 Ohm
MDC IDC MSMT LEADCHNL RA PACING THRESHOLD AMPLITUDE: 0.5 V
MDC IDC MSMT LEADCHNL RV PACING THRESHOLD AMPLITUDE: 1 V
MDC IDC MSMT LEADCHNL RV PACING THRESHOLD PULSEWIDTH: 0.4 ms
MDC IDC MSMT LEADCHNL RV PACING THRESHOLD PULSEWIDTH: 0.4 ms
MDC IDC SET LEADCHNL RV PACING PULSEWIDTH: 0.4 ms
MDC IDC STAT BRADY AP VS PERCENT: 77 %

## 2017-09-28 MED ORDER — ENALAPRIL MALEATE 5 MG PO TABS: 5.0000 mg | ORAL_TABLET | Freq: Every day | ORAL | 3 refills | Status: DC

## 2017-09-28 MED ORDER — AMLODIPINE BESYLATE 5 MG PO TABS: 5.0000 mg | ORAL_TABLET | Freq: Every day | ORAL | 3 refills | Status: DC

## 2017-09-28 NOTE — Patient Instructions (Signed)
Medication Instructions:  Your physician recommends that you continue on your current medications as directed. Please refer to the Current Medication list given to you today.  Labwork: None ordered.  Testing/Procedures: None ordered.  Follow-Up: Your physician wants you to follow-up in: one year with Dr. Ladona Ridgelaylor.   You will receive a reminder letter in the mail two months in advance. If you don't receive a letter, please call our office to schedule the follow-up appointment.  Remote monitoring is used to monitor your Pacemaker from home. This monitoring reduces the number of office visits required to check your device to one time per year. It allows us to keep an eye on the functioning of your device to ensure it is working properly. You are scheduled for a device check from home on 10/30/2017. You may send your transmission at any time that day. If you have a wireless device, the transmission will be sent automatically. After your physician reviews your transmission, you will receive a postcard with your next transmission date.  Any Other Special Instructions Will Be Listed Below (If Applicable).  If you need a refill on your cardiac medications before your next appointment, please call your pharmacy.

## 2017-09-28 NOTE — Progress Notes (Signed)
HPI Kelsey Thompson returns today for followup. She is a very pleasant 84 year old woman with a history of symptomatic bradycardia and hypertension, status post permanent pacemaker insertion. She denies chest pain or shortness of breath. She denies syncope or peripheral edema. She has not been in the hospital since I saw her last.   Allergies  Allergen Reactions  . Horse-Derived Products Other (See Comments)    Reaction unknown  . Penicillins Other (See Comments)    Reaction unknown     Current Outpatient Medications  Medication Sig Dispense Refill  . amLODipine (NORVASC) 5 MG tablet Take 1 tablet (5 mg total) by mouth daily. 90 tablet 3  . aspirin EC 81 MG tablet Take 81 mg by mouth daily.     . calcium-vitamin D (OSCAL WITH D) 500-200 MG-UNIT per tablet Take 1 tablet by mouth daily.     . enalapril (VASOTEC) 5 MG tablet Take 1 tablet (5 mg total) by mouth daily. 90 tablet 3  . fish oil-omega-3 fatty acids 1000 MG capsule Take 1 g by mouth daily.     Marland Kitchen omeprazole (PRILOSEC) 20 MG capsule Take 20 mg by mouth daily.      No current facility-administered medications for this visit.      Past Medical History:  Diagnosis Date  . Arthritis   . Bradycardia   . GERD (gastroesophageal reflux disease)   . Hiatal hernia   . Hypercholesterolemia   . Hypertension   . Pacemaker     ROS:   All systems reviewed and negative except as noted in the HPI.   Past Surgical History:  Procedure Laterality Date  . APPENDECTOMY    . CESAREAN SECTION    . PACEMAKER INSERTION    . TONSILLECTOMY       Family History  Problem Relation Age of Onset  . Heart failure Mother   . Diabetes Father   . Heart attack Father      Social History   Socioeconomic History  . Marital status: Widowed    Spouse name: Not on file  . Number of children: Not on file  . Years of education: Not on file  . Highest education level: Not on file  Occupational History  . Not on file  Social Needs    . Financial resource strain: Not on file  . Food insecurity:    Worry: Not on file    Inability: Not on file  . Transportation needs:    Medical: Not on file    Non-medical: Not on file  Tobacco Use  . Smoking status: Former Smoker    Types: Cigarettes    Last attempt to quit: 06/30/1981    Years since quitting: 36.2  . Smokeless tobacco: Never Used  Substance and Sexual Activity  . Alcohol use: No    Alcohol/week: 0.0 oz  . Drug use: No  . Sexual activity: Never  Lifestyle  . Physical activity:    Days per week: Not on file    Minutes per session: Not on file  . Stress: Not on file  Relationships  . Social connections:    Talks on phone: Not on file    Gets together: Not on file    Attends religious service: Not on file    Active member of club or organization: Not on file    Attends meetings of clubs or organizations: Not on file    Relationship status: Not on file  . Intimate partner violence:  Fear of current or ex partner: Not on file    Emotionally abused: Not on file    Physically abused: Not on file    Forced sexual activity: Not on file  Other Topics Concern  . Not on file  Social History Narrative  . Not on file     BP 130/70   Pulse 87   Ht 4\' 10"  (1.473 m)   Wt 123 lb (55.8 kg)   SpO2 98%   BMI 25.71 kg/m   Physical Exam:  Well appearing NAD HEENT: Unremarkable Neck:  No JVD, no thyromegally Lymphatics:  No adenopathy Back:  No CVA tenderness Lungs:  Clear with no wheezes HEART:  Regular rate rhythm, no murmurs, no rubs, no clicks Abd:  soft, positive bowel sounds, no organomegally, no rebound, no guarding Ext:  2 plus pulses, no edema, no cyanosis, no clubbing Skin:  No rashes no nodules Neuro:  CN II through XII intact, motor grossly intact   DEVICE  Normal device function.  See PaceArt for details.   Assess/Plan: 1. Symptomatic sinus node dysfunction - she remains stable and is asymptomatic. 2. PPM - Her medtronic DDD PM is  working normally. Will recheck in several months. 3. HTN - her blood pressure is well controlled. She will continue her current meds.  Lewayne BuntingGregg Aja Bolander MD

## 2017-10-30 ENCOUNTER — Ambulatory Visit (INDEPENDENT_AMBULATORY_CARE_PROVIDER_SITE_OTHER): Payer: Medicare Other | Admitting: *Deleted

## 2017-10-30 DIAGNOSIS — I495 Sick sinus syndrome: Secondary | ICD-10-CM

## 2017-10-30 NOTE — Progress Notes (Signed)
Remote pacemaker transmission.   

## 2017-10-31 ENCOUNTER — Encounter: Payer: Self-pay | Admitting: Cardiology

## 2017-10-31 LAB — CUP PACEART REMOTE DEVICE CHECK
Battery Remaining Longevity: 70 mo
Battery Voltage: 2.77 V
Brady Statistic AP VS Percent: 86 %
Brady Statistic AS VS Percent: 13 %
Date Time Interrogation Session: 20190430134724
Implantable Lead Implant Date: 20100215
Implantable Lead Location: 753859
Implantable Lead Location: 753860
Lead Channel Pacing Threshold Amplitude: 1 V
Lead Channel Pacing Threshold Pulse Width: 0.4 ms
Lead Channel Setting Pacing Amplitude: 2 V
Lead Channel Setting Pacing Pulse Width: 0.4 ms
MDC IDC LEAD IMPLANT DT: 20100215
MDC IDC MSMT BATTERY IMPEDANCE: 807 Ohm
MDC IDC MSMT LEADCHNL RA IMPEDANCE VALUE: 442 Ohm
MDC IDC MSMT LEADCHNL RA PACING THRESHOLD AMPLITUDE: 0.5 V
MDC IDC MSMT LEADCHNL RA PACING THRESHOLD PULSEWIDTH: 0.4 ms
MDC IDC MSMT LEADCHNL RV IMPEDANCE VALUE: 711 Ohm
MDC IDC PG IMPLANT DT: 20100215
MDC IDC SET LEADCHNL RV PACING AMPLITUDE: 2.5 V
MDC IDC SET LEADCHNL RV SENSING SENSITIVITY: 4 mV
MDC IDC STAT BRADY AP VP PERCENT: 0 %
MDC IDC STAT BRADY AS VP PERCENT: 0 %

## 2018-01-29 ENCOUNTER — Ambulatory Visit (INDEPENDENT_AMBULATORY_CARE_PROVIDER_SITE_OTHER): Payer: Medicare Other | Admitting: *Deleted

## 2018-01-29 DIAGNOSIS — I495 Sick sinus syndrome: Secondary | ICD-10-CM | POA: Diagnosis not present

## 2018-01-29 NOTE — Progress Notes (Signed)
Remote pacemaker transmission.   

## 2018-01-30 ENCOUNTER — Encounter: Payer: Self-pay | Admitting: Cardiology

## 2018-02-02 LAB — CUP PACEART REMOTE DEVICE CHECK
Brady Statistic AP VP Percent: 0 %
Brady Statistic AS VP Percent: 0 %
Brady Statistic AS VS Percent: 13 %
Date Time Interrogation Session: 20190730130705
Implantable Lead Implant Date: 20100215
Implantable Lead Location: 753859
Implantable Lead Model: 5076
Lead Channel Impedance Value: 424 Ohm
Lead Channel Pacing Threshold Amplitude: 0.5 V
Lead Channel Pacing Threshold Amplitude: 1 V
Lead Channel Sensing Intrinsic Amplitude: 5.6 mV
Lead Channel Setting Pacing Amplitude: 2 V
MDC IDC LEAD IMPLANT DT: 20100215
MDC IDC LEAD LOCATION: 753860
MDC IDC MSMT BATTERY IMPEDANCE: 857 Ohm
MDC IDC MSMT BATTERY REMAINING LONGEVITY: 67 mo
MDC IDC MSMT BATTERY VOLTAGE: 2.78 V
MDC IDC MSMT LEADCHNL RA PACING THRESHOLD PULSEWIDTH: 0.4 ms
MDC IDC MSMT LEADCHNL RV IMPEDANCE VALUE: 727 Ohm
MDC IDC MSMT LEADCHNL RV PACING THRESHOLD PULSEWIDTH: 0.4 ms
MDC IDC PG IMPLANT DT: 20100215
MDC IDC SET LEADCHNL RV PACING AMPLITUDE: 2.5 V
MDC IDC SET LEADCHNL RV PACING PULSEWIDTH: 0.4 ms
MDC IDC SET LEADCHNL RV SENSING SENSITIVITY: 2.8 mV
MDC IDC STAT BRADY AP VS PERCENT: 86 %

## 2018-04-30 ENCOUNTER — Ambulatory Visit (INDEPENDENT_AMBULATORY_CARE_PROVIDER_SITE_OTHER): Payer: Medicare Other | Admitting: *Deleted

## 2018-04-30 DIAGNOSIS — I495 Sick sinus syndrome: Secondary | ICD-10-CM

## 2018-04-30 NOTE — Progress Notes (Signed)
Remote pacemaker transmission.   

## 2018-06-30 LAB — CUP PACEART REMOTE DEVICE CHECK
Battery Remaining Longevity: 65 mo
Battery Voltage: 2.77 V
Brady Statistic AP VS Percent: 85 %
Brady Statistic AS VP Percent: 0 %
Date Time Interrogation Session: 20191029131429
Implantable Lead Implant Date: 20100215
Implantable Lead Location: 753859
Implantable Lead Location: 753860
Implantable Lead Model: 5076
Implantable Lead Model: 5076
Implantable Pulse Generator Implant Date: 20100215
Lead Channel Pacing Threshold Pulse Width: 0.4 ms
Lead Channel Pacing Threshold Pulse Width: 0.4 ms
Lead Channel Setting Pacing Amplitude: 2 V
Lead Channel Setting Pacing Amplitude: 2.5 V
Lead Channel Setting Pacing Pulse Width: 0.4 ms
Lead Channel Setting Sensing Sensitivity: 4 mV
MDC IDC LEAD IMPLANT DT: 20100215
MDC IDC MSMT BATTERY IMPEDANCE: 907 Ohm
MDC IDC MSMT LEADCHNL RA IMPEDANCE VALUE: 436 Ohm
MDC IDC MSMT LEADCHNL RA PACING THRESHOLD AMPLITUDE: 0.5 V
MDC IDC MSMT LEADCHNL RV IMPEDANCE VALUE: 673 Ohm
MDC IDC MSMT LEADCHNL RV PACING THRESHOLD AMPLITUDE: 0.75 V
MDC IDC STAT BRADY AP VP PERCENT: 0 %
MDC IDC STAT BRADY AS VS PERCENT: 14 %

## 2018-07-30 ENCOUNTER — Ambulatory Visit (INDEPENDENT_AMBULATORY_CARE_PROVIDER_SITE_OTHER): Payer: Medicare Other

## 2018-07-30 DIAGNOSIS — I495 Sick sinus syndrome: Secondary | ICD-10-CM | POA: Diagnosis not present

## 2018-07-31 NOTE — Progress Notes (Signed)
Remote pacemaker transmission.   

## 2018-08-01 LAB — CUP PACEART REMOTE DEVICE CHECK
Battery Impedance: 983 Ohm
Battery Remaining Longevity: 63 mo
Brady Statistic AP VP Percent: 0 %
Brady Statistic AP VS Percent: 83 %
Brady Statistic AS VP Percent: 0 %
Brady Statistic AS VS Percent: 17 %
Implantable Lead Implant Date: 20100215
Implantable Lead Implant Date: 20100215
Implantable Lead Model: 5076
Implantable Lead Model: 5076
Lead Channel Impedance Value: 454 Ohm
Lead Channel Impedance Value: 706 Ohm
Lead Channel Pacing Threshold Amplitude: 0.5 V
Lead Channel Pacing Threshold Amplitude: 0.875 V
Lead Channel Pacing Threshold Pulse Width: 0.4 ms
Lead Channel Setting Pacing Amplitude: 2 V
Lead Channel Setting Pacing Amplitude: 2.5 V
Lead Channel Setting Sensing Sensitivity: 2.8 mV
MDC IDC LEAD LOCATION: 753859
MDC IDC LEAD LOCATION: 753860
MDC IDC MSMT BATTERY VOLTAGE: 2.78 V
MDC IDC MSMT LEADCHNL RV PACING THRESHOLD PULSEWIDTH: 0.4 ms
MDC IDC PG IMPLANT DT: 20100215
MDC IDC SESS DTM: 20200128140615
MDC IDC SET LEADCHNL RV PACING PULSEWIDTH: 0.4 ms

## 2018-09-04 ENCOUNTER — Encounter: Payer: Self-pay | Admitting: Internal Medicine

## 2018-09-19 ENCOUNTER — Encounter: Payer: Medicare Other | Admitting: Internal Medicine

## 2018-09-30 ENCOUNTER — Other Ambulatory Visit: Payer: Self-pay | Admitting: Internal Medicine

## 2018-09-30 MED ORDER — AMLODIPINE BESYLATE 5 MG PO TABS: 5.0000 mg | ORAL_TABLET | Freq: Every day | ORAL | 1 refills | Status: DC

## 2018-09-30 NOTE — Telephone Encounter (Signed)
Pt's medication was sent to pt's pharmacy as requested. Confirmation received.  °

## 2018-10-29 ENCOUNTER — Ambulatory Visit (INDEPENDENT_AMBULATORY_CARE_PROVIDER_SITE_OTHER): Payer: Medicare Other | Admitting: *Deleted

## 2018-10-29 ENCOUNTER — Other Ambulatory Visit: Payer: Self-pay

## 2018-10-29 DIAGNOSIS — I495 Sick sinus syndrome: Secondary | ICD-10-CM | POA: Diagnosis not present

## 2018-10-29 LAB — CUP PACEART REMOTE DEVICE CHECK
Battery Impedance: 1035 Ohm
Battery Remaining Longevity: 61 mo
Battery Voltage: 2.77 V
Brady Statistic AP VP Percent: 0 %
Brady Statistic AP VS Percent: 82 %
Brady Statistic AS VP Percent: 0 %
Brady Statistic AS VS Percent: 17 %
Date Time Interrogation Session: 20200428110840
Implantable Lead Implant Date: 20100215
Implantable Lead Implant Date: 20100215
Implantable Lead Location: 753859
Implantable Lead Location: 753860
Implantable Lead Model: 5076
Implantable Lead Model: 5076
Implantable Pulse Generator Implant Date: 20100215
Lead Channel Impedance Value: 430 Ohm
Lead Channel Impedance Value: 667 Ohm
Lead Channel Pacing Threshold Amplitude: 0.5 V
Lead Channel Pacing Threshold Amplitude: 1 V
Lead Channel Pacing Threshold Pulse Width: 0.4 ms
Lead Channel Pacing Threshold Pulse Width: 0.4 ms
Lead Channel Setting Pacing Amplitude: 2 V
Lead Channel Setting Pacing Amplitude: 2.5 V
Lead Channel Setting Pacing Pulse Width: 0.4 ms
Lead Channel Setting Sensing Sensitivity: 2.8 mV

## 2018-11-01 ENCOUNTER — Telehealth: Payer: Self-pay | Admitting: Internal Medicine

## 2018-11-01 NOTE — Telephone Encounter (Signed)
New message   Spoke with pt to schedule a doxemity call with Dr. Ladona Ridgel. Pt has a landline phone number is listed in appt notes.      Virtual Visit Pre-Appointment Phone Call  "(Name), I am calling you today to discuss your upcoming appointment. We are currently trying to limit exposure to the virus that causes COVID-19 by seeing patients at home rather than in the office."  1. "What is the BEST phone number to call the day of the visit?" - include this in appointment notes  2. Do you have or have access to (through a family member/friend) a smartphone with video capability that we can use for your visit?" a. If yes - list this number in appt notes as cell (if different from BEST phone #) and list the appointment type as a VIDEO visit in appointment notes b. If no - list the appointment type as a PHONE visit in appointment notes  3. Confirm consent - "In the setting of the current Covid19 crisis, you are scheduled for a (phone or video) visit with your provider on (date) at (time).  Just as we do with many in-office visits, in order for you to participate in this visit, we must obtain consent.  If you'd like, I can send this to your mychart (if signed up) or email for you to review.  Otherwise, I can obtain your verbal consent now.  All virtual visits are billed to your insurance company just like a normal visit would be.  By agreeing to a virtual visit, we'd like you to understand that the technology does not allow for your provider to perform an examination, and thus may limit your provider's ability to fully assess your condition. If your provider identifies any concerns that need to be evaluated in person, we will make arrangements to do so.  Finally, though the technology is pretty good, we cannot assure that it will always work on either your or our end, and in the setting of a video visit, we may have to convert it to a phone-only visit.  In either situation, we cannot ensure that we have  a secure connection.  Are you willing to proceed?" STAFF: Did the patient verbally acknowledge consent to telehealth visit? Document YES/NO here: YES  4. Advise patient to be prepared - "Two hours prior to your appointment, go ahead and check your blood pressure, pulse, oxygen saturation, and your weight (if you have the equipment to check those) and write them all down. When your visit starts, your provider will ask you for this information. If you have an Apple Watch or Kardia device, please plan to have heart rate information ready on the day of your appointment. Please have a pen and paper handy nearby the day of the visit as well."  5. Give patient instructions for MyChart download to smartphone OR Doximity/Doxy.me as below if video visit (depending on what platform provider is using)  6. Inform patient they will receive a phone call 15 minutes prior to their appointment time (may be from unknown caller ID) so they should be prepared to answer    TELEPHONE CALL NOTE  Kelsey Thompson has been deemed a candidate for a follow-up tele-health visit to limit community exposure during the Covid-19 pandemic. I spoke with the patient via phone to ensure availability of phone/video source, confirm preferred email & phone number, and discuss instructions and expectations.  I reminded Kelsey Thompson to be prepared with any vital sign and/or  heart rhythm information that could potentially be obtained via home monitoring, at the time of her visit. I reminded Kelsey Thompson to expect a phone call prior to her visit.  Kelsey Thompson 11/01/2018 9:22 AM   INSTRUCTIONS FOR DOWNLOADING THE MYCHART APP TO SMARTPHONE  - The patient must first make sure to have activated MyChart and know their login information - If Apple, go to [employer] Store and type in MyChart in the search bar and download the app. If Android, ask patient to go to [employer] Play Store and type in [place] in the search bar and download the app.  The app is free but as with any other app downloads, their phone may require them to verify saved payment information or Apple/Android password.  - The patient will need to then log into the app with their MyChart username and password, and select [facility] as their healthcare provider to link the account. When it is time for your visit, go to the MyChart app, find appointments, and click Begin Video Visit. Be sure to Select Allow for your device to access the Microphone and Camera for your visit. You will then be connected, and your provider will be with you shortly.  **If they have any issues connecting, or need assistance please contact MyChart service desk [phone])**  **If using a computer, in order to ensure the best quality for their visit they will need to use either of the following Internet Browsers: [employer] Edge, or Google Chrome**  IF USING DOXIMITY or DOXY.ME - The patient will receive a link just prior to their visit by text.     FULL LENGTH CONSENT FOR TELE-HEALTH VISIT   I hereby voluntarily request, consent and authorize [facility] and its employed or contracted physicians, physician assistants, nurse practitioners or other licensed health care professionals (the Practitioner), to provide me with telemedicine health care services (the Services") as deemed necessary by the treating Practitioner. I acknowledge and consent to receive the Services by the Practitioner via telemedicine. I understand that the telemedicine visit will involve communicating with the Practitioner through live audiovisual communication technology and the disclosure of certain medical information by electronic transmission. I acknowledge that I have been given the opportunity to request an in-person assessment or other available alternative prior to the telemedicine visit and am voluntarily participating in the telemedicine visit.  I understand that I have the right to withhold or  withdraw my consent to the use of telemedicine in the course of my care at any time, without affecting my right to future care or treatment, and that the Practitioner or I may terminate the telemedicine visit at any time. I understand that I have the right to inspect all information obtained and/or recorded in the course of the telemedicine visit and may receive copies of available information for a reasonable fee.  I understand that some of the potential risks of receiving the Services via telemedicine include:   Delay or interruption in medical evaluation due to technological equipment failure or disruption;  Information transmitted may not be sufficient (e.g. poor resolution of images) to allow for appropriate medical decision making by the Practitioner; and/or   In rare instances, security protocols could fail, causing a breach of personal health information.  Furthermore, I acknowledge that it is my responsibility to provide information about my medical history, conditions and care that is complete and accurate to the best of my ability. I acknowledge that Practitioner's advice, recommendations, and/or decision may be based  on factors not within their control, such as incomplete or inaccurate data provided by me or distortions of diagnostic images or specimens that may result from electronic transmissions. I understand that the practice of medicine is not an exact science and that Practitioner makes no warranties or guarantees regarding treatment outcomes. I acknowledge that I will receive a copy of this consent concurrently upon execution via email to the email address I last provided but may also request a printed copy by calling the office of [facility].    I understand that my insurance will be billed for this visit.   I have read or had this consent read to me.  I understand the contents of this consent, which adequately explains the benefits and risks of the Services being provided via  telemedicine.   I have been provided ample opportunity to ask questions regarding this consent and the Services and have had my questions answered to my satisfaction.  I give my informed consent for the services to be provided through the use of telemedicine in my medical care  By participating in this telemedicine visit I agree to the above.

## 2018-11-05 ENCOUNTER — Telehealth (INDEPENDENT_AMBULATORY_CARE_PROVIDER_SITE_OTHER): Payer: Medicare Other | Admitting: Internal Medicine

## 2018-11-05 ENCOUNTER — Other Ambulatory Visit: Payer: Self-pay

## 2018-11-05 DIAGNOSIS — Z950 Presence of cardiac pacemaker: Secondary | ICD-10-CM

## 2018-11-05 DIAGNOSIS — I495 Sick sinus syndrome: Secondary | ICD-10-CM

## 2018-11-05 DIAGNOSIS — I480 Paroxysmal atrial fibrillation: Secondary | ICD-10-CM | POA: Diagnosis not present

## 2018-11-05 DIAGNOSIS — I10 Essential (primary) hypertension: Secondary | ICD-10-CM

## 2018-11-05 NOTE — Progress Notes (Signed)
Electrophysiology TeleHealth Note   Due to national recommendations of social distancing due to COVID 19, an audio/video telehealth visit is felt to be most appropriate for this patient at this time.  See MyChart message from today for the patient's consent to telehealth [facility].   Date:  11/05/2018   ID:  Kelsey Thompson, DOB Nov 10, 1920, MRN [id number]  Location: patient's home  Provider location: [address], [place]  Evaluation Performed: Follow-up visit  PCP:  Joette CatchingNyland, Leonard, MD  Cardiologist:  No primary care provider on file.  Electrophysiologist:  Dr Ladona Ridgelaylor  Chief Complaint:  " I have had some knee pain."  History of Present Illness:    Kelsey Thompson is a 85 y.o. female who presents via audio/video conferencing for a telehealth visit today.  She has a h/o sinus node dysfunction, s/p PPM. She has HTN.  Since last being seen in our clinic, the patient reports doing very well.  She is active but requires some help. She has not fallen. Today, she denies symptoms of palpitations, chest pain, shortness of breath,  lower extremity edema, dizziness, presyncope, or syncope.  The patient is otherwise without complaint today.  The patient denies symptoms of fevers, chills, cough, or new SOB worrisome for COVID 19.  Past Medical History:  Diagnosis Date  . Arthritis   . Bradycardia   . GERD (gastroesophageal reflux disease)   . Hiatal hernia   . Hypercholesterolemia   . Hypertension   . Pacemaker     Past Surgical History:  Procedure Laterality Date  . APPENDECTOMY    . CESAREAN SECTION    . PACEMAKER INSERTION    . TONSILLECTOMY      Current Outpatient Medications  Medication Sig Dispense Refill  . amLODipine (NORVASC) 5 MG tablet Take 1 tablet (5 mg total) by mouth daily. 90 tablet 1  . aspirin EC 81 MG tablet Take 81 mg by mouth daily.     . calcium-vitamin D (OSCAL WITH D) 500-200 MG-UNIT per tablet Take 1 tablet by mouth daily.     .  enalapril (VASOTEC) 5 MG tablet Take 1 tablet (5 mg total) by mouth daily. 90 tablet 3  . fish oil-omega-3 fatty acids 1000 MG capsule Take 1 g by mouth daily.     Marland Kitchen. omeprazole (PRILOSEC) 20 MG capsule Take 20 mg by mouth daily.      No current facility-administered medications for this visit.     Allergies:   Horse-derived products and Penicillins   Social History:  The patient  reports that she quit smoking about 37 years ago. Her smoking use included cigarettes. She has never used smokeless tobacco. She reports that she does not drink alcohol or use drugs.   Family History:  The patient's family history includes Diabetes in her father; Heart attack in her father; Heart failure in her mother.   ROS:  Please see the history of present illness.   All other systems are personally reviewed and negative.    Exam:    Vital Signs:  Wt. - 120 lb, P - 72, BP - 144/89    Labs/Other Tests and Data Reviewed:    Recent Labs: No results found for requested labs within last 8760 hours.   Wt Readings from Last 3 Encounters:  09/28/17 123 lb (55.8 kg)  10/31/16 126 lb 6.4 oz (57.3 kg)  04/28/16 125 lb (56.7 kg)     Other studies personally reviewed:  Last device  remote is reviewed from PaceART PDF dated 10/29/18 which reveals normal device function, one episode of atrial fib lasting almost 2 hours.   ASSESSMENT & PLAN:    1.  PAF - I discussed at length with the patient and her daughter the issue of stroke. She is asymptomatic with her atrial fib. She ultimately decided to continue ASA despite my recommendation for low dose eliquis. 2. PPM - her medtronic DDD PPM is working normally. 5 years of longevity. 3. HTN - her blood pressure is a little high today. She just took her meds. 4. COVID 19 screen The patient denies symptoms of COVID 19 at this time.  The importance of social distancing was discussed today.  Follow-up:  12 months Next remote: 7/20  Current medicines are reviewed at  length with the patient today.   The patient does not have concerns regarding her medicines.  The following changes were made today:  none  Labs/ tests ordered today include: none No orders of the defined types were placed in this encounter.    Patient Risk:  after full review of this patients clinical status, I feel that they are at moderate risk at this time.  Today, I have spent 25 minutes with the patient with telehealth technology discussing all of the above.    Signed, Lewayne Bunting, MD  11/05/2018 8:28 AM     [facility] [address] [place] [address] [phone] (office) [phone] (fax)

## 2018-11-06 NOTE — Progress Notes (Signed)
Remote pacemaker transmission.   

## 2018-12-12 ENCOUNTER — Ambulatory Visit: Payer: Medicare Other | Admitting: Family Medicine

## 2019-01-06 ENCOUNTER — Other Ambulatory Visit: Payer: Self-pay

## 2019-01-08 ENCOUNTER — Ambulatory Visit (INDEPENDENT_AMBULATORY_CARE_PROVIDER_SITE_OTHER): Payer: Medicare Other | Admitting: Family Medicine

## 2019-01-08 ENCOUNTER — Other Ambulatory Visit: Payer: Self-pay

## 2019-01-08 ENCOUNTER — Encounter: Payer: Self-pay | Admitting: Family Medicine

## 2019-01-08 VITALS — BP 160/71 | HR 69 | Temp 97.8°F | Ht <= 58 in | Wt 127.0 lb

## 2019-01-08 DIAGNOSIS — K219 Gastro-esophageal reflux disease without esophagitis: Secondary | ICD-10-CM | POA: Diagnosis not present

## 2019-01-08 DIAGNOSIS — Z950 Presence of cardiac pacemaker: Secondary | ICD-10-CM

## 2019-01-08 DIAGNOSIS — I10 Essential (primary) hypertension: Secondary | ICD-10-CM | POA: Diagnosis not present

## 2019-01-08 DIAGNOSIS — I495 Sick sinus syndrome: Secondary | ICD-10-CM | POA: Diagnosis not present

## 2019-01-08 MED ORDER — ENALAPRIL MALEATE 5 MG PO TABS: 5.0000 mg | ORAL_TABLET | Freq: Every day | ORAL | 1 refills | Status: DC

## 2019-01-08 MED ORDER — OMEPRAZOLE 20 MG PO CPDR: 20.0000 mg | DELAYED_RELEASE_CAPSULE | Freq: Every day | ORAL | 1 refills | Status: DC

## 2019-01-08 MED ORDER — AMLODIPINE BESYLATE 5 MG PO TABS: 5.0000 mg | ORAL_TABLET | Freq: Every day | ORAL | 1 refills | Status: DC

## 2019-01-08 NOTE — Patient Instructions (Signed)
Call dr Lovena Le to set up an appointment

## 2019-01-08 NOTE — Progress Notes (Signed)
Subjective:  Patient ID: Kelsey Thompson, female    DOB: December 11, 1920  Age: 85 y.o. MRN: [id number]  CC: Establish Care   HPI Kelsey Thompson presents for new patient evaluation. She has a pacemaker for sick sinus syndrome followed by Dr. Lovena Le of cardiology. She reports puffy ankles today. No dyspnea on exertion. Energy is stable. Pt. Is active for here 96 years. Does minor tasks at home.   Patient in for follow-up of GERD. Currently asymptomatic taking  PPI daily. There is no  heartburn. No hematemesis and no melena. No dysphagia or choking. Onset is remote. Progression is stable. Complicating factors, none.  Follow-up of hypertension. Patient has no history of headache or shortness of breath or recent cough. Patient also denies symptoms of TIA such as numbness weakness lateralizing. Patient checks  blood pressure at home and has not had any elevated readings recently. Patient denies side effects from his medication. States taking it regularly.   Depression screen PHQ 2/9 01/08/2019  Decreased Interest 0  Down, Depressed, Hopeless 0  PHQ - 2 Score 0    History Kelsey Thompson has a past medical history of Arthritis, Bradycardia, GERD (gastroesophageal reflux disease), Hiatal hernia, Hypercholesterolemia, Hypertension, and Pacemaker.   She has a past surgical history that includes Pacemaker insertion; Tonsillectomy; Appendectomy; and Cesarean section.   Her family history includes Diabetes in her father; Heart attack in her father; Heart failure in her mother.She reports that she quit smoking about 37 years ago. Her smoking use included cigarettes. She has never used smokeless tobacco. She reports that she does not drink alcohol or use drugs.    ROS Review of Systems  Constitutional: Negative.   HENT: Negative for congestion.   Eyes: Negative for visual disturbance.  Respiratory: Negative for shortness of breath.   Cardiovascular: Negative for chest pain.  Gastrointestinal: Negative for  abdominal pain, constipation, diarrhea, nausea and vomiting.  Genitourinary: Negative for difficulty urinating.  Musculoskeletal: Negative for arthralgias and myalgias.  Neurological: Negative for headaches.  Psychiatric/Behavioral: Negative for sleep disturbance.    Objective:  BP (!) 160/71   Pulse 69   Temp 97.8 F (36.6 C) (Oral)   Ht '4\' 10"'$  (1.473 m)   Wt 127 lb (57.6 kg)   BMI 26.54 kg/m   BP Readings from Last 3 Encounters:  01/17/19 (!) 142/62  01/08/19 (!) 160/71  09/28/17 130/70    Wt Readings from Last 3 Encounters:  01/16/19 120 lb (54.4 kg)  01/08/19 127 lb (57.6 kg)  09/28/17 123 lb (55.8 kg)     Physical Exam Constitutional:      General: She is not in acute distress.    Appearance: She is well-developed. She is ill-appearing (frail).  HENT:     Head: Normocephalic and atraumatic.     Right Ear: External ear normal.     Left Ear: External ear normal.     Nose: Nose normal.  Eyes:     Conjunctiva/sclera: Conjunctivae normal.     Pupils: Pupils are equal, round, and reactive to light.  Neck:     Musculoskeletal: Normal range of motion and neck supple.     Thyroid: No thyromegaly.  Cardiovascular:     Rate and Rhythm: Normal rate. Rhythm irregular.     Heart sounds: Murmur present. Crescendo  systolic murmur present with a grade of 2/6.  Pulmonary:     Effort: Pulmonary effort is normal. No respiratory distress.     Breath sounds: Normal breath sounds. No wheezing or  rales.  Abdominal:     General: Bowel sounds are normal. There is no distension.     Palpations: Abdomen is soft.     Tenderness: There is no abdominal tenderness.  Musculoskeletal:     Right lower leg: 1+ Edema present.     Left lower leg: 1+ Edema present.  Lymphadenopathy:     Cervical: No cervical adenopathy.  Skin:    General: Skin is warm and dry.  Neurological:     Mental Status: She is alert and oriented to person, place, and time.     Deep Tendon Reflexes: Reflexes are  normal and symmetric.  Psychiatric:        Behavior: Behavior normal.        Thought Content: Thought content normal.        Judgment: Judgment normal.       Assessment & Plan:   Kelsey Thompson was seen today for establish care.  Diagnoses and all orders for this visit:  Essential hypertension -     CMP14+EGFR -     Lipid panel  Cardiac pacemaker in situ -     CBC with Differential/Platelet  Gastroesophageal reflux disease, esophagitis presence not specified  Sinus node dysfunction (HCC)  Other orders -     omeprazole (PRILOSEC) 20 MG capsule; Take 1 capsule (20 mg total) by mouth daily. -     enalapril (VASOTEC) 5 MG tablet; Take 1 tablet (5 mg total) by mouth daily. -     amLODipine (NORVASC) 5 MG tablet; Take 1 tablet (5 mg total) by mouth daily.       I have changed Kelsey Thompson's omeprazole. I am also having her maintain her aspirin EC, fish oil-omega-3 fatty acids, calcium-vitamin D, vitamin C, enalapril, and amLODipine.  Allergies as of 01/08/2019      Reactions   Horse-derived Products Other (See Comments)   Reaction unknown   Penicillins Other (See Comments)   Reaction unknown      Medication List       Accurate as of January 08, 2019 11:59 PM. If you have any questions, ask your nurse or doctor.        amLODipine 5 MG tablet Commonly known as: NORVASC Take 1 tablet (5 mg total) by mouth daily.   aspirin EC 81 MG tablet Take 81 mg by mouth daily.   calcium-vitamin D 500-200 MG-UNIT tablet Commonly known as: OSCAL WITH D Take 1 tablet by mouth daily.   enalapril 5 MG tablet Commonly known as: VASOTEC Take 1 tablet (5 mg total) by mouth daily.   fish oil-omega-3 fatty acids 1000 MG capsule Take 1 g by mouth daily.   omeprazole 20 MG capsule Commonly known as: PRILOSEC Take 1 capsule (20 mg total) by mouth daily.   vitamin C 500 MG tablet Commonly known as: ASCORBIC ACID Take 500 mg by mouth daily.        Follow-up: Return in about 3  months (around 04/10/2019), or if symptoms worsen or fail to improve.  Claretta Fraise, M.D.

## 2019-01-09 LAB — CMP14+EGFR
ALT: 12 IU/L (ref 0–32)
AST: 16 IU/L (ref 0–40)
Albumin/Globulin Ratio: 1.5 (ref 1.2–2.2)
Albumin: 4.2 g/dL (ref 3.5–4.6)
Alkaline Phosphatase: 83 IU/L (ref 39–117)
BUN/Creatinine Ratio: 19 (ref 12–28)
BUN: 16 mg/dL (ref 10–36)
Bilirubin Total: 0.2 mg/dL (ref 0.0–1.2)
CO2: 24 mmol/L (ref 20–29)
Calcium: 9.4 mg/dL (ref 8.7–10.3)
Chloride: 95 mmol/L — ABNORMAL LOW (ref 96–106)
Creatinine, Ser: 0.84 mg/dL (ref 0.57–1.00)
GFR calc Af Amer: 68 mL/min/{1.73_m2} (ref >59)
GFR calc non Af Amer: 59 mL/min/{1.73_m2} — ABNORMAL LOW (ref >59)
Globulin, Total: 2.8 g/dL (ref 1.5–4.5)
Glucose: 86 mg/dL (ref 65–99)
Potassium: 4.7 mmol/L (ref 3.5–5.2)
Sodium: 133 mmol/L — ABNORMAL LOW (ref 134–144)
Total Protein: 7 g/dL (ref 6.0–8.5)

## 2019-01-09 LAB — CBC WITH DIFFERENTIAL/PLATELET
Basophils Absolute: 0.1 10*3/uL (ref 0.0–0.2)
Basos: 1 %
EOS (ABSOLUTE): 0.2 10*3/uL (ref 0.0–0.4)
Eos: 3 %
Hematocrit: 30.9 % — ABNORMAL LOW (ref 34.0–46.6)
Hemoglobin: 10.2 g/dL — ABNORMAL LOW (ref 11.1–15.9)
Immature Grans (Abs): 0 10*3/uL (ref 0.0–0.1)
Immature Granulocytes: 0 %
Lymphocytes Absolute: 1.7 10*3/uL (ref 0.7–3.1)
Lymphs: 27 %
MCH: 27.6 pg (ref 26.6–33.0)
MCHC: 33 g/dL (ref 31.5–35.7)
MCV: 84 fL (ref 79–97)
Monocytes Absolute: 0.9 10*3/uL (ref 0.1–0.9)
Monocytes: 14 %
Neutrophils Absolute: 3.5 10*3/uL (ref 1.4–7.0)
Neutrophils: 55 %
Platelets: 335 10*3/uL (ref 150–450)
RBC: 3.69 x10E6/uL — ABNORMAL LOW (ref 3.77–5.28)
RDW: 14.4 % (ref 11.7–15.4)
WBC: 6.4 10*3/uL (ref 3.4–10.8)

## 2019-01-09 LAB — LIPID PANEL
Chol/HDL Ratio: 2.6 ratio (ref 0.0–4.4)
Cholesterol, Total: 164 mg/dL (ref 100–199)
HDL: 63 mg/dL (ref >39)
LDL Calculated: 86 mg/dL (ref 0–99)
Triglycerides: 76 mg/dL (ref 0–149)
VLDL Cholesterol Cal: 15 mg/dL (ref 5–40)

## 2019-01-16 ENCOUNTER — Inpatient Hospital Stay (HOSPITAL_COMMUNITY)
Admission: EM | Admit: 2019-01-16 | Discharge: 2019-01-21 | DRG: 062 | Disposition: A | Discharge status: Skilled Nursing Facility | Payer: Medicare Other | Attending: Neurology | Admitting: Neurology

## 2019-01-16 ENCOUNTER — Emergency Department (HOSPITAL_COMMUNITY): Payer: Medicare Other

## 2019-01-16 ENCOUNTER — Inpatient Hospital Stay (HOSPITAL_COMMUNITY): Payer: Medicare Other

## 2019-01-16 ENCOUNTER — Other Ambulatory Visit: Payer: Self-pay

## 2019-01-16 DIAGNOSIS — I63411 Cerebral infarction due to embolism of right middle cerebral artery: Secondary | ICD-10-CM | POA: Diagnosis not present

## 2019-01-16 DIAGNOSIS — Z87891 Personal history of nicotine dependence: Secondary | ICD-10-CM | POA: Diagnosis not present

## 2019-01-16 DIAGNOSIS — I480 Paroxysmal atrial fibrillation: Secondary | ICD-10-CM | POA: Diagnosis not present

## 2019-01-16 DIAGNOSIS — G459 Transient cerebral ischemic attack, unspecified: Secondary | ICD-10-CM | POA: Diagnosis not present

## 2019-01-16 DIAGNOSIS — Z8249 Family history of ischemic heart disease and other diseases of the circulatory system: Secondary | ICD-10-CM | POA: Diagnosis not present

## 2019-01-16 DIAGNOSIS — R54 Age-related physical debility: Secondary | ICD-10-CM | POA: Diagnosis present

## 2019-01-16 DIAGNOSIS — K219 Gastro-esophageal reflux disease without esophagitis: Secondary | ICD-10-CM | POA: Diagnosis not present

## 2019-01-16 DIAGNOSIS — I69392 Facial weakness following cerebral infarction: Secondary | ICD-10-CM | POA: Diagnosis not present

## 2019-01-16 DIAGNOSIS — I361 Nonrheumatic tricuspid (valve) insufficiency: Secondary | ICD-10-CM | POA: Diagnosis not present

## 2019-01-16 DIAGNOSIS — E871 Hypo-osmolality and hyponatremia: Secondary | ICD-10-CM | POA: Diagnosis not present

## 2019-01-16 DIAGNOSIS — I495 Sick sinus syndrome: Secondary | ICD-10-CM | POA: Diagnosis not present

## 2019-01-16 DIAGNOSIS — R402411 Glasgow coma scale score 13-15, in the field [EMT or ambulance]: Secondary | ICD-10-CM | POA: Diagnosis not present

## 2019-01-16 DIAGNOSIS — Z7982 Long term (current) use of aspirin: Secondary | ICD-10-CM

## 2019-01-16 DIAGNOSIS — R001 Bradycardia, unspecified: Secondary | ICD-10-CM | POA: Diagnosis present

## 2019-01-16 DIAGNOSIS — R29810 Facial weakness: Secondary | ICD-10-CM | POA: Diagnosis not present

## 2019-01-16 DIAGNOSIS — I639 Cerebral infarction, unspecified: Secondary | ICD-10-CM | POA: Diagnosis not present

## 2019-01-16 DIAGNOSIS — I6522 Occlusion and stenosis of left carotid artery: Secondary | ICD-10-CM | POA: Diagnosis present

## 2019-01-16 DIAGNOSIS — R202 Paresthesia of skin: Secondary | ICD-10-CM | POA: Diagnosis not present

## 2019-01-16 DIAGNOSIS — Z950 Presence of cardiac pacemaker: Secondary | ICD-10-CM | POA: Diagnosis present

## 2019-01-16 DIAGNOSIS — R414 Neurologic neglect syndrome: Secondary | ICD-10-CM | POA: Diagnosis not present

## 2019-01-16 DIAGNOSIS — M1990 Unspecified osteoarthritis, unspecified site: Secondary | ICD-10-CM | POA: Diagnosis not present

## 2019-01-16 DIAGNOSIS — R29818 Other symptoms and signs involving the nervous system: Secondary | ICD-10-CM | POA: Diagnosis not present

## 2019-01-16 DIAGNOSIS — Z1159 Encounter for screening for other viral diseases: Secondary | ICD-10-CM | POA: Diagnosis not present

## 2019-01-16 DIAGNOSIS — I6300 Cerebral infarction due to thrombosis of unspecified precerebral artery: Secondary | ICD-10-CM | POA: Diagnosis not present

## 2019-01-16 DIAGNOSIS — I340 Nonrheumatic mitral (valve) insufficiency: Secondary | ICD-10-CM | POA: Diagnosis not present

## 2019-01-16 DIAGNOSIS — R4020 Unspecified coma: Secondary | ICD-10-CM | POA: Diagnosis not present

## 2019-01-16 DIAGNOSIS — I10 Essential (primary) hypertension: Secondary | ICD-10-CM | POA: Diagnosis not present

## 2019-01-16 DIAGNOSIS — R29704 NIHSS score 4: Secondary | ICD-10-CM | POA: Diagnosis not present

## 2019-01-16 DIAGNOSIS — E785 Hyperlipidemia, unspecified: Secondary | ICD-10-CM | POA: Diagnosis present

## 2019-01-16 DIAGNOSIS — R410 Disorientation, unspecified: Secondary | ICD-10-CM | POA: Diagnosis not present

## 2019-01-16 DIAGNOSIS — I63421 Cerebral infarction due to embolism of right anterior cerebral artery: Secondary | ICD-10-CM

## 2019-01-16 DIAGNOSIS — I63233 Cerebral infarction due to unspecified occlusion or stenosis of bilateral carotid arteries: Secondary | ICD-10-CM | POA: Diagnosis not present

## 2019-01-16 DIAGNOSIS — I6310 Cerebral infarction due to embolism of unspecified precerebral artery: Secondary | ICD-10-CM | POA: Diagnosis not present

## 2019-01-16 DIAGNOSIS — R0689 Other abnormalities of breathing: Secondary | ICD-10-CM | POA: Diagnosis not present

## 2019-01-16 DIAGNOSIS — R278 Other lack of coordination: Secondary | ICD-10-CM | POA: Diagnosis not present

## 2019-01-16 DIAGNOSIS — R4701 Aphasia: Secondary | ICD-10-CM | POA: Diagnosis not present

## 2019-01-16 DIAGNOSIS — Z03818 Encounter for observation for suspected exposure to other biological agents ruled out: Secondary | ICD-10-CM | POA: Diagnosis not present

## 2019-01-16 DIAGNOSIS — Z833 Family history of diabetes mellitus: Secondary | ICD-10-CM

## 2019-01-16 DIAGNOSIS — M6281 Muscle weakness (generalized): Secondary | ICD-10-CM | POA: Diagnosis not present

## 2019-01-16 DIAGNOSIS — I69398 Other sequelae of cerebral infarction: Secondary | ICD-10-CM | POA: Diagnosis not present

## 2019-01-16 LAB — URINALYSIS, ROUTINE W REFLEX MICROSCOPIC
Bilirubin Urine: NEGATIVE
Glucose, UA: 50 mg/dL — AB
Hgb urine dipstick: NEGATIVE
Ketones, ur: NEGATIVE mg/dL
Leukocytes,Ua: NEGATIVE
Nitrite: NEGATIVE
Protein, ur: NEGATIVE mg/dL
Specific Gravity, Urine: 1.028 (ref 1.005–1.030)
pH: 6 (ref 5.0–8.0)

## 2019-01-16 LAB — CBC
HCT: 30.9 % — ABNORMAL LOW (ref 36.0–46.0)
Hemoglobin: 10.1 g/dL — ABNORMAL LOW (ref 12.0–15.0)
MCH: 27.4 pg (ref 26.0–34.0)
MCHC: 32.7 g/dL (ref 30.0–36.0)
MCV: 83.7 fL (ref 80.0–100.0)
Platelets: 304 10*3/uL (ref 150–400)
RBC: 3.69 MIL/uL — ABNORMAL LOW (ref 3.87–5.11)
RDW: 14.9 % (ref 11.5–15.5)
WBC: 5.3 10*3/uL (ref 4.0–10.5)
nRBC: 0 % (ref 0.0–0.2)

## 2019-01-16 LAB — COMPREHENSIVE METABOLIC PANEL
ALT: 15 U/L (ref 0–44)
AST: 19 U/L (ref 15–41)
Albumin: 3.8 g/dL (ref 3.5–5.0)
Alkaline Phosphatase: 81 U/L (ref 38–126)
Anion gap: 9 (ref 5–15)
BUN: 24 mg/dL — ABNORMAL HIGH (ref 8–23)
CO2: 22 mmol/L (ref 22–32)
Calcium: 8.9 mg/dL (ref 8.9–10.3)
Chloride: 99 mmol/L (ref 98–111)
Creatinine, Ser: 1.01 mg/dL — ABNORMAL HIGH (ref 0.44–1.00)
GFR calc Af Amer: 54 mL/min — ABNORMAL LOW (ref >60)
GFR calc non Af Amer: 47 mL/min — ABNORMAL LOW (ref >60)
Glucose, Bld: 181 mg/dL — ABNORMAL HIGH (ref 70–99)
Potassium: 3.9 mmol/L (ref 3.5–5.1)
Sodium: 130 mmol/L — ABNORMAL LOW (ref 135–145)
Total Bilirubin: 0.4 mg/dL (ref 0.3–1.2)
Total Protein: 7.4 g/dL (ref 6.5–8.1)

## 2019-01-16 LAB — SARS CORONAVIRUS 2 BY RT PCR (HOSPITAL ORDER, PERFORMED IN ~~LOC~~ HOSPITAL LAB): SARS Coronavirus 2: NEGATIVE

## 2019-01-16 LAB — DIFFERENTIAL
Abs Immature Granulocytes: 0.02 10*3/uL (ref 0.00–0.07)
Basophils Absolute: 0.1 10*3/uL (ref 0.0–0.1)
Basophils Relative: 1 %
Eosinophils Absolute: 0.2 10*3/uL (ref 0.0–0.5)
Eosinophils Relative: 4 %
Immature Granulocytes: 0 %
Lymphocytes Relative: 38 %
Lymphs Abs: 2 10*3/uL (ref 0.7–4.0)
Monocytes Absolute: 0.8 10*3/uL (ref 0.1–1.0)
Monocytes Relative: 15 %
Neutro Abs: 2.2 10*3/uL (ref 1.7–7.7)
Neutrophils Relative %: 42 %

## 2019-01-16 LAB — RAPID URINE DRUG SCREEN, HOSP PERFORMED
Amphetamines: NOT DETECTED
Barbiturates: NOT DETECTED
Benzodiazepines: NOT DETECTED
Cocaine: NOT DETECTED
Opiates: NOT DETECTED
Tetrahydrocannabinol: NOT DETECTED

## 2019-01-16 LAB — APTT: aPTT: 27 seconds (ref 24–36)

## 2019-01-16 LAB — ETHANOL: Alcohol, Ethyl (B): <10 mg/dL (ref <10)

## 2019-01-16 LAB — PROTIME-INR
INR: 1 (ref 0.8–1.2)
Prothrombin Time: 12.9 seconds (ref 11.4–15.2)

## 2019-01-16 MED ORDER — STROKE: EARLY STAGES OF RECOVERY BOOK
Freq: Once | Status: AC
  Administered 2019-01-17: 1
  Filled 2019-01-16: qty 1

## 2019-01-16 MED ORDER — CLEVIDIPINE BUTYRATE 0.5 MG/ML IV EMUL: 0.0000 mg/h | INTRAVENOUS | Status: DC

## 2019-01-16 MED ORDER — ENALAPRIL MALEATE 5 MG PO TABS
5.0000 mg | ORAL_TABLET | Freq: Every day | ORAL | Status: DC
  Administered 2019-01-17 – 2019-01-21 (×5): 5 mg via ORAL
  Filled 2019-01-16 (×6): qty 1

## 2019-01-16 MED ORDER — PANTOPRAZOLE SODIUM 40 MG IV SOLR
40.0000 mg | Freq: Every day | INTRAVENOUS | Status: DC
  Administered 2019-01-17: 40 mg via INTRAVENOUS
  Filled 2019-01-16: qty 40

## 2019-01-16 MED ORDER — CHLORHEXIDINE GLUCONATE CLOTH 2 % EX PADS
6.0000 | MEDICATED_PAD | Freq: Every day | CUTANEOUS | Status: DC
  Administered 2019-01-17 – 2019-01-20 (×4): 6 via TOPICAL

## 2019-01-16 MED ORDER — ACETAMINOPHEN 325 MG PO TABS: 650.0000 mg | ORAL_TABLET | ORAL | Status: DC | PRN

## 2019-01-16 MED ORDER — ALTEPLASE 100 MG IV SOLR
INTRAVENOUS | Status: AC
  Filled 2019-01-16: qty 100

## 2019-01-16 MED ORDER — IOHEXOL 350 MG/ML SOLN
115.0000 mL | Freq: Once | INTRAVENOUS | Status: AC | PRN
  Administered 2019-01-16: 115 mL via INTRAVENOUS

## 2019-01-16 MED ORDER — ALTEPLASE (STROKE) FULL DOSE INFUSION
0.9000 mg/kg | Freq: Once | INTRAVENOUS | Status: AC
  Administered 2019-01-16: 49 mg via INTRAVENOUS
  Filled 2019-01-16: qty 100

## 2019-01-16 MED ORDER — SENNOSIDES-DOCUSATE SODIUM 8.6-50 MG PO TABS: 1.0000 | ORAL_TABLET | Freq: Every evening | ORAL | Status: DC | PRN

## 2019-01-16 MED ORDER — LABETALOL HCL 5 MG/ML IV SOLN: 20.0000 mg | Freq: Once | INTRAVENOUS | Status: DC

## 2019-01-16 MED ORDER — ACETAMINOPHEN 160 MG/5ML PO SOLN: 650.0000 mg | ORAL | Status: DC | PRN

## 2019-01-16 MED ORDER — SODIUM CHLORIDE 0.9 % IV SOLN
INTRAVENOUS | Status: DC
  Administered 2019-01-17: via INTRAVENOUS

## 2019-01-16 MED ORDER — SODIUM CHLORIDE 0.9 % IV SOLN
50.0000 mL | Freq: Once | INTRAVENOUS | Status: AC
  Administered 2019-01-16: 50 mL via INTRAVENOUS

## 2019-01-16 MED ORDER — AMLODIPINE BESYLATE 5 MG PO TABS
5.0000 mg | ORAL_TABLET | Freq: Every day | ORAL | Status: DC
  Administered 2019-01-17 – 2019-01-21 (×5): 5 mg via ORAL
  Filled 2019-01-16 (×5): qty 1

## 2019-01-16 MED ORDER — ACETAMINOPHEN 650 MG RE SUPP: 650.0000 mg | RECTAL | Status: DC | PRN

## 2019-01-16 NOTE — ED Notes (Signed)
Belongings given to daughter- clothes, and jewelry

## 2019-01-16 NOTE — ED Notes (Signed)
[facility] arrived. 

## 2019-01-16 NOTE — ED Notes (Signed)
EDP notified pt confused, pt previously a&o

## 2019-01-16 NOTE — ED Notes (Signed)
TPA delayed due to family and pt having trouble deciding on what they want to do- TPA or not

## 2019-01-16 NOTE — ED Notes (Signed)
Dr Cheral Marker called and updated on the patient's neuro. Change

## 2019-01-16 NOTE — ED Provider Notes (Signed)
[facility] EMERGENCY DEPARTMENT Provider Note   CSN: [id number] Arrival date & time: 01/16/19  1815     History   Chief Complaint Chief Complaint  Patient presents with   Code Stroke    HPI Kelsey Thompson is a 85 y.o. female.     Patient is a 85 year old female with past medical history of hypertension, cardiac pacemaker secondary to sick sinus, GERD.  She presents today by EMS for evaluation of left-sided weakness which began approximately 1 hour prior to arrival while she was sitting down for dinner at a [employer].  He describes weakness of her left hand.  She denies any headache or visual disturbances.  He was transported here by EMS as a code stroke.  The history is provided by the patient.    Past Medical History:  Diagnosis Date   Arthritis    Bradycardia    GERD (gastroesophageal reflux disease)    Hiatal hernia    Hypercholesterolemia    Hypertension    Pacemaker     Patient Active Problem List   Diagnosis Date Noted   Gonalgia 07/17/2014   Sinoatrial node dysfunction ([place]) 09/26/2011   CARDIAC PACEMAKER IN SITU 11/19/2008   Essential hypertension 11/07/2008   BRADYCARDIA 11/07/2008   GERD 11/07/2008   HIATAL HERNIA 11/07/2008    Past Surgical History:  Procedure Laterality Date   APPENDECTOMY     CESAREAN SECTION     PACEMAKER INSERTION     TONSILLECTOMY       OB History   No obstetric history on file.      Home Medications    Prior to Admission medications   Medication Sig Start Date End Date Taking? Authorizing Provider  amLODipine (NORVASC) 5 MG tablet Take 1 tablet (5 mg total) by mouth daily. 01/08/19   Claretta Fraise, MD  aspirin EC 81 MG tablet Take 81 mg by mouth daily.     [provider]  calcium-vitamin D (OSCAL WITH D) 500-200 MG-UNIT per tablet Take 1 tablet by mouth daily.     [provider]  enalapril (VASOTEC) 5 MG tablet Take 1 tablet (5 mg total) by mouth daily. 01/08/19   Claretta Fraise, MD  fish oil-omega-3 fatty acids 1000 MG capsule Take 1 g by mouth daily.     [provider]  omeprazole (PRILOSEC) 20 MG capsule Take 1 capsule (20 mg total) by mouth daily. 01/08/19   Claretta Fraise, MD  vitamin C (ASCORBIC ACID) 500 MG tablet Take 500 mg by mouth daily.    [provider]    Family History Family History  Problem Relation Age of Onset   Heart failure Mother    Diabetes Father    Heart attack Father     Social History Social History   Tobacco Use   Smoking status: Former Smoker    Types: Cigarettes    Quit date: 06/30/1981    Years since quitting: 37.5   Smokeless tobacco: Never Used  Substance Use Topics   Alcohol use: No    Alcohol/week: 0.0 standard drinks   Drug use: No     Allergies   Horse-derived products and Penicillins   Review of Systems Review of Systems  All other systems reviewed and are negative.    Physical Exam Updated Vital Signs BP (!) 164/76 (BP Location: Right Arm)    Pulse 87    Temp 97.7 F (36.5 C) (Oral)    Resp 20    Ht 5\' 3"  (  1.6 m)    Wt 54.4 kg    SpO2 96%    BMI 21.26 kg/m   Physical Exam Vitals signs and nursing note reviewed.  Constitutional:      General: She is not in acute distress.    Appearance: She is well-developed. She is not diaphoretic.  HENT:     Head: Normocephalic and atraumatic.  Eyes:     Extraocular Movements: Extraocular movements intact.     Pupils: Pupils are equal, round, and reactive to light.  Neck:     Musculoskeletal: Normal range of motion and neck supple.  Cardiovascular:     Rate and Rhythm: Normal rate and regular rhythm.     Heart sounds: No murmur. No friction rub. No gallop.   Pulmonary:     Effort: Pulmonary effort is normal. No respiratory distress.     Breath sounds: Normal breath sounds. No wheezing.  Abdominal:     General: Bowel sounds are normal. There is no distension.     Palpations: Abdomen is soft.     Tenderness: There is no  abdominal tenderness.  Musculoskeletal: Normal range of motion.  Skin:    General: Skin is warm and dry.  Neurological:     Mental Status: She is alert and oriented to person, place, and time.     Cranial Nerves: No cranial nerve deficit.     Sensory: No sensory deficit.     Motor: Weakness present.     Coordination: Coordination abnormal.     Comments: Patient has 3+ out of 5 strength on the left upper extremity.  She is 5 out of 5 throughout the remainder of exam.  Patient having difficulty carrying out finger-to-nose due to the weakness in her left arm.      ED Treatments / Results  Labs (all labs ordered are listed, but only abnormal results are displayed) Labs Reviewed  ETHANOL  PROTIME-INR  APTT  CBC  DIFFERENTIAL  COMPREHENSIVE METABOLIC PANEL  RAPID URINE DRUG SCREEN, HOSP PERFORMED  URINALYSIS, ROUTINE W REFLEX MICROSCOPIC  I-STAT CHEM 8, ED    EKG EKG Interpretation  Date/Time:  Thursday January 16 2019 18:46:33 EDT Ventricular Rate:  71 PR Interval:    QRS Duration: 100 QT Interval:  378 QTC Calculation: 411 R Axis:   -69 Text Interpretation:  Ectopic atrial rhythm Ventricular premature complex Left anterior fascicular block Consider anterior infarct Confirmed by Geoffery LyonseLo, Gotti Alwin ([id number]) on 01/16/2019 8:01:37 PM   Radiology No results found.  Procedures Procedures (including critical care time)  Medications Ordered in ED Medications - No data to display   Initial Impression / Assessment and Plan / ED Course  I have reviewed the triage vital signs and the nursing notes.  Pertinent labs & imaging results that were available during my care of the patient were reviewed by me and considered in my medical decision making (see chart for details).  Patient brought by EMS from a local restaurant for evaluation of acute onset of left arm paralysis.  Patient's left arm became weak and she could not move it.  911 was called and the patient was transported here.  She  did have some improvement in the degree of weakness prior to arrival.  Patient arrived here as a code stroke and was evaluated by myself immediately along with the code stroke team.  Patient was then sent for a CT scan which was read as normal, then underwent consultation with tele-neurology.  TPA was recommended and administered.  This decision  was made in consultation with the tele-neurologist, the patient, and the patient's daughter.  At the recommendation of tele-neurology, the patient then went for CTA of the head and neck along with a cerebral perfusion scan.  The results of this are currently pending.  I have discussed the care with Dr. Ot[facility]Lindzen [facility]  He has agreed to accept the patient in transfer to the neuro ICU.  Patient will be transported there once the results of the testing is known.  CRITICAL CARE Performed by: Geoffery Lyonsouglas Katilin Raynes Total critical care time: 45 minutes Critical care time was exclusive of separately billable procedures and treating other patients. Critical care was necessary to treat or prevent imminent or life-threatening deterioration. Critical care was time spent personally by me on the following activities: development of treatment plan with patient and/or surrogate as well as nursing, discussions with consultants, evaluation of patient's response to treatment, examination of patient, obtaining history from patient or surrogate, ordering and performing treatments and interventions, ordering and review of laboratory studies, ordering and review of radiographic studies, pulse oximetry and re-evaluation of patient's condition.   Final Clinical Impressions(s) / ED Diagnoses   Final diagnoses:  None    ED Discharge Orders    None       Geoffery Lyonselo, Pollyanna Levay, MD 01/16/19 2046

## 2019-01-16 NOTE — ED Triage Notes (Signed)
Patient presents to the ED as CODE STROKE. Patient was eating at the [place] and had onset of left side paralysis at approximately 1720. Patient has a Psychologist, forensic and is alert and oriented x4. Unknown if on thinner. NO facial droop other symptoms. Patient now complains of right hand numbness.

## 2019-01-16 NOTE — Consult Note (Addendum)
[facility] TeleNeurology Consult Services   TeleStroke Metrics: LKW: [id number] Door Time: 1815  [facility] Contacted: 1841  [facility] at Bedside: 1855 NIHSS (assessment time): 1905   Interventional Candidate: Likely not a candidate as her symptoms are not consistent with a large vessel proximal occlusion.  CTA head and neck are pending.   Chief Complaint: Left-sided weakness and confusion   HPI: Asked to see this patient in emergent telemedicine consultation utilizing interactive audio and video technologies. Consultation was performed with assistance of ancillary / medical staff at bedside. Verbal consent to perform the examination with telemedicine was obtained. Patient agreed to proceed with the consultation for acute stroke protocol.   85 year old right-handed white female who comes to the emergency room by EMS as a stroke alert for acute confusion and left sided weakness.  Patient's daughter is at bedside.  Patient currently takes a baby aspirin.  She has never had a stroke before.  Apparently, the patient has a history of pacemaker placement for sick sinus syndrome that was placed approximately 5 years ago.  Daughter states that the patient recently had her pacemaker interrogated, and there was noted to be an underlying arrhythmia that could have been paroxysmal atrial fibrillation.  Cardiology had recommended to start the patient on Eliquis, but the patient overall declined.  Daughter was with the patient this evening.  At 1720, the patient became confused and her left arm was completely flaccid.  She called 911.  Currently on examination, the patient has improved but still has moderate left arm weakness and some left-sided numbness.  Head CT was negative.  Daughter did report about 2 to 3 weeks ago patient having one episode of rectal bleeding.  She had a hemoglobin done on 01/08/2019 that was 10.2.  Hemoglobin currently today is 10.1 and stable.  Daughter states there has been  no further episodes of rectal bleeding.  At the end of the day, daughter wanted to be aggressive and wanted to proceed with IV alteplase administration.   Alteplase (tPA) Administration: Risk and benefit of IV Alteplase were discussed. Risk includes a 6% chance of symptomatic intracranial hemorrhage. Benefit includes an approximate 30% chance of improving at 3 months with the medication versus a 20% chance of improving at 3 months without the medication. Inclusion criteria were reviewed. Exclusion criteria were reviewed and are all negative.  No recent issues with internal bleeding.  No recent surgeries.   Verbal Consent to Alteplase (tPA):    I have explained to the patient and family the nature of the patient's condition, the use of tPA fibrinolytic agent, and the benefits to be reasonably expected compared with alternative approaches. I have discussed the likelihood of major risks or complications of this procedure including (if applicable) but not limited to loss of limb function, brain damage, paralysis, hemorrhage, infection, complications from transfusion of blood components, drug reactions, blood clots and loss of life. I have also indicated that with any procedure there is always the possibility of an unexpected complication. I have explained the risks which include: 1. Death, Stroke or permanent neurologic injury (paralysis, coma, etc) 2. Worsening of stroke symptoms from swelling or bleeding in the brain 3. Bleeding in other parts of the body 4. Need for blood transfusions to replace blood or clotting factors 5. Allergic reaction to medications 6. Other unexpected complications  All questions were answered and the patient and family expressed understanding of the treatment plan and consented to the procedure.   Verbal Consent/Order: 1916 Alteplase Ordered: 1920 Alteplase Total  Dose: 49 mg (weight 54.4 kg) Alteplase IV Bolus Dose: 5 mg given at 1932 Alteplase IV Infusion Dose: 44 mg  started thereafter   Vial of Alteplase was visually confirmed by myself. Dosing of IV Alteplase was reviewed by myself and with the ER nurse prior to Alteplase administration. Blood pressure Pre-Alteplase Administration: 181/71 at 1931   PMH: Hypertension, hyperlipidemia, sick sinus syndrome status post pacemaker placement, GERD, osteoarthritis, paroxysmal atrial fibrillation   SOC: Negative x3.  Patient smoked before in the past.  She lives alone.   FMH: Significant for diabetes mellitus, stroke, and heart disease   ROS: 13 point review of systems were reviewed with the patient, and are all negative with the exception of the aforementioned in the history of present illness.   VS: Temperature 97.7 F, pulse 74, respirations 16, blood pressure 152/67, oxygen saturation 94%   Exam: Patient is in no apparent distress.  Patient appears as stated age.  No obvious acute respiratory or cardiac distress.  Patient is well groomed and well-nourished. 1a- LOC: Keenly responsive - 0 1b- LOC questions: Answers both questions correctly - 0 1c- LOC commands- Performs both tasks correctly- 0 2- Gaze: Normal; no gaze paresis or gaze deviation - 0 3- Visual Fields: normal, no Visual field deficit - 0 4- Facial movements: no facial palsy - 0 5- Upper limb motor - left arm drift - 2 6- Lower limb motor - left leg drift - 1 7- Limb Coordination: absent ataxia - 0 8- Sensory: left arm and leg sensory loss - 1 9- Language - No aphasia - 0 10- Speech - No dysarthria -0 11- Neglect / Extinction - none found - 0 NIHSS score: 4   Diagnostic Data: CT head showed no acute intracranial process  Alcohol level negative, coagulation studies within normal limits, hemoglobin on 01/08/2019 was 10.2, current hemoglobin 10.1, WBC 5.3, platelets 304, blood glucose 181, sodium 130, potassium 3.9, BUN 24, creatinine 1.01  Medical Data Reviewed: 1.Data?reviewed include clinical labs, radiology,?and medical  tests; 2.Tests?results discussed w/performing or interpreting physician; 3.Obtaining/reviewing old medical records; 4.Obtaining?case history from another source; 5.Independent?review of image, tracing, or specimen.   Medical Decision Making: - Extensive number of diagnosis or management options are considered below. - Extensive amount of complex data reviewed. - High risk of complication and/or morbidity or mortality are associated with differential diagnostic considerations below. - There may be?uncertain?outcome and increased probability of prolonged functional impairment or high probability of severe prolonged functional impairment associated with some of these differential diagnosis.   Differential Diagnosis for Stroke: 1.?Cardioembolic?stroke 2. Small vessel disease/lacune 3. Thromboembolic, artery-to-artery mechanism 4.?Hypercoagulable?state-related infarct 5. Transient ischemic attack 6. Thrombotic mechanism, large artery disease   Assessment: 1.  Acute right MCA embolic stroke status post IV alteplase 2.  Paroxysmal atrial fibrillation 3.  Sick sinus syndrome status post pacemaker placement 4.  Hypertension 5.  Hyperlipidemia 6.  GERD 7.  Osteoarthritis  Recommendations: Patient should be admitted to the inpatient hospitalist service and be monitored in the ICU. Monitor and document vital signs/blood pressure with neuro checks/NIHSS for the first 24 hours after receiving IV Alteplase using the following parameters: -Check every 15 minutes for 2 hours following IV Alteplase administration -Then check every 30 minutes for 6 hours -Then check every 1 hour for 16 hours Allow permissive hypertension. If SBP > 180 or DBP > 105 on 2 consecutive checks, then administer PRN IV anti-hypertensive medication (Labetalol 10-20 mg IV over 1-2 minutes or start Nicardipine drip at 5 mg/hr  and can titrate up every 2.5 mg/hr until parameters are achieved) and notify MD. No anti-platelets or  Lovenox for 24 hours s/p IV Alteplase per protocol. Place SCDs for DVT prevention. Repeat head CT in 24 hours s/p IV Alteplase per protocol. Obtain STAT head CT for any new acute headache or new neurological deficits Continue telemetry monitoring to look for paroxysmal atrial fibrillation. Check echocardiogram. Check brain MRI. Consult PT, OT, and ST. Check hemoglobin A1c and lipid panel. Would recommend to consult local Neurology provider to see patient in follow-up consultation on the floor. Plan of care was discussed with the patient.   Thank you for allowing [facility] to participate in the care of your patient. Please call me, Dr. Dale Mascot, with any questions at [phone]. Case discussed with the ER staff and Dr. Stark Jock.   Critical Care notation:   I was called to see this critical patient emergently. I personally evaluated this critical patient for acute stroke evaluation, and determining their eligibility for IV Alteplase and interventional therapies.  I have spent approximately 39 minutes with the patient, including time at bedside, time discussing the case with other physicians, reviewing plan of care, and time independently reviewing the records and scans.  Addendum at 2026 (01/16/2019):  CT perfusion study showed no large acute perfusional deficits.  CTA head showed patent proximal bilateral MCA vessels and basilar artery.  Formal reports are pending. Patient is therefore not an interventional candidate.

## 2019-01-16 NOTE — H&P (Signed)
Admission H&P    Chief Complaint: Left sided weakness  HPI: Kelsey Thompson is an 85 y.o. female with a PMHx of HTN and cardiac pacemaker for SSS, bradycardia, HTN and hypercholesterolemia, who presented to the [facility] via EMS for acute onset of left sided weakness. This occurred at 1720, while at a restaurant. At onset, she became confused and LUE was completely flaccid. She was alert and fully oriented on arrival, describing weakness of her left hand. On Teleneurology assessment the LUE had improved but there was still moderate LUE weakness and some left-sided numbness. NIHSS was 4.   CT head at the [facility] ED showed no acute abnormality. Atrophy and extensive chronic microvascular ischemic changes were noted. ASPECTS was 10.  Teleneurology consultant recommended tPA for probable acute right MCA embolic stroke, which patient consented to.   During tPA infusion a CTA of head and neck with CTP was performed, revealing no LVO or perfusion deficit. 40% diameter stenosis of the proximal left internal carotid artery was noted, with right carotid and both vertebral arteries noted to be widely patent. Mild atherosclerotic stenosis in the cavernous carotid bilaterally and 50% diameter stenosis origin of innominate artery also seen.  At approximately 9:50 PM the patient was sent back to CT for an acute neuro change, to rule out possible hemorrhagic complication of tPA. Repeat CT was negative for hemorrhage or other new finding.  The patient is on 81 mg qd of ASA at home. She has no prior history of stroke. Recent interrogation of her pacemaker showed an arrhythmia that may have been PAF - she was recommended to start Eliquis previously, but patient declined.   LSN: [id number] tPA Given: Yes. [facility]   Past Medical History:  Diagnosis Date  . Arthritis   . Bradycardia   . GERD (gastroesophageal reflux disease)   . Hiatal hernia   . Hypercholesterolemia   . Hypertension   . Pacemaker     Past  Surgical History:  Procedure Laterality Date  . APPENDECTOMY    . CESAREAN SECTION    . PACEMAKER INSERTION    . TONSILLECTOMY      Family History  Problem Relation Age of Onset  . Heart failure Mother   . Diabetes Father   . Heart attack Father    Social History:  reports that she quit smoking about 37 years ago. Her smoking use included cigarettes. She has never used smokeless tobacco. She reports that she does not drink alcohol or use drugs.  Allergies:  Allergies  Allergen Reactions  . Horse-Derived Products Other (See Comments)    Reaction unknown  . Penicillins Other (See Comments)    Reaction unknown    Home Medications: Tylenol Amlodipine Enalapril ASA 81 mg [place]scal with D Fish oil Prilosec  Vitamin C  ROS: As per HPI with all other systems negative.   Physical Examination: Blood pressure 135/66, pulse 68, temperature 97.7 F (36.5 C), temperature source Oral, resp. rate 17, height 5\' 3"  (1.6 m), weight 54.4 kg, SpO2 94 %.  HEENT-  [place]/AT. Neck supple.  Cardiovascular - RRR with normal heart sounds Lungs - Respirations unlabored. No wheezing Abdomen - Normal bowel sounds. No distension Extremities - Warm and well perfused  Neurologic Examination: Mental Status: Awake and alert. Dysarthric. Oriented to year, but not city, state, day or month. Comprehension intact. Follows simple commands.  Cranial Nerves: II: Pupils reactive to 2 mm on right and 2.5 mm on left. Fixates normally. Unreliable visual  fields.  III,IV, VI:  Right gaze preference. Will track to right conjugately but does not volitionally cross midline to left. Can overcome with oculocephalic maneuver.  V,VII: Left facial droop. Unreliable sensation.  VIII: Hearing intact to voice IX,X: No hoarseness XI: Head preferentially turned to right.  XII: midline tongue extension  Motor: LUE 0/5 with increased flexor tone to elbow, wrist and digits RUE 5/5 LLE 4/5 RLE 5/5 Sensory:Temp and FT  subjectively intact x 4. Positive for extinction and mild neglect on the left.  Deep Tendon Reflexes:  2+ brachioradialis bilaterally.  0 patellae bilaterally Right toe upgoing, left toe briskly upgoing.  Cerebellar: No gross ataxia with right FNF Gait: Deferred   Results for orders placed or performed during the hospital encounter of 01/16/19 (from the past 48 hour(s))  Ethanol     Status: None   Collection Time: 01/16/19  6:20 PM  Result Value Ref Range   Alcohol, Ethyl (B) <10 <10 mg/dL    Comment: (NOTE) Lowest detectable limit for serum alcohol is 10 mg/dL. For medical purposes only. Performed at [facility], [address] [place], [place] [address]   Protime-INR     Status: None   Collection Time: 01/16/19  6:20 PM  Result Value Ref Range   Prothrombin Time 12.9 11.4 - 15.2 seconds   INR 1.0 0.8 - 1.2    Comment: (NOTE) INR goal varies based on device and disease states. Performed at [facility], [address] [place], [place] [address]   APTT     Status: None   Collection Time: 01/16/19  6:20 PM  Result Value Ref Range   aPTT 27 24 - 36 seconds    Comment: Performed at [facility], [address] [place], [place] [address]  CBC     Status: Abnormal   Collection Time: 01/16/19  6:20 PM  Result Value Ref Range   WBC 5.3 4.0 - 10.5 K/uL   RBC 3.69 (L) 3.87 - 5.11 MIL/uL   Hemoglobin 10.1 (L) 12.0 - 15.0 g/dL   HCT 63.830.9 (L) 75.636.0 - 43.346.0 %   MCV 83.7 80.0 - 100.0 fL   MCH 27.4 26.0 - 34.0 pg   MCHC 32.7 30.0 - 36.0 g/dL   RDW 29.514.9 18.811.5 - 41.615.5 %   Platelets 304 150 - 400 K/uL   nRBC 0.0 0.0 - 0.2 %    Comment: Performed at [facility], [address] [place], [place] [address]  Differential     Status: None   Collection Time: 01/16/19  6:20 PM  Result Value Ref Range   Neutrophils Relative % 42 %   Neutro Abs 2.2 1.7 - 7.7 K/uL   Lymphocytes Relative 38 %   Lymphs Abs 2.0 0.7 - 4.0 K/uL   Monocytes Relative 15 %   Monocytes Absolute 0.8 0.1 - 1.0 K/uL    Eosinophils Relative 4 %   Eosinophils Absolute 0.2 0.0 - 0.5 K/uL   Basophils Relative 1 %   Basophils Absolute 0.1 0.0 - 0.1 K/uL   Immature Granulocytes 0 %   Abs Immature Granulocytes 0.02 0.00 - 0.07 K/uL    Comment: Performed at [facility], [address] [place], [place] [address]  Comprehensive metabolic panel     Status: Abnormal   Collection Time: 01/16/19  6:20 PM  Result Value Ref Range   Sodium 130 (L) 135 - 145 mmol/L   Potassium 3.9 3.5 - 5.1 mmol/L   Chloride 99 98 - 111 mmol/L  CO2 22 22 - 32 mmol/L   Glucose, Bld 181 (H) 70 - 99 mg/dL   BUN 24 (H) 8 - 23 mg/dL   Creatinine, Ser 1.01 (H) 0.44 - 1.00 mg/dL   Calcium 8.9 8.9 - 10.3 mg/dL   Total Protein 7.4 6.5 - 8.1 g/dL   Albumin 3.8 3.5 - 5.0 g/dL   AST 19 15 - 41 U/L   ALT 15 0 - 44 U/L   Alkaline Phosphatase 81 38 - 126 U/L   Total Bilirubin 0.4 0.3 - 1.2 mg/dL   GFR calc non Af Amer 47 (L) >60 mL/min   GFR calc Af Amer 54 (L) >60 mL/min   Anion gap 9 5 - 15    Comment: Performed at [facility], [address], [place], [place] [address]   Ct Head Code Stroke Wo Contrast  Result Date: 01/16/2019 CLINICAL DATA:  Code stroke.  Left arm weakness. EXAM: CT HEAD WITHOUT CONTRAST TECHNIQUE: Contiguous axial images were obtained from the base of the skull through the vertex without intravenous contrast. COMPARISON:  CT head 04/28/2016 FINDINGS: Brain: Generalized atrophy. Moderate to extensive white matter disease bilaterally is chronic and unchanged. Negative for acute infarct. Negative for hemorrhage mass or edema. No midline shift. Vascular: Negative for hyperdense vessel. Skull: Negative Sinuses/Orbits: Paranasal sinuses clear.  Bilateral cataract surgery Other: None ASPECTS ([place] Stroke Program Early CT Score) - Ganglionic level infarction (caudate, lentiform nuclei, internal capsule, insula, M1-M3 cortex): 7 - Supraganglionic infarction (M4-M6 cortex): 3 Total score (0-10 with 10 being normal): 10 IMPRESSION:  1. No acute abnormality 2. Atrophy and extensive chronic microvascular ischemic change 3. ASPECTS is 10 4. These results were called by telephone at the time of interpretation on 01/16/2019 at 6:49 pm to Dr. Veryl Speak , who verbally acknowledged these results. Electronically Signed   By: Franchot Gallo M.D.   On: 01/16/2019 18:50    Assessment: 85 y.o. female presenting to OSH with acute onset of left sided weakness 1. NIHSS of 4 at OSH. IV tPA administered. No LVO or perfusion deficit on CTA/CTP. Transferred to [facility] for post-tPA care 2. Exam reveals plegic LUE, paretic LLE, left facial droop, right gaze preference and left hemineglect 3. Has pacemaker for SSS 4. Stroke Risk Factors - HTN and hypercholesterolemia  Plan: 1. Admitting to Neuro ICU.  2. Post-tPA order set to include frequent neuro checks and BP management.  3. No antiplatelet medications or anticoagulants for at least 24 hours following tPA.  4. DVT prophylaxis with SCDs.  5. Will need escalation of antiplatelet therapy if follow up CT at 24 hours is negative for hemorrhagic conversion. 6. Carotid ultrasound to further evaluate 40% diameter stenosis proximal left internal carotid artery.  7. TTE.  9. Has pacemaker. Most likely will be unable to perform MRI.  10. PT/OT/Speech.  11. NPO until passes swallow evaluation.  12. Telemetry monitoring 13. Fasting lipid panel, HgbA1c 14. Continue home amlodipine and enalapril  50 minutes spent in the emergent neurological evaluation and management of this critically ill acute stroke patient.   Electronically signed: Dr. Kerney Elbe 01/16/2019, 8:29 PM

## 2019-01-16 NOTE — ED Notes (Signed)
Pt to CT

## 2019-01-16 NOTE — Progress Notes (Signed)
Patient just arrived to [facility] from [facility] via [employer]. Dr. Cheral Marker notified of patient's arrival. No belongings arrived with patient (verified with [facility] RN).

## 2019-01-16 NOTE — ED Notes (Signed)
RN to room for neuro check. Pt now has left sided facial droop, confusion, and increased left sided weakness. Pt trying to get out of bed. Unable to redirect . EDP to room and aware of the same.

## 2019-01-16 NOTE — Progress Notes (Signed)
CODE STROKE 1823 beeper time 1827 exam started 1832 exam finished, images to [facility], [place] exam completed in [place], [facility] called, spoke with Carloyn Manner

## 2019-01-17 ENCOUNTER — Inpatient Hospital Stay (HOSPITAL_COMMUNITY): Payer: Medicare Other

## 2019-01-17 ENCOUNTER — Encounter: Payer: Self-pay | Admitting: Family Medicine

## 2019-01-17 ENCOUNTER — Encounter: Payer: Self-pay | Admitting: *Deleted

## 2019-01-17 DIAGNOSIS — Z950 Presence of cardiac pacemaker: Secondary | ICD-10-CM

## 2019-01-17 DIAGNOSIS — I340 Nonrheumatic mitral (valve) insufficiency: Secondary | ICD-10-CM

## 2019-01-17 DIAGNOSIS — I361 Nonrheumatic tricuspid (valve) insufficiency: Secondary | ICD-10-CM

## 2019-01-17 DIAGNOSIS — E785 Hyperlipidemia, unspecified: Secondary | ICD-10-CM

## 2019-01-17 DIAGNOSIS — I10 Essential (primary) hypertension: Secondary | ICD-10-CM

## 2019-01-17 DIAGNOSIS — I63411 Cerebral infarction due to embolism of right middle cerebral artery: Principal | ICD-10-CM

## 2019-01-17 DIAGNOSIS — I480 Paroxysmal atrial fibrillation: Secondary | ICD-10-CM

## 2019-01-17 LAB — GLUCOSE, CAPILLARY
Glucose-Capillary: 112 mg/dL — ABNORMAL HIGH (ref 70–99)
Glucose-Capillary: 115 mg/dL — ABNORMAL HIGH (ref 70–99)
Glucose-Capillary: 117 mg/dL — ABNORMAL HIGH (ref 70–99)
Glucose-Capillary: 117 mg/dL — ABNORMAL HIGH (ref 70–99)

## 2019-01-17 LAB — HEMOGLOBIN A1C
Hgb A1c MFr Bld: 6.3 % — ABNORMAL HIGH (ref 4.8–5.6)
Mean Plasma Glucose: 134.11 mg/dL

## 2019-01-17 LAB — ECHOCARDIOGRAM COMPLETE
Height: 63 in
Weight: 1920 oz

## 2019-01-17 LAB — CBG MONITORING, ED: Glucose-Capillary: 185 mg/dL — ABNORMAL HIGH (ref 70–99)

## 2019-01-17 LAB — LIPID PANEL
Cholesterol: 155 mg/dL (ref 0–200)
HDL: 65 mg/dL (ref >40)
LDL Cholesterol: 86 mg/dL (ref 0–99)
Total CHOL/HDL Ratio: 2.4 RATIO
Triglycerides: 21 mg/dL (ref <150)
VLDL: 4 mg/dL (ref 0–40)

## 2019-01-17 LAB — MRSA PCR SCREENING: MRSA by PCR: NEGATIVE

## 2019-01-17 MED ORDER — ENOXAPARIN SODIUM 30 MG/0.3ML ~~LOC~~ SOLN
30.0000 mg | SUBCUTANEOUS | Status: DC
  Administered 2019-01-17: 30 mg via SUBCUTANEOUS
  Filled 2019-01-17: qty 0.3

## 2019-01-17 MED ORDER — INSULIN ASPART 100 UNIT/ML ~~LOC~~ SOLN
0.0000 [IU] | Freq: Three times a day (TID) | SUBCUTANEOUS | Status: DC
  Administered 2019-01-19 – 2019-01-20 (×2): 2 [IU] via SUBCUTANEOUS
  Administered 2019-01-20 – 2019-01-21 (×2): 1 [IU] via SUBCUTANEOUS
  Administered 2019-01-21: 2 [IU] via SUBCUTANEOUS

## 2019-01-17 MED ORDER — INSULIN ASPART 100 UNIT/ML ~~LOC~~ SOLN: 0.0000 [IU] | SUBCUTANEOUS | Status: DC

## 2019-01-17 MED ORDER — ASPIRIN EC 325 MG PO TBEC
325.0000 mg | DELAYED_RELEASE_TABLET | Freq: Every day | ORAL | Status: DC
  Administered 2019-01-17 – 2019-01-18 (×2): 325 mg via ORAL
  Filled 2019-01-17 (×2): qty 1

## 2019-01-17 MED ORDER — PANTOPRAZOLE SODIUM 40 MG PO TBEC
40.0000 mg | DELAYED_RELEASE_TABLET | Freq: Every day | ORAL | Status: DC
  Administered 2019-01-17 – 2019-01-21 (×5): 40 mg via ORAL
  Filled 2019-01-17 (×5): qty 1

## 2019-01-17 MED ORDER — PRAVASTATIN SODIUM 10 MG PO TABS
20.0000 mg | ORAL_TABLET | Freq: Every day | ORAL | Status: DC
  Administered 2019-01-17 – 2019-01-20 (×4): 20 mg via ORAL
  Filled 2019-01-17 (×4): qty 2

## 2019-01-17 NOTE — Progress Notes (Signed)
  Echocardiogram 2D Echocardiogram has been performed.  Jennette Dubin 01/17/2019, 8:51 AM

## 2019-01-17 NOTE — Progress Notes (Signed)
STROKE TEAM PROGRESS NOTE   INTERVAL HISTORY RN at bedside. Pt has right gaze preference not able to cross midline with left side weakness. Had CT repeat overnight x 2 no bleeding. 24h tPA CT pending tonight.   Vitals:   01/17/19 0500 01/17/19 0600 01/17/19 0700 01/17/19 0800  BP: (!) 147/88 (!) 141/63 (!) 148/66 132/61  Pulse:  61 69 62  Resp: (!) 23 13 12 11   Temp:    98.5 F (36.9 C)  TempSrc:    Axillary  SpO2:  96% 97% 97%  Weight:      Height:        CBC:  Recent Labs  Lab 01/16/19 1820  WBC 5.3  NEUTROABS 2.2  HGB 10.1*  HCT 30.9*  MCV 83.7  PLT 304    Basic Metabolic Panel:  Recent Labs  Lab 01/16/19 1820  NA 130*  K 3.9  CL 99  CO2 22  GLUCOSE 181*  BUN 24*  CREATININE 1.01*  CALCIUM 8.9   Lipid Panel:     Component Value Date/Time   CHOL 155 01/17/2019 0539   CHOL 164 01/08/2019 1415   TRIG 21 01/17/2019 0539   HDL 65 01/17/2019 0539   HDL 63 01/08/2019 1415   CHOLHDL 2.4 01/17/2019 0539   VLDL 4 01/17/2019 0539   LDLCALC 86 01/17/2019 0539   LDLCALC 86 01/08/2019 1415   HgbA1c:  Lab Results  Component Value Date   HGBA1C 6.3 (H) 01/17/2019   Urine Drug Screen:     Component Value Date/Time   LABOPIA NONE DETECTED 01/16/2019 2125   COCAINSCRNUR NONE DETECTED 01/16/2019 2125   LABBENZ NONE DETECTED 01/16/2019 2125   AMPHETMU NONE DETECTED 01/16/2019 2125   THCU NONE DETECTED 01/16/2019 2125   LABBARB NONE DETECTED 01/16/2019 2125    Alcohol Level     Component Value Date/Time   ETH <10 01/16/2019 1820    IMAGING Ct Code Stroke Cta Head W/wo Contrast  Result Date: 01/16/2019 CLINICAL DATA:  Stroke.  Left-sided weakness. EXAM: CT ANGIOGRAPHY HEAD AND NECK CT PERFUSION BRAIN TECHNIQUE: Multidetector CT imaging of the head and neck was performed using the standard protocol during bolus administration of intravenous contrast. Multiplanar CT image reconstructions and MIPs were obtained to evaluate the vascular anatomy. Carotid  stenosis measurements (when applicable) are obtained utilizing NASCET criteria, using the distal internal carotid diameter as the denominator. Multiphase CT imaging of the brain was performed following IV bolus contrast injection. Subsequent parametric perfusion maps were calculated using RAPID software. CONTRAST:  115mL OMNIPAQUE IOHEXOL 350 MG/ML SOLN COMPARISON:  CT head 01/16/2019 FINDINGS: CTA NECK FINDINGS Aortic arch: Atherosclerotic disease in the aortic arch without aneurysm or dissection. 50% diameter stenosis of the origin of the innominate due to calcific plaque. Origins of the left carotid and left subclavian widely patent. Right carotid system: Widely patent without stenosis or atherosclerotic disease Left carotid system: Calcified and noncalcified plaque at the left carotid bifurcation. 40% diameter stenosis proximal left internal carotid artery due to noncalcified plaque. Vertebral arteries: Both vertebral arteries patent to the basilar without stenosis Skeleton: Cervical spondylosis.  No acute skeletal abnormality. Other neck: Rim enhancing cystic nodule right thyroid approximately 15 mm. No adenopathy in the neck. Upper chest: Apical emphysema. No upper lobe infiltrate mass or effusion Review of the MIP images confirms the above findings CTA HEAD FINDINGS Anterior circulation: atherosclerotic calcification in the cavernous carotid with mild stenosis bilaterally. Anterior and middle cerebral arteries patent without stenosis. Posterior circulation: Both vertebral  arteries patent to the basilar. PICA patent bilaterally. Basilar widely patent. Anterior inferior cerebellar artery, superior cerebellar artery, and posterior cerebral arteries patent bilaterally. Fetal origin left posterior cerebral artery. Venous sinuses: Normal enhancement Anatomic variants: None Review of the MIP images confirms the above findings CT Brain Perfusion Findings: ASPECTS: 0 CBF (<30%) Volume: 0mL Perfusion (Tmax>6.0s)  volume: 0mL Mismatch Volume: 0mL Infarction Location:None IMPRESSION: 1. CT perfusion negative for acute ischemia or infarction 2. Negative for emergent large vessel occlusion 3. 40% diameter stenosis proximal left internal carotid artery. Right carotid both vertebral arteries widely patent. Mild atherosclerotic stenosis in the cavernous carotid bilaterally. 50% diameter stenosis origin of innominate artery. Electronically Signed   By: Marlan Palau M.D.   On: 01/16/2019 20:42   Ct Head Wo Contrast  Result Date: 01/17/2019 CLINICAL DATA:  Initial evaluation for acute confusion. EXAM: CT HEAD WITHOUT CONTRAST TECHNIQUE: Contiguous axial images were obtained from the base of the skull through the vertex without intravenous contrast. COMPARISON:  Prior CT from 01/16/2019. FINDINGS: Brain: Atrophy with advanced chronic microvascular ischemic disease, stable. No acute intracranial hemorrhage. No visible acute large vessel territory infarct. No mass lesion, midline shift or mass effect. No hydrocephalus. No extra-axial fluid collection. Vascular: No asymmetric hyperdense vessel. Calcified atherosclerosis present at skull base. Skull: Scalp soft tissues and calvarium within normal limits. Sinuses/Orbits: Globes and orbital soft tissues demonstrate no acute finding. Right gaze noted. Paranasal sinuses are clear. No mastoid effusion. Other: None. IMPRESSION: 1. Stable exam.  No acute intracranial abnormality identified. 2. Atrophy with advanced chronic microvascular ischemic disease. Electronically Signed   By: Rise Mu M.D.   On: 01/17/2019 02:57   Ct Head Wo Contrast  Result Date: 01/16/2019 CLINICAL DATA:  Left facial droop, confusion, altered level of consciousness. EXAM: CT HEAD WITHOUT CONTRAST TECHNIQUE: Contiguous axial images were obtained from the base of the skull through the vertex without intravenous contrast. COMPARISON:  CTA head performed earlier today. FINDINGS: Brain: There is atrophy  and chronic small vessel disease changes. No acute intracranial abnormality. Specifically, no hemorrhage, hydrocephalus, mass lesion, acute infarction, or significant intracranial injury. Vascular: Difficult to assess for hyperdense vessel with intravascular contrast present. Skull: No acute calvarial abnormality. Sinuses/Orbits: Visualized paranasal sinuses and mastoids clear. Orbital soft tissues unremarkable. Other: None IMPRESSION: Atrophy, chronic microvascular disease. No acute intracranial abnormality. Electronically Signed   By: Charlett Nose M.D.   On: 01/16/2019 22:11   Ct Code Stroke Cta Neck W/wo Contrast  Result Date: 01/16/2019 CLINICAL DATA:  Stroke.  Left-sided weakness. EXAM: CT ANGIOGRAPHY HEAD AND NECK CT PERFUSION BRAIN TECHNIQUE: Multidetector CT imaging of the head and neck was performed using the standard protocol during bolus administration of intravenous contrast. Multiplanar CT image reconstructions and MIPs were obtained to evaluate the vascular anatomy. Carotid stenosis measurements (when applicable) are obtained utilizing NASCET criteria, using the distal internal carotid diameter as the denominator. Multiphase CT imaging of the brain was performed following IV bolus contrast injection. Subsequent parametric perfusion maps were calculated using RAPID software. CONTRAST:  OMNIPAQUE IOHEXOL 350 MG/ML SOLN COMPARISON:  CT head 01/16/2019 FINDINGS: CTA NECK FINDINGS Aortic arch: Atherosclerotic disease in the aortic arch without aneurysm or dissection. 50% diameter stenosis of the origin of the innominate due to calcific plaque. Origins of the left carotid and left subclavian widely patent. Right carotid system: Widely patent without stenosis or atherosclerotic disease Left carotid system: Calcified and noncalcified plaque at the left carotid bifurcation. 40% diameter stenosis proximal left internal carotid  artery due to noncalcified plaque. Vertebral arteries: Both vertebral  arteries patent to the basilar without stenosis Skeleton: Cervical spondylosis.  No acute skeletal abnormality. Other neck: Rim enhancing cystic nodule right thyroid approximately 15 mm. No adenopathy in the neck. Upper chest: Apical emphysema. No upper lobe infiltrate mass or effusion Review of the MIP images confirms the above findings CTA HEAD FINDINGS Anterior circulation: atherosclerotic calcification in the cavernous carotid with mild stenosis bilaterally. Anterior and middle cerebral arteries patent without stenosis. Posterior circulation: Both vertebral arteries patent to the basilar. PICA patent bilaterally. Basilar widely patent. Anterior inferior cerebellar artery, superior cerebellar artery, and posterior cerebral arteries patent bilaterally. Fetal origin left posterior cerebral artery. Venous sinuses: Normal enhancement Anatomic variants: None Review of the MIP images confirms the above findings CT Brain Perfusion Findings: ASPECTS: 0 CBF (<30%) Volume: 0mL Perfusion (Tmax>6.0s) volume: 0mL Mismatch Volume: 0mL Infarction Location:None IMPRESSION: 1. CT perfusion negative for acute ischemia or infarction 2. Negative for emergent large vessel occlusion 3. 40% diameter stenosis proximal left internal carotid artery. Right carotid both vertebral arteries widely patent. Mild atherosclerotic stenosis in the cavernous carotid bilaterally. 50% diameter stenosis origin of innominate artery. Electronically Signed   By: Marlan Palauharles  Clark M.D.   On: 01/16/2019 20:42   Ct Code Stroke Cta Cerebral Perfusion W/wo Contrast  Result Date: 01/16/2019 CLINICAL DATA:  Stroke.  Left-sided weakness. EXAM: CT ANGIOGRAPHY HEAD AND NECK CT PERFUSION BRAIN TECHNIQUE: Multidetector CT imaging of the head and neck was performed using the standard protocol during bolus administration of intravenous contrast. Multiplanar CT image reconstructions and MIPs were obtained to evaluate the vascular anatomy. Carotid stenosis measurements  (when applicable) are obtained utilizing NASCET criteria, using the distal internal carotid diameter as the denominator. Multiphase CT imaging of the brain was performed following IV bolus contrast injection. Subsequent parametric perfusion maps were calculated using RAPID software. CONTRAST:  115mL OMNIPAQUE IOHEXOL 350 MG/ML SOLN COMPARISON:  CT head 01/16/2019 FINDINGS: CTA NECK FINDINGS Aortic arch: Atherosclerotic disease in the aortic arch without aneurysm or dissection. 50% diameter stenosis of the origin of the innominate due to calcific plaque. Origins of the left carotid and left subclavian widely patent. Right carotid system: Widely patent without stenosis or atherosclerotic disease Left carotid system: Calcified and noncalcified plaque at the left carotid bifurcation. 40% diameter stenosis proximal left internal carotid artery due to noncalcified plaque. Vertebral arteries: Both vertebral arteries patent to the basilar without stenosis Skeleton: Cervical spondylosis.  No acute skeletal abnormality. Other neck: Rim enhancing cystic nodule right thyroid approximately 15 mm. No adenopathy in the neck. Upper chest: Apical emphysema. No upper lobe infiltrate mass or effusion Review of the MIP images confirms the above findings CTA HEAD FINDINGS Anterior circulation: atherosclerotic calcification in the cavernous carotid with mild stenosis bilaterally. Anterior and middle cerebral arteries patent without stenosis. Posterior circulation: Both vertebral arteries patent to the basilar. PICA patent bilaterally. Basilar widely patent. Anterior inferior cerebellar artery, superior cerebellar artery, and posterior cerebral arteries patent bilaterally. Fetal origin left posterior cerebral artery. Venous sinuses: Normal enhancement Anatomic variants: None Review of the MIP images confirms the above findings CT Brain Perfusion Findings: ASPECTS: 0 CBF (<30%) Volume: 0mL Perfusion (Tmax>6.0s) volume: 0mL Mismatch Volume:  0mL Infarction Location:None IMPRESSION: 1. CT perfusion negative for acute ischemia or infarction 2. Negative for emergent large vessel occlusion 3. 40% diameter stenosis proximal left internal carotid artery. Right carotid both vertebral arteries widely patent. Mild atherosclerotic stenosis in the cavernous carotid bilaterally. 50% diameter stenosis origin  of innominate artery. Electronically Signed   By: Marlan Palauharles  Clark M.D.   On: 01/16/2019 20:42   Ct Head Code Stroke Wo Contrast  Result Date: 01/16/2019 CLINICAL DATA:  Code stroke.  Left arm weakness. EXAM: CT HEAD WITHOUT CONTRAST TECHNIQUE: Contiguous axial images were obtained from the base of the skull through the vertex without intravenous contrast. COMPARISON:  CT head 04/28/2016 FINDINGS: Brain: Generalized atrophy. Moderate to extensive white matter disease bilaterally is chronic and unchanged. Negative for acute infarct. Negative for hemorrhage mass or edema. No midline shift. Vascular: Negative for hyperdense vessel. Skull: Negative Sinuses/Orbits: Paranasal sinuses clear.  Bilateral cataract surgery Other: None ASPECTS ([place] Stroke Program Early CT Score) - Ganglionic level infarction (caudate, lentiform nuclei, internal capsule, insula, M1-M3 cortex): 7 - Supraganglionic infarction (M4-M6 cortex): 3 Total score (0-10 with 10 being normal): 10 IMPRESSION: 1. No acute abnormality 2. Atrophy and extensive chronic microvascular ischemic change 3. ASPECTS is 10 4. These results were called by telephone at the time of interpretation on 01/16/2019 at 6:49 pm to Dr. Geoffery LyonsUGLAS DELO , who verbally acknowledged these results. Electronically Signed   By: Marlan Palauharles  Clark M.D.   On: 01/16/2019 18:50    PHYSICAL EXAM  Temp:  [97.4 F (36.3 C)-98.5 F (36.9 C)] 98.5 F (36.9 C) (07/17 0800) Pulse Rate:  [30-89] 64 (07/17 1000) Resp:  [11-29] 16 (07/17 1000) BP: (112-181)/(59-133) 163/67 (07/17 1000) SpO2:  [84 %-100 %] 96 % (07/17 1000) Weight:   [54.4 kg] 54.4 kg (07/16 1826)  General - Well nourished, well developed, in no apparent distress.  Ophthalmologic - fundi not visualized due to noncooperation.  Cardiovascular - Regular rate and rhythm.  Mental Status -  Level of arousal and orientation to time, place, and person were intact. Language including expression, naming, repetition, comprehension was assessed and found intact. However, moderate dysarthria.  Fund of Knowledge was assessed and was intact.  Cranial Nerves II - XII - II - left visual neglect. III, IV, VI - right gaze preference and not able to cross midline. V - Facial sensation intact bilaterally. VII - left facial droop. VIII - Hearing & vestibular intact bilaterally. X - Palate elevates symmetrically, moderate dysarthria. XI - Chin turning & shoulder shrug intact bilaterally. XII - Tongue protrusion intact.  Motor Strength - The patient's strength was normal in RUE and RLE extremities, however left UE 0/5 and LLE 3-/5.  Bulk was normal and fasciculations were absent.   Motor Tone - Muscle tone was assessed at the neck and appendages and was increased on the left UE.  Reflexes - The patient's reflexes were symmetrical in all extremities except left UE 3+ and she had no pathological reflexes.  Sensory - Light touch, temperature/pinprick were assessed and were symmetrical.    Coordination - The patient had normal movements in the right hand with no ataxia or dysmetria.  Tremor was absent.  Gait and Station - deferred.   ASSESSMENT/PLAN [name] is a 85 y.o. female with history of HTN, cardiac pacemaker for SSS, bradycardia, and hypercholesterolemia  [facility]ing to [facility] ED with L sided weakness. Received IV tPA 01/16/2019 at 1911.   Stroke:  R brain infarct s/p tPA, likely embolic secondary [facility]  Code Stroke CT head 7/16 1841 No acute abnormality. Small vessel disease. Atrophy. ASPECTS 10.     CTA head & neck no ELVO. L  ICA 40%, inominate 50%  CT perfusion no infarct, no penumbra  CT  head 7/16 2203 unchanged.   CT head 01/16/233 unchanged  CT head 24 h post tPA pending   Pacer interrogation showed 2hr afib on 10/21/18  2D Echo EF 55-60%  LDL 86  HgbA1c 6.3  SCDs for VTE prophylaxis  aspirin 81 mg daily prior to admission, now on No antithrombotic as within 24h tPA. Will consider antithrombotics based on CT repeat after 24h of tPA  Therapy recommendations:  pending   Disposition:  pending   PAF  Home anticoagulation:  none  Has pacer last checked 4/28 with 2h AF documented on 4/20. Will have interrogated  CHA2DS2-VASc Score = at least 7, ?2 oral anticoagulation recommended  Age in Years:  ?29   +2    Sex:  Female   Female   +1    Hypertension History:  yes   +1     Diabetes Mellitus:  yes   +1  Congestive Heart Failure History:  0  Vascular Disease History:  0     Stroke/TIA/Thromboembolism History:  yes   +2  Rate controlled  Pacer interrogation showed 2hr afib on 10/21/18  Dr. Lovena Le recommend eliquis in 10/2018 but pt declined.  . Will need to have discussion about AC with pt    Hypertension  Home meds:  norvasc 5, enalapril 5  Stable BP goal < 180/105 following tPA administration . Long-term BP goal normotensive  Hyperlipidemia  Home meds:  Fish oil  LDL 86, goal < 70  Add pravastatin 20mg   Continue statin at discharge  Other Stroke Risk Factors  Advanced age  Former Cigarette smoker, quit 37 yrsgo  Bradycardia on pacer - follows with Dr. Lovena Le  Other Active Problems  GERD on prilosec  Hyponatremia  [facility] day # 1  This patient is critically ill due to embolic stroke s/p tPA, PAF, HTN and at significant risk of neurological worsening, death form recurrent stroke, hemorrhagic conversion, heart failure, seizure, CHF. This patient's care requires constant monitoring of vital signs, hemodynamics, respiratory and cardiac monitoring, review of  multiple databases, neurological assessment, discussion with family, other specialists and medical decision making of high complexity. I spent 35 minutes of neurocritical care time in the care of this patient. I had long discussion with daughter [name] over the phone, updated pt current condition, treatment plan and potential prognosis. They expressed understanding and appreciation.   Rosalin Hawking, MD PhD Stroke Neurology 01/17/2019 4:51 PM     To contact Stroke Continuity provider, please refer to [email or web address] After hours, contact General Neurology

## 2019-01-17 NOTE — Evaluation (Signed)
Physical Therapy Evaluation Patient Details Name: Kelsey Thompson MRN: [id number] DOB: February 11, 1921 Today's Date: 01/17/2019   History of Present Illness  85 yo female admitted with L facial droop, confusion, L weakness. pt with pacemaker so no MRI. CT unrevealing at this time PMH   Clinical Impression  Patient presents with decreased balance, decreased L side awareness with LUE>LE weakness and decreased safety/deficit awareness.  Currently she needs max A to +2 for mobility, but was living independently prior to admission.  She will benefit from skilled PT in the acute setting to progress mobility and allow maximized independence with follow up SNF level rehab.     Follow Up Recommendations SNF    Equipment Recommendations  Other (comment)(TBA)    Recommendations for Other Services       Precautions / Restrictions Precautions Precautions: Fall Precaution Comments: R lateral lean      Mobility  Bed Mobility Overal bed mobility: Needs Assistance       Supine to sit: Mod assist     General bed mobility comments: lifting assist for trunk, pt already with legs off EOB over siderail; to supine +2 max A for legs and trunk and scooting up in bed  Transfers Overall transfer level: Needs assistance Equipment used: 2 person hand held assist Transfers: Sit to/from Stand Sit to Stand: +2 physical assistance;Max assist         General transfer comment: pt initiate task but unable to sustain static standing. pt with L LE flexion of hip knee in standing  Ambulation/Gait                Stairs            Wheelchair Mobility    Modified Rankin (Stroke Patients Only) Modified Rankin (Stroke Patients Only) Pre-Morbid Rankin Score: No symptoms Modified Rankin: Severe disability     Balance Overall balance assessment: Needs assistance Sitting-balance support: Feet supported Sitting balance-Leahy Scale: Poor Sitting balance - Comments: mod A for balance at timesdue  to R lateral lean, able to balance with S part of the time   Standing balance support: Bilateral upper extremity supported Standing balance-Leahy Scale: Zero Standing balance comment: UE support and max A due to L LE withdrawl                             Pertinent Vitals/Pain Pain Assessment: Faces Faces Pain Scale: Hurts a little bit Pain Location: L knee with activity Pain Descriptors / Indicators: Grimacing;Guarding Pain Intervention(s): Monitored during session;Repositioned    Home Living Family/patient expects to be discharged to:: Private residence Living Arrangements: Alone Available Help at Discharge: Family;Available PRN/intermittently Type of Home: House Home Access: Stairs to enter   Entrance Stairs-Number of Steps: 1 Home Layout: One level Home Equipment: Cane - single point;Walker - 2 wheels Additional Comments: daughter does the grocery shopping    Prior Function Level of Independence: Independent with assistive device(s)               Hand Dominance   Dominant Hand: Right    Extremity/Trunk Assessment   Upper Extremity Assessment Upper Extremity Assessment: Defer to OT evaluation    Lower Extremity Assessment Lower Extremity Assessment: LLE deficits/detail LLE Deficits / Details: moves weakly in the bed and with cues due to attention, then in standing demonstrates flexor withdrawl and c/o knee pain LLE Coordination: decreased fine motor;decreased gross motor    Cervical / Trunk Assessment Cervical / Trunk  Assessment: Kyphotic  Communication   Communication: Expressive difficulties  Cognition Arousal/Alertness: Awake/alert Behavior During Therapy: Flat affect Overall Cognitive Status: Impaired/Different from baseline Area of Impairment: Problem solving;Safety/judgement;Following commands                       Following Commands: Follows one step commands with increased time Safety/Judgement: Decreased awareness of  deficits   Problem Solving: Slow processing General Comments: pt unaware of deficits and attempting to exit the bed on arrival without (A)       General Comments General comments (skin integrity, edema, etc.): with activity BP 172/101 once supine 141/62    Exercises     Assessment/Plan    PT Assessment Patient needs continued PT services  PT Problem List Decreased strength;Decreased activity tolerance;Decreased mobility;Decreased cognition;Decreased safety awareness;Impaired tone;Decreased coordination;Decreased balance;Decreased knowledge of use of DME       PT Treatment Interventions DME instruction;Therapeutic activities;Balance training;Wheelchair mobility training;Patient/family education;Therapeutic exercise;Functional mobility training;Gait training    PT Goals (Current goals can be found in the Care Plan section)  Acute Rehab PT Goals Patient Stated Goal: to return home PT Goal Formulation: Patient unable to participate in goal setting Time For Goal Achievement: 01/31/19 Potential to Achieve Goals: Fair    Frequency Min 3X/week   Barriers to discharge        Co-evaluation PT/OT/SLP Co-Evaluation/Treatment: Yes Reason for Co-Treatment: Complexity of the patient's impairments (multi-system involvement);For patient/therapist safety;Necessary to address cognition/behavior during functional activity;To address functional/ADL transfers PT goals addressed during session: Mobility/safety with mobility;Balance         AM-PAC PT "6 Clicks" Mobility  Outcome Measure Help needed turning from your back to your side while in a flat bed without using bedrails?: A Lot Help needed moving from lying on your back to sitting on the side of a flat bed without using bedrails?: A Lot Help needed moving to and from a bed to a chair (including a wheelchair)?: Total Help needed standing up from a chair using your arms (e.g., wheelchair or bedside chair)?: Total Help needed to walk in  hospital room?: Total Help needed climbing 3-5 steps with a railing? : Total 6 Click Score: 8    End of Session Equipment Utilized During Treatment: Gait belt Activity Tolerance: Patient limited by pain;Patient limited by fatigue Patient left: in bed;with call bell/phone within reach;with bed alarm set Nurse Communication: Mobility status PT Visit Diagnosis: Hemiplegia and hemiparesis;Muscle weakness (generalized) (M62.81);Other abnormalities of gait and mobility (R26.89) Hemiplegia - Right/Left: Left Hemiplegia - dominant/non-dominant: Non-dominant Hemiplegia - caused by: Cerebral infarction    Time: [id number] PT Time Calculation (min) (ACUTE ONLY): 35 min   Charges:   PT Evaluation $PT Eval Moderate Complexity: 1 Mod          Sheran LawlessCyndi Durell Lofaso, [place] CarolinaPT Acute Rehabilitation Services [phone] 01/17/2019   Elray Mcgregorynthia Herson Prichard 01/17/2019, 4:36 PM

## 2019-01-17 NOTE — Progress Notes (Signed)
OT Cancellation Note  Patient Details Name: Kelsey Thompson MRN: [id number] DOB: 12-27-1920   Cancelled Treatment:    Reason Eval/Treat Not Completed: Active bedrest order OT order received and appreciated however this conflicts with current bedrest order set. Please increase activity tolerance as appropriate and remove bedrest from orders. . Please contact OT at [phone] if bed rest order is discontinued. OT will hold evaluation at this time and will check back as time allows pending increased activity orders.   Richelle Ito, OTR/L  Acute Rehabilitation Services Pager: [phone] Office: [phone] .  01/17/2019, 8:12 AM

## 2019-01-17 NOTE — Evaluation (Signed)
Speech Language Pathology Evaluation Patient Details Name: MAYAH URQUIDI MRN: [id number] DOB: 03-15-1921 Today's Date: 01/17/2019 Time: 1330-1400 SLP Time Calculation (min) (ACUTE ONLY): 30 min  Problem List:  Patient Active Problem List   Diagnosis Date Noted  . Acute CVA (cerebrovascular accident) ([place]) 01/16/2019  . Stroke (cerebrum) ([place]) 01/16/2019  . Gonalgia 07/17/2014  . Sinoatrial node dysfunction ([place]) 09/26/2011  . CARDIAC PACEMAKER IN SITU 11/19/2008  . Essential hypertension 11/07/2008  . BRADYCARDIA 11/07/2008  . GERD 11/07/2008  . HIATAL HERNIA 11/07/2008   Past Medical History:  Past Medical History:  Diagnosis Date  . Arthritis   . Bradycardia   . GERD (gastroesophageal reflux disease)   . Hiatal hernia   . Hypercholesterolemia   . Hypertension   . Pacemaker    Past Surgical History:  Past Surgical History:  Procedure Laterality Date  . APPENDECTOMY    . CESAREAN SECTION    . PACEMAKER INSERTION    . TONSILLECTOMY     HPI:  Kelsey Thompson is an 85 y.o. female with a PMHx of HTN and cardiac pacemaker for SSS, bradycardia, HTN and hypercholesterolemia, who presented to [facility] via EMS for acute onset of left sided weakness. This occurred at 1720, while at a restaurant. At onset, she became confused and LUE was completely flaccid. She was alert and fully oriented on arrival, describing weakness of her left hand. On Teleneurology assessment the LUE had improved but there was still moderate LUE weakness and some left-sided numbness. NIHSS was 4.  CT head at the [facility] ED showed no acute abnormality. Atrophy and extensive chronic microvascular ischemic changes were noted. ASPECTS was 10.  Teleneurology consultant recommended tPA for probable acute right MCA embolic stroke, which patient consented to.  During tPA infusion a CTA of head and neck with CTP was performed, revealing no LVO or perfusion deficit. 40% diameter stenosis of the proximal left internal carotid  artery was noted, with right carotid and both vertebral arteries noted to be widely patent. Mild atherosclerotic stenosis in the cavernous carotid bilaterally and 50% diameter stenosis origin of innominate artery also seen.  At approximately 9:50 PM the patient was sent back to CT for an acute neuro change, to rule out possible hemorrhagic complication of tPA. Repeat CT was negative for hemorrhage or other new finding.  The patient is on 81 mg qd of ASA at home. She has no prior history of stroke. Recent interrogation of her pacemaker showed an arrhythmia that may have been PAF - she was recommended to start Eliquis previously, but patient declined.    Assessment / Plan / Recommendation Clinical Impression  Cognitive/linguistric evaluation was completed.  The patient was noted to have decreased attention to task with a right gaze preference.  She was unable to remain still throughout entire evaluation and required multiple redirections to task.  Portions of the Mini Mental State Exam were given (ie 28 possible points) and she achieved a score of 13/28.  Patient stated she was living alone prior to this although it is not known if she is a good historian given level of defict.  She was fully oriented to self only and partially oriented to time.  She knew the year and season but not the month, date or day of the week.  After the orientation questions she peserverated on the month December and Christmas time for many subsequent questions.  Immediate recall of three novel words was good but given a short delay she was  only able  to recall 1/3.  Use of multiple choice cue did improve recall.  She was able to name objects but struggled to repeat a phrase, follow a 3 step command and read a short phrase.  She appeared to have a right field cut that impacted her ability to read.  She was able to provide logical solutions to simple problems with 75% accuracy.  Given level of deficit she will require 24x7 supervision at  DC and would benefit from ST follow up at the next level of care.  ST will follow during acute stay to intiate cognitive therapy.      SLP Assessment  SLP Recommendation/Assessment: Patient needs continued Speech Lanaguage Pathology Services SLP Visit Diagnosis: Cognitive communication deficit (R41.841)    Follow Up Recommendations  (continued ST at next level of care)    Frequency and Duration min 2x/week  2 weeks      SLP Evaluation Cognition  Overall Cognitive Status: Impaired/Different from baseline Arousal/Alertness: Awake/alert Orientation Level: Oriented to person;Disoriented to place;Disoriented to time;Disoriented to situation Attention: Focused Focused Attention: Impaired Focused Attention Impairment: Verbal basic Memory: Impaired Memory Impairment: Decreased recall of new information Awareness: Impaired Awareness Impairment: Intellectual impairment;Emergent impairment;Anticipatory impairment Problem Solving: Impaired Problem Solving Impairment: Verbal basic Behaviors: Restless;Perseveration       Comprehension  Auditory Comprehension Overall Auditory Comprehension: Impaired Yes/No Questions: Impaired Basic Biographical Questions: 51-75% accurate Commands: Impaired One Step Basic Commands: 50-74% accurate Two Step Basic Commands: 50-74% accurate Reading Comprehension Reading Status: Impaired Sentence Level: Impaired    Expression Expression Primary Mode of Expression: Verbal Verbal Expression Overall Verbal Expression: Appears within functional limits for tasks assessed Automatic Speech: Name Repetition: Impaired Level of Impairment: Phrase level Naming: No impairment Written Expression Dominant Hand: Right Written Expression: Not tested   Oral / Motor  Oral Motor/Sensory Function Overall Oral Motor/Sensory Function: Other (comment)(unable to assess)   GO                   Dimas AguasMelissa Cyriah Childrey, MA, CCC-SLP Acute Rehab SLP [phone]  Fleet ContrasMelissa N  Braley Luckenbaugh 01/17/2019, 2:38 PM

## 2019-01-17 NOTE — Evaluation (Signed)
Occupational Therapy Evaluation Patient Details Name: Kelsey Thompson MRN: [id number] DOB: 25-Apr-1921 Today's Date: 01/17/2019    History of Present Illness 85 yo female admitted with L facial droop, confusion, L weakness. pt with pacemaker so no MRI. CT unrevealing at this time PMH   has a past medical history of Arthritis, Bradycardia, GERD (gastroesophageal reflux disease), Hiatal hernia, Hypercholesterolemia, Hypertension, and Pacemaker.    Clinical Impression   PT admitted with L facial droop and L sided weakness with pendign further workup. Pt currently with functional limitiations due to the deficits listed below (see OT problem list). Pt currently total +2 max (A) for sit <>stand and unable to sustain standing.  Pt will benefit from skilled OT to increase their independence and safety with adls and balance to allow discharge SNF. Pt was living at home alone with daughter (A) for groceries and errands.      Follow Up Recommendations  SNF    Equipment Recommendations  Wheelchair (measurements OT);Wheelchair cushion (measurements OT);Hospital bed;3 in 1 bedside commode    Recommendations for Other Services Speech consult     Precautions / Restrictions Precautions Precautions: Fall Restrictions Weight Bearing Restrictions: No      Mobility Bed Mobility Overal bed mobility: Needs Assistance Bed Mobility: Supine to Sit     Supine to sit: Max assist;+2 for physical assistance     General bed mobility comments: Pt sitting EOB on arrival with PT. pt requires max cues and max (A) for BIL LE to return to supine  Transfers Overall transfer level: Needs assistance Equipment used: 2 person hand held assist Transfers: Sit to/from Stand Sit to Stand: +2 physical assistance;Max assist         General transfer comment: pt initiate task but unable to sustain static standign. pt with L LE flexion of hip knee in standing    Balance Overall balance assessment: Mild deficits  observed, not formally tested                                         ADL either performed or assessed with clinical judgement   ADL Overall ADL's : Needs assistance/impaired                                       General ADL Comments: total (A) for all adls at this time     Vision   Vision Assessment?: Vision impaired- to be further tested in functional context Additional Comments: noted to close R eye and states "yes" having double vision when asked. pt denies any glasses      Perception     Praxis      Pertinent Vitals/Pain Pain Assessment: (P) Faces Pain Score: 6  Faces Pain Scale: (P) No hurt Pain Location: L knee Pain Descriptors / Indicators: Grimacing Pain Intervention(s): Monitored during session     Hand Dominance Right   Extremity/Trunk Assessment Upper Extremity Assessment Upper Extremity Assessment: LUE deficits/detail LUE Deficits / Details: tone present,.not following any comannds. pt holding in a flexion pattern adducted to body LUE Sensation: decreased light touch LUE Coordination: decreased fine motor;decreased gross motor   Lower Extremity Assessment Lower Extremity Assessment: LLE deficits/detail LLE Deficits / Details: holding in extension and when standing holding in flexion pattern   Cervical / Trunk Assessment Cervical / Trunk Assessment:  Kyphotic   Communication Communication Communication: Expressive difficulties   Cognition Arousal/Alertness: Awake/alert Behavior During Therapy: WFL for tasks assessed/performed Overall Cognitive Status: Impaired/Different from baseline Area of Impairment: Problem solving;Safety/judgement                         Safety/Judgement: Decreased awareness of deficits   Problem Solving: Slow processing General Comments: pt unaware of deficits and attempting to exit the bed on arrival without (A)    General Comments  BP 172/101    Exercises     Shoulder  Instructions      Home Living Family/patient expects to be discharged to:: Private residence Living Arrangements: Alone Available Help at Discharge: (P) Family;Available PRN/intermittently Type of Home: (P) House Home Access: Stairs to enter Entergy CorporationEntrance Stairs-Number of Steps: 1   Home Layout: One level     Bathroom Shower/Tub: Chief Strategy OfficerTub/shower unit   Bathroom Toilet: Standard     Home Equipment: Cane - single point;Walker - 2 wheels   Additional Comments: daughter does the grocery shopping      Prior Functioning/Environment Level of Independence: Independent with assistive device(s)                 OT Problem List: Decreased strength;Decreased activity tolerance;Impaired balance (sitting and/or standing);Impaired vision/perception;Decreased coordination;Decreased cognition;Decreased safety awareness;Decreased knowledge of use of DME or AE;Decreased knowledge of precautions;Impaired sensation;Impaired tone;Impaired UE functional use;Pain;Decreased range of motion      OT Treatment/Interventions: Self-care/ADL training;Therapeutic exercise;Neuromuscular education;Energy conservation;DME and/or AE instruction;Manual therapy;Modalities;Therapeutic activities;Cognitive remediation/compensation;Visual/perceptual remediation/compensation;Patient/family education;Balance training    OT Goals(Current goals can be found in the care plan section) Acute Rehab OT Goals Patient Stated Goal: to return home OT Goal Formulation: With patient Time For Goal Achievement: 01/31/19 Potential to Achieve Goals: Good  OT Frequency: Min 2X/week   Barriers to D/C: Other (comment)  unknown       Co-evaluation PT/OT/SLP Co-Evaluation/Treatment: Yes Reason for Co-Treatment: Complexity of the patient's impairments (multi-system involvement);Necessary to address cognition/behavior during functional activity;For patient/therapist safety;To address functional/ADL transfers   OT goals addressed during  session: ADL's and self-care;Proper use of Adaptive equipment and DME;Strengthening/ROM      AM-PAC OT "6 Clicks" Daily Activity     Outcome Measure Help from another person eating meals?: A Lot Help from another person taking care of personal grooming?: A Lot Help from another person toileting, which includes using toliet, bedpan, or urinal?: A Lot Help from another person bathing (including washing, rinsing, drying)?: A Lot Help from another person to put on and taking off regular upper body clothing?: A Lot Help from another person to put on and taking off regular lower body clothing?: Total 6 Click Score: 11   End of Session Equipment Utilized During Treatment: Gait belt Nurse Communication: Mobility status;Precautions  Activity Tolerance: Patient tolerated treatment well Patient left: in bed;with call bell/phone within reach;with bed alarm set  OT Visit Diagnosis: Unsteadiness on feet (R26.81);Muscle weakness (generalized) (M62.81)                Time: [id number] OT Time Calculation (min): 17 min Charges:  OT General Charges $OT Visit: 1 Visit OT Evaluation $OT Eval Moderate Complexity: 1 Mod   Mateo FlowBrynn Takeyah Wieman, OTR/L  Acute Rehabilitation Services Pager: [phone] Office: [phone] .   Mateo FlowBrynn Adaisha Campise 01/17/2019, 2:31 PM

## 2019-01-18 DIAGNOSIS — I6300 Cerebral infarction due to thrombosis of unspecified precerebral artery: Secondary | ICD-10-CM

## 2019-01-18 LAB — GLUCOSE, CAPILLARY
Glucose-Capillary: 87 mg/dL (ref 70–99)
Glucose-Capillary: 103 mg/dL — ABNORMAL HIGH (ref 70–99)
Glucose-Capillary: 105 mg/dL — ABNORMAL HIGH (ref 70–99)
Glucose-Capillary: 122 mg/dL — ABNORMAL HIGH (ref 70–99)

## 2019-01-18 MED ORDER — APIXABAN 2.5 MG PO TABS
2.5000 mg | ORAL_TABLET | Freq: Two times a day (BID) | ORAL | Status: DC
  Administered 2019-01-18 – 2019-01-21 (×7): 2.5 mg via ORAL
  Filled 2019-01-18 (×7): qty 1

## 2019-01-18 NOTE — Progress Notes (Signed)
ANTICOAGULATION CONSULT NOTE  Pharmacy Consult:  Eliquis Indication: atrial fibrillation  Allergies  Allergen Reactions  . Horse-Derived Products Other (See Comments)    Tetanus:Reaction unknown  . Penicillins Other (See Comments)    Did it involve swelling of the face/tongue/throat, SOB, or low BP? Yes-swelling Did it involve sudden or severe rash/hives, skin peeling, or any reaction on the inside of your mouth or nose? No Did you need to seek medical attention at a hospital or doctor's office? Yes-Dr. office When did it last happen?4 years prior If all above answers are "NO", may proceed with cephalosporin use.   . Tetanus Antitoxin     Unknown reaction    Patient Measurements: Height: 5\' 3"  (160 cm) Weight: 120 lb (54.4 kg) IBW/kg (Calculated) : 52.4  Vital Signs: Temp: 97.2 F (36.2 C) (07/18 0800) Temp Source: Axillary (07/18 0800) BP: 148/78 (07/18 1200) Pulse Rate: 83 (07/18 1200)  Labs: Recent Labs    01/16/19 1820  HGB 10.1*  HCT 30.9*  PLT 304  APTT 27  LABPROT 12.9  INR 1.0  CREATININE 1.01*    Estimated Creatinine Clearance: 26.9 mL/min (A) (by C-G formula based on SCr of 1.01 mg/dL (H)).   Assessment: 85 YOF presented with left-sided weakness and received tPA for acute CVA.  Found to have Afib and Pharmacy consulted to dose Eliquis.  Patient is > 85 year old and < 60 kg, so she qualifies for reduced dosing.  Repeat head CT on 01/17/19 negative for acute findings.  CBC stable - no bleeding reported.  Goal of Therapy:  Appropriate anticoagulation Monitor platelets by anticoagulation protocol: Yes   Plan:  D/C Lovenox Eliquis 2.5mg  PO BID Pharmacy will sign off and follow peripherally.  Thank you for the consult!  Tasmia Blumer D. Mina Marble, PharmD, BCPS, [place] 01/18/2019, 12:47 PM

## 2019-01-18 NOTE — Progress Notes (Signed)
STROKE TEAM PROGRESS NOTE   INTERVAL HISTORY RN at bedside. Pt has right gaze preference not able to cross midline with left side arm greater than leg weakness. Had . 24h tPA CT last night which showed no acute findings.  Moderate chronic small vessel disease changes were noted.  Blood pressure has been adequately controlled.  Therapists have evaluated her and recommend rehabilitation in a skilled nursing facility Vitals:   01/18/19 0400 01/18/19 0500 01/18/19 0600 01/18/19 0700  BP: (!) 148/57 (!) 159/51 121/63 139/68  Pulse: 68 64 90 65  Resp: Temp: 98.5 F (36.9 C)     TempSrc: Oral     SpO2: 96% 95% 94% 95%  Weight:      Height:        CBC:  Recent Labs  Lab 01/16/19 1820  WBC 5.3  NEUTROABS 2.2  HGB 10.1*  HCT 30.9*  MCV 83.7  PLT 304    Basic Metabolic Panel:  Recent Labs  Lab 01/16/19 1820  NA 130*  K 3.9  CL 99  CO2 22  GLUCOSE 181*  BUN 24*  CREATININE 1.01*  CALCIUM 8.9   Lipid Panel:     Component Value Date/Time   CHOL 155 01/17/2019 0539   CHOL 164 01/08/2019 1415   TRIG 21 01/17/2019 0539   HDL 65 01/17/2019 0539   HDL 63 01/08/2019 1415   CHOLHDL 2.4 01/17/2019 0539   VLDL 4 01/17/2019 0539   LDLCALC 86 01/17/2019 0539   LDLCALC 86 01/08/2019 1415   HgbA1c:  Lab Results  Component Value Date   HGBA1C 6.3 (H) 01/17/2019   Urine Drug Screen:     Component Value Date/Time   LABOPIA NONE DETECTED 01/16/2019 2125   COCAINSCRNUR NONE DETECTED 01/16/2019 2125   LABBENZ NONE DETECTED 01/16/2019 2125   AMPHETMU NONE DETECTED 01/16/2019 2125   THCU NONE DETECTED 01/16/2019 2125   LABBARB NONE DETECTED 01/16/2019 2125    Alcohol Level     Component Value Date/Time   ETH <10 01/16/2019 1820    IMAGING  Ct Code Stroke Cta Head W/wo Contrast  Result Date: 01/16/2019 CLINICAL DATA:  Stroke.  Left-sided weakness. EXAM: CT ANGIOGRAPHY HEAD AND NECK CT PERFUSION BRAIN TECHNIQUE: Multidetector CT imaging of the head and neck was  performed using the standard protocol during bolus administration of intravenous contrast. Multiplanar CT image reconstructions and MIPs were obtained to evaluate the vascular anatomy. Carotid stenosis measurements (when applicable) are obtained utilizing NASCET criteria, using the distal internal carotid diameter as the denominator. Multiphase CT imaging of the brain was performed following IV bolus contrast injection. Subsequent parametric perfusion maps were calculated using RAPID software. CONTRAST:  OMNIPAQUE IOHEXOL 350 MG/ML SOLN COMPARISON:  CT head 01/16/2019 FINDINGS: CTA NECK FINDINGS Aortic arch: Atherosclerotic disease in the aortic arch without aneurysm or dissection. 50% diameter stenosis of the origin of the innominate due to calcific plaque. Origins of the left carotid and left subclavian widely patent. Right carotid system: Widely patent without stenosis or atherosclerotic disease Left carotid system: Calcified and noncalcified plaque at the left carotid bifurcation. 40% diameter stenosis proximal left internal carotid artery due to noncalcified plaque. Vertebral arteries: Both vertebral arteries patent to the basilar without stenosis Skeleton: Cervical spondylosis.  No acute skeletal abnormality. Other neck: Rim enhancing cystic nodule right thyroid approximately 15 mm. No adenopathy in the neck. Upper chest: Apical emphysema. No upper lobe infiltrate mass or effusion Review of the MIP images confirms the  above findings CTA HEAD FINDINGS Anterior circulation: atherosclerotic calcification in the cavernous carotid with mild stenosis bilaterally. Anterior and middle cerebral arteries patent without stenosis. Posterior circulation: Both vertebral arteries patent to the basilar. PICA patent bilaterally. Basilar widely patent. Anterior inferior cerebellar artery, superior cerebellar artery, and posterior cerebral arteries patent bilaterally. Fetal origin left posterior cerebral artery. Venous  sinuses: Normal enhancement Anatomic variants: None Review of the MIP images confirms the above findings CT Brain Perfusion Findings: ASPECTS: 0 CBF (<30%) Volume: 0mL Perfusion (Tmax>6.0s) volume: 0mL Mismatch Volume: 0mL Infarction Location:None IMPRESSION: 1. CT perfusion negative for acute ischemia or infarction 2. Negative for emergent large vessel occlusion 3. 40% diameter stenosis proximal left internal carotid artery. Right carotid both vertebral arteries widely patent. Mild atherosclerotic stenosis in the cavernous carotid bilaterally. 50% diameter stenosis origin of innominate artery. Electronically Signed   By: Marlan Palau M.D.   On: 01/16/2019 20:42   Ct Head Wo Contrast  Result Date: 01/17/2019 CLINICAL DATA:  Left facial droop with confusion.  Follow-up stroke. EXAM: CT HEAD WITHOUT CONTRAST TECHNIQUE: Contiguous axial images were obtained from the base of the skull through the vertex without intravenous contrast. COMPARISON:  01/17/2019 and 01/16/2019 FINDINGS: Motion artifact is present. Brain: Ventricles, cisterns and other CSF spaces are within normal. There is mild to moderate chronic ischemic microvascular disease unchanged. No mass, mass effect, shift of midline structures or acute hemorrhage. No evidence of acute infarction. Vascular: No hyperdense vessel or unexpected calcification. Skull: Normal. Negative for fracture or focal lesion. Sinuses/Orbits: No acute finding. Other: None. IMPRESSION: No acute findings. Moderate chronic ischemic microvascular disease. Electronically Signed   By: Elberta Fortis M.D.   On: 01/17/2019 18:21   Ct Head Wo Contrast  Result Date: 01/17/2019 CLINICAL DATA:  Initial evaluation for acute confusion. EXAM: CT HEAD WITHOUT CONTRAST TECHNIQUE: Contiguous axial images were obtained from the base of the skull through the vertex without intravenous contrast. COMPARISON:  Prior CT from 01/16/2019. FINDINGS: Brain: Atrophy with advanced chronic microvascular  ischemic disease, stable. No acute intracranial hemorrhage. No visible acute large vessel territory infarct. No mass lesion, midline shift or mass effect. No hydrocephalus. No extra-axial fluid collection. Vascular: No asymmetric hyperdense vessel. Calcified atherosclerosis present at skull base. Skull: Scalp soft tissues and calvarium within normal limits. Sinuses/Orbits: Globes and orbital soft tissues demonstrate no acute finding. Right gaze noted. Paranasal sinuses are clear. No mastoid effusion. Other: None. IMPRESSION: 1. Stable exam.  No acute intracranial abnormality identified. 2. Atrophy with advanced chronic microvascular ischemic disease. Electronically Signed   By: Rise Mu M.D.   On: 01/17/2019 02:57   Ct Head Wo Contrast  Result Date: 01/16/2019 CLINICAL DATA:  Left facial droop, confusion, altered level of consciousness. EXAM: CT HEAD WITHOUT CONTRAST TECHNIQUE: Contiguous axial images were obtained from the base of the skull through the vertex without intravenous contrast. COMPARISON:  CTA head performed earlier today. FINDINGS: Brain: There is atrophy and chronic small vessel disease changes. No acute intracranial abnormality. Specifically, no hemorrhage, hydrocephalus, mass lesion, acute infarction, or significant intracranial injury. Vascular: Difficult to assess for hyperdense vessel with intravascular contrast present. Skull: No acute calvarial abnormality. Sinuses/Orbits: Visualized paranasal sinuses and mastoids clear. Orbital soft tissues unremarkable. Other: None IMPRESSION: Atrophy, chronic microvascular disease. No acute intracranial abnormality. Electronically Signed   By: Charlett Nose M.D.   On: 01/16/2019 22:11   Ct Code Stroke Cta Neck W/wo Contrast  Result Date: 01/16/2019 CLINICAL DATA:  Stroke.  Left-sided weakness. EXAM: CT  ANGIOGRAPHY HEAD AND NECK CT PERFUSION BRAIN TECHNIQUE: Multidetector CT imaging of the head and neck was performed using the standard  protocol during bolus administration of intravenous contrast. Multiplanar CT image reconstructions and MIPs were obtained to evaluate the vascular anatomy. Carotid stenosis measurements (when applicable) are obtained utilizing NASCET criteria, using the distal internal carotid diameter as the denominator. Multiphase CT imaging of the brain was performed following IV bolus contrast injection. Subsequent parametric perfusion maps were calculated using RAPID software. CONTRAST:  115mL OMNIPAQUE IOHEXOL 350 MG/ML SOLN COMPARISON:  CT head 01/16/2019 FINDINGS: CTA NECK FINDINGS Aortic arch: Atherosclerotic disease in the aortic arch without aneurysm or dissection. 50% diameter stenosis of the origin of the innominate due to calcific plaque. Origins of the left carotid and left subclavian widely patent. Right carotid system: Widely patent without stenosis or atherosclerotic disease Left carotid system: Calcified and noncalcified plaque at the left carotid bifurcation. 40% diameter stenosis proximal left internal carotid artery due to noncalcified plaque. Vertebral arteries: Both vertebral arteries patent to the basilar without stenosis Skeleton: Cervical spondylosis.  No acute skeletal abnormality. Other neck: Rim enhancing cystic nodule right thyroid approximately 15 mm. No adenopathy in the neck. Upper chest: Apical emphysema. No upper lobe infiltrate mass or effusion Review of the MIP images confirms the above findings CTA HEAD FINDINGS Anterior circulation: atherosclerotic calcification in the cavernous carotid with mild stenosis bilaterally. Anterior and middle cerebral arteries patent without stenosis. Posterior circulation: Both vertebral arteries patent to the basilar. PICA patent bilaterally. Basilar widely patent. Anterior inferior cerebellar artery, superior cerebellar artery, and posterior cerebral arteries patent bilaterally. Fetal origin left posterior cerebral artery. Venous sinuses: Normal enhancement  Anatomic variants: None Review of the MIP images confirms the above findings CT Brain Perfusion Findings: ASPECTS: 0 CBF (<30%) Volume: 0mL Perfusion (Tmax>6.0s) volume: 0mL Mismatch Volume: 0mL Infarction Location:None IMPRESSION: 1. CT perfusion negative for acute ischemia or infarction 2. Negative for emergent large vessel occlusion 3. 40% diameter stenosis proximal left internal carotid artery. Right carotid both vertebral arteries widely patent. Mild atherosclerotic stenosis in the cavernous carotid bilaterally. 50% diameter stenosis origin of innominate artery. Electronically Signed   By: Marlan Palauharles  Clark M.D.   On: 01/16/2019 20:42   Ct Code Stroke Cta Cerebral Perfusion W/wo Contrast  Result Date: 01/16/2019 CLINICAL DATA:  Stroke.  Left-sided weakness. EXAM: CT ANGIOGRAPHY HEAD AND NECK CT PERFUSION BRAIN TECHNIQUE: Multidetector CT imaging of the head and neck was performed using the standard protocol during bolus administration of intravenous contrast. Multiplanar CT image reconstructions and MIPs were obtained to evaluate the vascular anatomy. Carotid stenosis measurements (when applicable) are obtained utilizing NASCET criteria, using the distal internal carotid diameter as the denominator. Multiphase CT imaging of the brain was performed following IV bolus contrast injection. Subsequent parametric perfusion maps were calculated using RAPID software. CONTRAST:  115mL OMNIPAQUE IOHEXOL 350 MG/ML SOLN COMPARISON:  CT head 01/16/2019 FINDINGS: CTA NECK FINDINGS Aortic arch: Atherosclerotic disease in the aortic arch without aneurysm or dissection. 50% diameter stenosis of the origin of the innominate due to calcific plaque. Origins of the left carotid and left subclavian widely patent. Right carotid system: Widely patent without stenosis or atherosclerotic disease Left carotid system: Calcified and noncalcified plaque at the left carotid bifurcation. 40% diameter stenosis proximal left internal carotid  artery due to noncalcified plaque. Vertebral arteries: Both vertebral arteries patent to the basilar without stenosis Skeleton: Cervical spondylosis.  No acute skeletal abnormality. Other neck: Rim enhancing cystic nodule right thyroid approximately 15  mm. No adenopathy in the neck. Upper chest: Apical emphysema. No upper lobe infiltrate mass or effusion Review of the MIP images confirms the above findings CTA HEAD FINDINGS Anterior circulation: atherosclerotic calcification in the cavernous carotid with mild stenosis bilaterally. Anterior and middle cerebral arteries patent without stenosis. Posterior circulation: Both vertebral arteries patent to the basilar. PICA patent bilaterally. Basilar widely patent. Anterior inferior cerebellar artery, superior cerebellar artery, and posterior cerebral arteries patent bilaterally. Fetal origin left posterior cerebral artery. Venous sinuses: Normal enhancement Anatomic variants: None Review of the MIP images confirms the above findings CT Brain Perfusion Findings: ASPECTS: 0 CBF (<30%) Volume: 54mL Perfusion (Tmax>6.0s) volume: 33mL Mismatch Volume: 58mL Infarction Location:None IMPRESSION: 1. CT perfusion negative for acute ischemia or infarction 2. Negative for emergent large vessel occlusion 3. 40% diameter stenosis proximal left internal carotid artery. Right carotid both vertebral arteries widely patent. Mild atherosclerotic stenosis in the cavernous carotid bilaterally. 50% diameter stenosis origin of innominate artery. Electronically Signed   By: Franchot Gallo M.D.   On: 01/16/2019 20:42   Ct Head Code Stroke Wo Contrast  Result Date: 01/16/2019 CLINICAL DATA:  Code stroke.  Left arm weakness. EXAM: CT HEAD WITHOUT CONTRAST TECHNIQUE: Contiguous axial images were obtained from the base of the skull through the vertex without intravenous contrast. COMPARISON:  CT head 04/28/2016 FINDINGS: Brain: Generalized atrophy. Moderate to extensive white matter disease  bilaterally is chronic and unchanged. Negative for acute infarct. Negative for hemorrhage mass or edema. No midline shift. Vascular: Negative for hyperdense vessel. Skull: Negative Sinuses/Orbits: Paranasal sinuses clear.  Bilateral cataract surgery Other: None ASPECTS ([place] Stroke Program Early CT Score) - Ganglionic level infarction (caudate, lentiform nuclei, internal capsule, insula, M1-M3 cortex): 7 - Supraganglionic infarction (M4-M6 cortex): 3 Total score (0-10 with 10 being normal): 10 IMPRESSION: 1. No acute abnormality 2. Atrophy and extensive chronic microvascular ischemic change 3. ASPECTS is 10 4. These results were called by telephone at the time of interpretation on 01/16/2019 at 6:49 pm to Dr. Veryl Speak , who verbally acknowledged these results. Electronically Signed   By: Franchot Gallo M.D.   On: 01/16/2019 18:50    PHYSICAL EXAM  Temp:  [98.3 F (36.8 C)-99.7 F (37.6 C)] 98.5 F (36.9 C) (07/18 0400) Pulse Rate:  [58-90] 65 (07/18 0700) Resp:  [11-25] 13 (07/18 0700) BP: (121-163)/(51-130) 139/68 (07/18 0700) SpO2:  [93 %-99 %] 95 % (07/18 0700)  General -frail elderly Caucasian lady, in no apparent distress.  Ophthalmologic - fundi not visualized due to noncooperation.  Cardiovascular - Regular rate and rhythm.  Soft ejection murmur throughout precordium Lungs are clear to auscultation  Mental Status -  Awake alert oriented to person and place only.  Follows simple midline and one-step commands only.  Diminished attention, registration and recall.  Moderate dysarthria but can be understood.  Cranial Nerves II - XII - II - left visual neglect. III, IV, VI - right gaze preference and not able to cross midline. V - Facial sensation intact bilaterally. VII - left facial droop. VIII - Hearing & vestibular intact bilaterally. X - Palate elevates symmetrically, moderate dysarthria. XI - Chin turning & shoulder shrug intact bilaterally. XII - Tongue protrusion  intact.  Motor Strength - The patient's strength was normal in RUE and RLE extremities, however left UE 0/5 and LLE 3-/5.  Bulk was normal and fasciculations were absent.   Motor Tone - Muscle tone was assessed at the neck and appendages and was increased on the left  UE.  Reflexes - The patient's reflexes were symmetrical in all extremities except left upper extremity brisk reflexes   Sensory - Light touch, temperature/pinprick were assessed and were symmetrical.    Coordination - The patient had normal movements in the right hand with no ataxia or dysmetria.  Tremor was absent.  Gait and Station - deferred.   ASSESSMENT/PLAN Ms. Elvia CollumLillie P Gesell is a 85 y.o. female with history of HTN, cardiac pacemaker for SSS, bradycardia, and hypercholesterolemia  Presenting to [facility] ED with L sided weakness. Received IV tPA 01/16/2019 at 1911.   Stroke:  R brain infarct s/p tPA, likely embolic secondary to [facility]  Code Stroke CT head 7/16 1841 No acute abnormality. Small vessel disease. Atrophy. ASPECTS 10.     CTA head & neck no ELVO. L ICA 40%, inominate 50%  CT perfusion no infarct, no penumbra  CT head 7/16 2203 unchanged.   CT head 01/16/233 unchanged  CT head 24 h post tPA - No acute findings. Moderate chronic ischemic microvascular disease.  Pacer interrogation showed 2hr afib on 10/21/18  2D Echo EF 55-60%  LDL 86  HgbA1c 6.3  SCDs for VTE prophylaxis  aspirin 81 mg daily prior to admission, now on No antithrombotic as within 24h tPA. Will consider antithrombotics based on CT repeat after 24h of tPA  (No acute findings)  Therapy recommendations:  SNF  Disposition:  pending   PAF  Home anticoagulation:  none  Has pacer last checked 4/28 with 2h AF documented on 4/20. Will have interrogated  CHA2DS2-VASc Score = at least 7, ?2 oral anticoagulation recommended  Age in Years:  ?[age]   +2    Sex:  Female   Female   +1    Hypertension History:  yes   +1      Diabetes Mellitus:  yes   +1  Congestive Heart Failure History:  0  Vascular Disease History:  0     Stroke/TIA/Thromboembolism History:  yes   +2  Rate controlled  Pacer interrogation showed 2hr afib on 10/21/18  Dr. Ladona Ridgelaylor recommend eliquis in 10/2018 but pt declined.  . Will need to have discussion about AC with pt    Hypertension  Home meds:  norvasc 5, enalapril 5  Stable BP goal < 180/105 following tPA administration . Long-term BP goal normotensive  Hyperlipidemia  Home meds:  Fish oil  LDL 86, goal < 70  Add pravastatin 20mg   Continue statin at discharge  Other Stroke Risk Factors  Advanced age  Former Cigarette smoker, quit 37 yrsgo  Bradycardia on pacer - follows with Dr. Ladona Ridgelaylor  Other Active Problems  GERD on prilosec  Hyponatremia  130  Hospital day # 2  This patient is critically ill due to embolic stroke s/p tPA, PAF, HTN and at significant risk of neurological worsening, death form recurrent stroke, hemorrhagic conversion, heart failure, seizure, CHF. This patient's care requires constant monitoring of vital signs, hemodynamics, respiratory and cardiac monitoring, review of multiple databases, neurological assessment, discussion with family, other specialists and medical decision making of high complexity. I spent 30 minutes of neurocritical care time in the care of this patient. I had long discussion with daughter Darl PikesSusan over the phone, updated pt current condition, treatment plan and potential prognosis. They expressed understanding and appreciation.  I recommend Eliquis or aspirin given risk benefit and patient having recurrent strokes on aspirin and having A. fib.  She is in agreement with plan.  Plan mobilize  out of bed.  Therapy consults.  Repeat CT scan of the head tomorrow morning to image stroke not seen on the first 2 CT scans.  MRI cannot be obtained because of pacemaker transfer to neurology floor bed later today Delia HeadyPramod Onika Gudiel, MD Medical  Director [facility] Pager: [phone] 01/18/2019 12:17 PM     To contact Stroke Continuity provider, please refer to [email or web address]. After hours, contact General Neurology

## 2019-01-18 NOTE — Progress Notes (Signed)
Assisted tele visit to patient with daughter. ° °Milon Dethloff P, RN  °

## 2019-01-19 ENCOUNTER — Encounter (HOSPITAL_COMMUNITY): Payer: Self-pay | Admitting: *Deleted

## 2019-01-19 ENCOUNTER — Inpatient Hospital Stay (HOSPITAL_COMMUNITY): Payer: Medicare Other

## 2019-01-19 LAB — GLUCOSE, CAPILLARY
Glucose-Capillary: 99 mg/dL (ref 70–99)
Glucose-Capillary: 80 mg/dL (ref 70–99)
Glucose-Capillary: 168 mg/dL — ABNORMAL HIGH (ref 70–99)
Glucose-Capillary: 108 mg/dL — ABNORMAL HIGH (ref 70–99)

## 2019-01-19 NOTE — NC FL2 (Addendum)
[place] LEVEL OF CARE SCREENING TOOL     IDENTIFICATION  Patient Name: Kelsey Thompson Birthdate: 12-Apr-1921 Sex: female Admission Date (Current Location): 01/16/2019  [facility] and [place] Number:  Herbalist and Address:  [facility], [place] [address], [place], [place] [address]      Provider Number: [id number]  Attending Physician Name and Address:  Garvin Fila, MD  Relative Name and Phone Number:  Tedra Coupe, Daughter, [phone]    Current Level of Care: Hospital Recommended Level of Care: [place] Prior Approval Number:    Date Approved/Denied: (P) 01/19/19 PASRR Number: [id number]  Discharge Plan: SNF    Current Diagnoses: Patient Active Problem List   Diagnosis Date Noted  . Acute CVA (cerebrovascular accident) ([place]) 01/16/2019  . Stroke (cerebrum) ([place]) 01/16/2019  . Gonalgia 07/17/2014  . Sinoatrial node dysfunction ([place]) 09/26/2011  . CARDIAC PACEMAKER IN SITU 11/19/2008  . Essential hypertension 11/07/2008  . BRADYCARDIA 11/07/2008  . GERD 11/07/2008  . HIATAL HERNIA 11/07/2008    Orientation RESPIRATION BLADDER Height & Weight     Self, Situation, Place  Normal Continent, External catheter Weight: 120 lb (54.4 kg) Height:  5\' 3"  (160 cm)  BEHAVIORAL SYMPTOMS/MOOD NEUROLOGICAL BOWEL NUTRITION STATUS      Continent Diet(Dys 3, thin liquids)  AMBULATORY STATUS COMMUNICATION OF NEEDS Skin   Extensive Assist Verbally (on left and right arm)                       Personal Care Assistance Level of Assistance  Bathing, Feeding, Total care, Dressing Bathing Assistance: Limited assistance Feeding assistance: Independent Dressing Assistance: Maximum assistance Total Care Assistance: Maximum assistance   Functional Limitations Info  Sight, Hearing, Speech Sight Info: Adequate Hearing Info: Adequate Speech Info: Adequate    SPECIAL CARE FACTORS FREQUENCY  PT (By licensed PT),  OT (By licensed OT), Speech therapy     PT Frequency: 5x/wk OT Frequency: 5x/wk     Speech Therapy Frequency: 2x/wk      Contractures Contractures Info: Not present    Additional Factors Info  Code Status, Allergies, Insulin Sliding Scale Code Status Info: Full Code Allergies Info: Horse-derived Products, Penicillins, Tetanus Antitoxin   Insulin Sliding Scale Info: insulin aspart novolog 0-9 units 3x daily w/meals       Current Medications (01/19/2019):  This is the current hospital active medication list Current Facility-Administered Medications  Medication Dose Route Frequency Provider Last Rate Last Dose  . 0.9 %  sodium chloride infusion   Intravenous Continuous Rosalin Hawking, MD 50 mL/hr at 01/19/19 0500    . acetaminophen (TYLENOL) tablet 650 mg  650 mg Oral Q4H PRN Kerney Elbe, MD       Or  . acetaminophen (TYLENOL) solution 650 mg  650 mg Per Tube Q4H PRN Kerney Elbe, MD       Or  . acetaminophen (TYLENOL) suppository 650 mg  650 mg Rectal Q4H PRN Kerney Elbe, MD      . amLODipine (NORVASC) tablet 5 mg  5 mg Oral Daily Kerney Elbe, MD   5 mg at 01/19/19 0952  . apixaban (ELIQUIS) tablet 2.5 mg  2.5 mg Oral BID Dang, Thuy D, RPH   2.5 mg at 01/19/19 6063  . Chlorhexidine Gluconate Cloth 2 % PADS 6 each  6 each Topical Daily Kerney Elbe, MD   6 each at 01/19/19 [phone]  . labetalol (NORMODYNE) injection 20 mg  20  mg Intravenous Once Caryl PinaLindzen, Eric, MD       And  . clevidipine (CLEVIPREX) infusion 0.5 mg/mL  0-21 mg/hr Intravenous Continuous Caryl PinaLindzen, Eric, MD      . enalapril (VASOTEC) tablet 5 mg  5 mg Oral Daily Caryl PinaLindzen, Eric, MD   5 mg at 01/18/19 0918  . insulin aspart (novoLOG) injection 0-9 Units  0-9 Units Subcutaneous TID WC Marvel PlanXu, Jindong, MD      . pantoprazole (PROTONIX) EC tablet 40 mg  40 mg Oral Daily Marvel PlanXu, Jindong, MD   40 mg at 01/19/19 0952  . pravastatin (PRAVACHOL) tablet 20 mg  20 mg Oral q1800 Marvel PlanXu, Jindong, MD   20 mg at 01/18/19 1758  . senna-docusate  (Senokot-S) tablet 1 tablet  1 tablet Oral QHS PRN Caryl PinaLindzen, Eric, MD         Discharge Medications: Please see discharge summary for a list of discharge medications.  Relevant Imaging Results:  Relevant Lab Results:   Additional Information SSN: [id number]  Nada BoozerCaitlin B Pinion, LCSWA  I have personally obtained history,examined this patient, reviewed notes, independently viewed imaging studies, participated in medical decision making and plan of care.ROS completed by me personally and pertinent positives fully documented  I have made any additions or clarifications directly to the above note. Agree with note above.  Delia HeadyPramod Sethi, MD Medical Director [facility] Pager: [phone] 01/20/2019 1:04 PM

## 2019-01-19 NOTE — TOC Initial Note (Signed)
Transition of Care [facility]) - Initial/Assessment Note    Patient Details  Name: Kelsey Thompson MRN: [id number] Date of Birth: 1921-05-18  Transition of Care [facility]) CM/SW Contact:    Gelene Mink, [place] Phone Number: 01/19/2019, 2:47 PM  Clinical Narrative:                  CSW called and spoke with the patient's daughter, Kelsey Thompson. CSW introduced herself and explained her role. CSW shared the therapy recommendation. Kelsey Thompson stated that she agreed that her mother needs rehab before she returns home. Kelsey Thompson stated before her mother's stroke she was living semi-independently. She stated that she lives about five minutes from her mother and would stop by every day to check on her. She stated that she doesn't feel safe allowing her mother to return home without rehab. CSW offered SNF choice to [place]. Her first choice is [facility] in [place], second choices are [facility] and [facility] will call Kelsey Thompson with bed offers and continue to follow.   Expected Discharge Plan: Skilled Nursing Facility Barriers to Discharge: Insurance Authorization, Continued Medical Work up   Patient Goals and CMS Choice Patient states their goals for this hospitalization and ongoing recovery are:: Pt daughter would like her mother to receive rehab before returning home CMS Medicare.gov Compare Post Acute Care list provided to:: Patient Represenative (must comment) Choice offered to / list presented to : Adult Children  Expected Discharge Plan and Services Expected Discharge Plan: [place] In-house Referral: Clinical Social Work Discharge Planning Services: NA Post Acute Care Choice: [place] Living arrangements for the past 2 months: Single Family Home                 DME Arranged: N/A DME Agency: NA       HH Arranged: NA [place] Agency: NA        Prior Living Arrangements/Services Living arrangements for the past 2 months: [place] Lives with::  Self   Do you feel safe going back to the place where you live?: No   Pt daughter doesn't feel safe having her mom return home without rehab  Need for Family Participation in Patient Care: Yes (Comment) Care giver support system in place?: Yes (comment)   Criminal Activity/Legal Involvement Pertinent to Current Situation/Hospitalization: No - Comment as needed  Activities of Daily Living Home Assistive Devices/Equipment: Environmental consultant (specify type), Cane (specify quad or straight)(front wheels, straight cane) ADL Screening (condition at time of admission) Patient's cognitive ability adequate to safely complete daily activities?: Yes Is the patient deaf or have difficulty hearing?: No Does the patient have difficulty seeing, even when wearing glasses/contacts?: No Does the patient have difficulty concentrating, remembering, or making decisions?: No Patient able to express need for assistance with ADLs?: Yes Does the patient have difficulty dressing or bathing?: No Independently performs ADLs?: Yes (appropriate for developmental age) Does the patient have difficulty walking or climbing stairs?: Yes Weakness of Legs: Left Weakness of Arms/Hands: Left  Permission Sought/Granted Permission sought to share information with : Case Manager Permission granted to share information with : Yes, Verbal Permission Granted  Share Information with NAME: Kelsey Thompson  Permission granted to share info w AGENCY: All SNF  Permission granted to share info w Relationship: Daughter     Emotional Assessment Appearance:: Appears older than stated age Attitude/Demeanor/Rapport: Unable to Assess Affect (typically observed): Unable to Assess Orientation: : Oriented to Self, Oriented to Place, Oriented to Situation Alcohol /  Substance Use: Not Applicable Psych Involvement: No (comment)  Admission diagnosis:  Acute CVA (cerebrovascular accident) [facility](HCC) [I63.9] Patient Active Problem List   Diagnosis Date Noted  . Acute  CVA (cerebrovascular accident) (HCC) 01/16/2019  . Stroke (cerebrum) (HCC) 01/16/2019  . Gonalgia 07/17/2014  . Sinoatrial node dysfunction (HCC) 09/26/2011  . CARDIAC PACEMAKER IN SITU 11/19/2008  . Essential hypertension 11/07/2008  . BRADYCARDIA 11/07/2008  . GERD 11/07/2008  . HIATAL HERNIA 11/07/2008   PCP:  Mechele ClaudeStacks, [place], MD Pharmacy:   [id number] - [place], [place] - [address] [place] [address] Phone: [phone] Fax: [phone]  [facility] [address] - [place], [place] - [address] [place] [address] [place] [address] Phone: [phone] Fax: [phone]     Social Determinants of Health (SDOH) Interventions    Readmission Risk Interventions No flowsheet data found.

## 2019-01-19 NOTE — Plan of Care (Signed)
  Problem: Self-Care: Goal: Ability to communicate needs accurately will improve Outcome: Progressing   

## 2019-01-19 NOTE — Progress Notes (Signed)
STROKE TEAM PROGRESS NOTE   INTERVAL HISTORY No one is at bedside. Pt has right gaze preference not able to cross midline with left side arm greater than leg weakness. Had . Repeat CT this am which showed no acute findings.  Moderate chronic small vessel disease changes were noted.  Blood pressure has been adequately controlled.  Therapists have evaluated her and recommend rehabilitation in a skilled nursing facility Vitals:   01/19/19 0400 01/19/19 0407 01/19/19 0600 01/19/19 0652  BP:  (!) 158/88 (!) 145/117 (!) 150/71  Pulse: (!) 58 75 62 71  Resp: 11 13 11 16   Temp: 98.7 F (37.1 C)   98.8 F (37.1 C)  TempSrc: Oral   Oral  SpO2: 96% 96% 97% 97%  Weight:      Height:        CBC:  Recent Labs  Lab 01/16/19 1820  WBC 5.3  NEUTROABS 2.2  HGB 10.1*  HCT 30.9*  MCV 83.7  PLT 161    Basic Metabolic Panel:  Recent Labs  Lab 01/16/19 1820  NA 130*  K 3.9  CL 99  CO2 22  GLUCOSE 181*  BUN 24*  CREATININE 1.01*  CALCIUM 8.9   Lipid Panel:     Component Value Date/Time   CHOL 155 01/17/2019 0539   CHOL 164 01/08/2019 1415   TRIG 21 01/17/2019 0539   HDL 65 01/17/2019 0539   HDL 63 01/08/2019 1415   CHOLHDL 2.4 01/17/2019 0539   VLDL 4 01/17/2019 0539   LDLCALC 86 01/17/2019 0539   LDLCALC 86 01/08/2019 1415   HgbA1c:  Lab Results  Component Value Date   HGBA1C 6.3 (H) 01/17/2019   Urine Drug Screen:     Component Value Date/Time   LABOPIA NONE DETECTED 01/16/2019 2125   COCAINSCRNUR NONE DETECTED 01/16/2019 2125   LABBENZ NONE DETECTED 01/16/2019 2125   AMPHETMU NONE DETECTED 01/16/2019 2125   THCU NONE DETECTED 01/16/2019 2125   LABBARB NONE DETECTED 01/16/2019 2125    Alcohol Level     Component Value Date/Time   ETH <10 01/16/2019 1820    IMAGING  Ct Head Wo Contrast 01/19/2019 IMPRESSION:  No change in the head CT over the last few studies. Atrophy and extensive chronic small-vessel ischemic changes. Focal calcification in a right MCA  branch in the sylvian fissure presumably represents a small nonocclusive embolic lesion. This was not present in 2017. On the CT angiogram. This did not appear to cause occlusion of distal flow.   Ct Head Wo Contrast  01/17/2019 IMPRESSION:  No acute findings. Moderate chronic ischemic microvascular disease.    PHYSICAL EXAM  Temp:  [98.3 F (36.8 C)-98.8 F (37.1 C)] 98.8 F (37.1 C) (07/19 0652) Pulse Rate:  [58-80] 71 (07/19 0652) Resp:  [11-28] 16 (07/19 0652) BP: (91-172)/(56-117) 150/71 (07/19 0652) SpO2:  [92 %-97 %] 97 % (07/19 0652)  General -frail elderly Caucasian lady, in no apparent distress.  Ophthalmologic - fundi not visualized due to noncooperation.  Cardiovascular - Regular rate and rhythm.  Soft ejection murmur throughout precordium Lungs are clear to auscultation  Mental Status -  Awake alert oriented to person and place only.  Follows simple midline and one-step commands only.  Diminished attention, registration and recall.  Moderate dysarthria but can be understood.  Cranial Nerves II - XII - II - left visual neglect. III, IV, VI - right gaze preference and  able to cross midline. V - Facial sensation intact bilaterally. VII - left facial  droop. VIII - Hearing & vestibular intact bilaterally. X - Palate elevates symmetrically, moderate dysarthria. XI - Chin turning & shoulder shrug intact bilaterally. XII - Tongue protrusion intact.  Motor Strength - The patient's strength was normal in RUE and RLE extremities, however left UE 0/5 and LLE 3-/5.  Bulk was normal and fasciculations were absent.   Motor Tone - Muscle tone was assessed at the neck and appendages and was increased on the left UE.  Reflexes - The patient's reflexes were symmetrical in all extremities except left upper extremity brisk reflexes   Sensory - Light touch, temperature/pinprick were assessed and were symmetrical.    Coordination - The patient had normal movements in the right hand  with no ataxia or dysmetria.  Tremor was absent.  Gait and Station - deferred.   ASSESSMENT/PLAN [name] is a 85 y.o. female with history of HTN, cardiac pacemaker for SSS, bradycardia, and hypercholesterolemia  Presenting to [facility] ED with L sided weakness. Received IV tPA 01/16/2019 at 1911.   Stroke:  R brain infarct s/p tPA, likely embolic secondary to [facility]  Code Stroke CT head 7/16 1841 No acute abnormality. Small vessel disease. Atrophy. ASPECTS 10.     CTA head & neck no ELVO. L ICA 40%, inominate 50%  CT perfusion no infarct, no penumbra  CT head 7/16 2203 unchanged.   CT head 01/16/233 unchanged  CT head 24 h post tPA - No acute findings. Moderate chronic ischemic microvascular disease.  CT Head 01/19/19 - No change in the head CT over the last few studies  Pacer interrogation showed 2hr afib on 10/21/18  2D Echo EF 55-60%  LDL 86  HgbA1c 6.3  SCDs for VTE prophylaxis  aspirin 81 mg daily prior to admission, now on No antithrombotic as within 24h tPA. Will consider antithrombotics based on CT repeat after 24h of tPA  (No acute findings)  Therapy recommendations:  SNF  Disposition:  pending   PAF  Home anticoagulation:  none  Has pacer last checked 4/28 with 2h AF documented on 4/20. Will have interrogated  CHA2DS2-VASc Score = at least 7, ?2 oral anticoagulation recommended  Age in Years:  ?[age]   +2    Sex:  Female   Female   +1    Hypertension History:  yes   +1     Diabetes Mellitus:  yes   +1  Congestive Heart Failure History:  0  Vascular Disease History:  0     Stroke/TIA/Thromboembolism History:  yes   +2  Rate controlled  Pacer interrogation showed 2hr afib on 10/21/18  Dr. Ladona Ridgelaylor recommend eliquis in 10/2018 but pt declined.  . Will need to have discussion about AC with pt  . Eliquis started Saturday 01/18/19 (after Dr Marlis EdelsonSethi's discussion with pt's daughter)   Hypertension  Home meds:  norvasc 5, enalapril  5  Stable BP goal < 180/105 following tPA administration . Long-term BP goal normotensive  Hyperlipidemia  Home meds:  Fish oil  LDL 86, goal < 70  Add pravastatin 20mg   Continue statin at discharge  Other Stroke Risk Factors  Advanced age  Former Cigarette smoker, quit 37 yrsgo  Bradycardia on pacer - follows with Dr. Ladona Ridgelaylor  Other Active Problems  GERD on prilosec  Hyponatremia  130  PLAN   Check labs in AM  Hospital day # 3  Continue Eliquis  given risk benefit and patient having recurrent strokes on aspirin and having  A. fib. Family is in agreement with plan.  Continue ongoing therapies and transfer to SNF for rehab in next few days when bed available.  Delia HeadyPramod Ebunoluwa Gernert, MD Medical Director [facility] Pager: [phone] 01/19/2019 1:10 PM   To contact Stroke Continuity provider, please refer to [email or web address]. After hours, contact General Neurology

## 2019-01-20 LAB — GLUCOSE, CAPILLARY
Glucose-Capillary: 97 mg/dL (ref 70–99)
Glucose-Capillary: 151 mg/dL — ABNORMAL HIGH (ref 70–99)
Glucose-Capillary: 128 mg/dL — ABNORMAL HIGH (ref 70–99)
Glucose-Capillary: 127 mg/dL — ABNORMAL HIGH (ref 70–99)

## 2019-01-20 NOTE — Care Management Important Message (Signed)
Important Message  Patient Details  Name: Kelsey Thompson MRN: [id number] Date of Birth: [birth year]   Medicare Important Message Given:  Yes     [name] 01/20/2019, 2:05 PM

## 2019-01-20 NOTE — Progress Notes (Signed)
Physical Therapy Treatment Patient Details Name: Kelsey CollumLillie P Neubecker MRN: [id number] DOB: 1921-04-20 Today's Date: 01/20/2019    History of Present Illness 85 yo female admitted with L facial droop, confusion, L weakness. pt with pacemaker so no MRI. CT unrevealing at this time PMH     PT Comments    Patient able to progress OOB to recliner chair with stedy. When attempting to stand from recliner w/o stedy, pt unable to come fully upright. L UE mostly flaccid throughout session. Patient would benefit from continued skilled PT to maximize functional independence and safety with mobility. Will continue to follow acutely.   Follow Up Recommendations  SNF     Equipment Recommendations  Other (comment)(TBA)    Recommendations for Other Services       Precautions / Restrictions Precautions Precautions: Fall Precaution Comments: L lateral lean Restrictions Weight Bearing Restrictions: No    Mobility  Bed Mobility Overal bed mobility: Needs Assistance Bed Mobility: Supine to Sit     Supine to sit: Mod assist     General bed mobility comments: VC for technique and sequencing. Mod A to progress LE off EOB and to lift trunk.   Transfers Overall transfer level: Needs assistance Equipment used: 1 person hand held assist Transfers: Sit to/from Stand Sit to Stand: +2 physical assistance;Max assist;Min assist         General transfer comment: First sit<>stand perfromed from EOB with +2 min A into stedy. Pt with forward lean and having trouble coming fully upright. After moving to recliner via stedy attempted sit<>stand with face to face and knees braced. Pt required max A and was unable to come fully upright from recliner.   Ambulation/Gait                 Stairs             Wheelchair Mobility    Modified Rankin (Stroke Patients Only) Modified Rankin (Stroke Patients Only) Pre-Morbid Rankin Score: No symptoms Modified Rankin: Severe disability     Balance  Overall balance assessment: Needs assistance Sitting-balance support: Feet supported Sitting balance-Leahy Scale: Poor Sitting balance - Comments: Min A for balance at EOB dur to L Lateral lean   Standing balance support: Bilateral upper extremity supported Standing balance-Leahy Scale: Zero Standing balance comment: Requires min A to stand in stedy, unable to stand without stedy.                            Cognition Arousal/Alertness: Awake/alert Behavior During Therapy: Flat affect;WFL for tasks assessed/performed Overall Cognitive Status: Within Functional Limits for tasks assessed                                 General Comments: cognition seems improved from previous session. Pt alert and orientated.       Exercises      General Comments General comments (skin integrity, edema, etc.): very plesent and willing to work with therapy      Pertinent Vitals/Pain Pain Assessment: Faces Faces Pain Scale: Hurts a little bit Pain Location: L knee with activity Pain Descriptors / Indicators: Grimacing;Guarding    Home Living                      Prior Function            PT Goals (current goals can now be found in  the care plan section) Acute Rehab PT Goals Patient Stated Goal: to return home PT Goal Formulation: Patient unable to participate in goal setting Time For Goal Achievement: 01/31/19 Potential to Achieve Goals: Fair Progress towards PT goals: Progressing toward goals    Frequency    Min 3X/week      PT Plan Current plan remains appropriate    Co-evaluation              AM-PAC PT "6 Clicks" Mobility   Outcome Measure  Help needed turning from your back to your side while in a flat bed without using bedrails?: A Lot Help needed moving from lying on your back to sitting on the side of a flat bed without using bedrails?: A Lot Help needed moving to and from a bed to a chair (including a wheelchair)?: Total Help  needed standing up from a chair using your arms (e.g., wheelchair or bedside chair)?: Total Help needed to walk in hospital room?: Total Help needed climbing 3-5 steps with a railing? : Total 6 Click Score: 8    End of Session Equipment Utilized During Treatment: Gait belt Activity Tolerance: Patient tolerated treatment well Patient left: with call bell/phone within reach;in chair;with chair alarm set Nurse Communication: Mobility status PT Visit Diagnosis: Hemiplegia and hemiparesis;Muscle weakness (generalized) (M62.81);Other abnormalities of gait and mobility (R26.89) Hemiplegia - Right/Left: Left Hemiplegia - dominant/non-dominant: Non-dominant Hemiplegia - caused by: Cerebral infarction     Time: 1962-2297 PT Time Calculation (min) (ACUTE ONLY): 18 min  Charges:  $Neuromuscular Re-education: 8-22 mins                     Benjiman Core, [place] Pager [phone] Acute Rehab   Allena Katz 01/20/2019, 3:30 PM

## 2019-01-20 NOTE — Progress Notes (Signed)
STROKE TEAM PROGRESS NOTE   INTERVAL HISTORY Patient is neurologically stable.  Awaiting transfer to skilled nursing facility when bed becomes available.  No changes.  Vital signs stable.  Vitals:   01/19/19 2340 01/20/19 0352 01/20/19 0700 01/20/19 1100  BP:  (!) 139/92 (!) 162/76 126/70  Pulse: 64 65 66 64  Resp:  16 16 16   Temp:  98.1 F (36.7 C) (!) 97.5 F (36.4 C) (!) 97.5 F (36.4 C)  TempSrc:  Oral Axillary Axillary  SpO2:  98% 98% 100%  Weight:      Height:        CBC:  Recent Labs  Lab 01/16/19 1820  WBC 5.3  NEUTROABS 2.2  HGB 10.1*  HCT 30.9*  MCV 83.7  PLT 304    Basic Metabolic Panel:  Recent Labs  Lab 01/16/19 1820  NA 130*  K 3.9  CL 99  CO2 22  GLUCOSE 181*  BUN 24*  CREATININE 1.01*  CALCIUM 8.9     PHYSICAL EXAM  General -frail elderly Caucasian lady, in no apparent distress.  Ophthalmologic - fundi not visualized due to noncooperation.  Cardiovascular - Regular rate and rhythm.  Soft ejection murmur throughout precordium Lungs are clear to auscultation  Mental Status -  Awake alert oriented to person and place only.  Follows simple midline and one-step commands only.  Diminished attention, registration and recall.  Moderate dysarthria but can be understood.  Cranial Nerves II - XII - II - left visual neglect. III, IV, VI - right gaze preference and  able to cross midline. V - Facial sensation intact bilaterally. VII - left facial droop. VIII - Hearing & vestibular intact bilaterally. X - Palate elevates symmetrically, moderate dysarthria. XI - Chin turning & shoulder shrug intact bilaterally. XII - Tongue protrusion intact.  Motor Strength - The patient's strength was normal in RUE and RLE extremities, however left UE 0/5 and LLE 3-/5.  Bulk was normal and fasciculations were absent.   Motor Tone - Muscle tone was assessed at the neck and appendages and was increased on the left UE.  Reflexes - The patient's reflexes were  symmetrical in all extremities except left upper extremity brisk reflexes   Sensory - Light touch, temperature/pinprick were assessed and were symmetrical.    Coordination - The patient had normal movements in the right hand with no ataxia or dysmetria.  Tremor was absent.  Gait and Station - deferred.   ASSESSMENT/PLAN [name] is a 85 y.o. female with history of HTN, cardiac pacemaker for SSS, bradycardia, and hypercholesterolemia  Presenting to [facility] ED with L sided weakness. Received IV tPA 01/16/2019 at 1911.   Stroke:  R brain infarct s/p tPA, likely embolic secondary to known AF not on [facility]  Code Stroke CT head 7/16 1841 No acute abnormality. Small vessel disease. Atrophy. ASPECTS 10.     CTA head & neck no ELVO. L ICA 40%, inominate 50%  CT perfusion no infarct, no penumbra  CT head 7/16 2203 unchanged.   CT head 01/16/233 unchanged  CT head 24 h post tPA - No acute findings. Moderate chronic ischemic microvascular disease.  CT Head 01/19/19 - No change in the head CT over the last few studies  Pacer interrogation showed 2hr afib on 10/21/18  2D Echo EF 55-60%  LDL 86  HgbA1c 6.3  SCDs for VTE prophylaxis  aspirin 81 mg daily prior to admission, now on Eliquis (apixaban) daily   Therapy recommendations:  SNF  Disposition:  pending   Medically ready for d/c when bed found  PAF  Home anticoagulation:  none  Has pacer last checked 4/28 with 2h AF documented on 4/20. Will have interrogated  CHA2DS2-VASc Score = at least 7, ?2 oral anticoagulation recommended  Age in Years:  ?23   +2    Sex:  Female   Female   +1    Hypertension History:  yes   +1     Diabetes Mellitus:  yes   +1  Congestive Heart Failure History:  0  Vascular Disease History:  0     Stroke/TIA/Thromboembolism History:  yes   +2  Rate controlled  Pacer interrogation showed 2hr afib on 10/21/18  Dr. Lovena Le recommend eliquis in 10/2018 but pt declined.  . Eliquis started  Saturday 01/18/19 (after Dr Clydene Fake discussion with pt's daughter)   Hypertension  Home meds:  norvasc 5, enalapril 5  Stable . BP goal normotensive  Hyperlipidemia  Home meds:  Fish oil  LDL 86, goal < 70  Added pravastatin 20mg   Continue statin at discharge  Other Stroke Risk Factors  Advanced age  Former Cigarette smoker, quit 37 yrsgo  Bradycardia on pacer - follows with Dr. Lovena Le  Other Active Problems  GERD on prilosec  Hyponatremia  Pena Thompson day # 4 Continue present treatment.  Medically stable for transfer to skilled nursing facility for rehabilitation when bed becomes available.  Discussed with case Freight forwarder. Antony Contras, MD Medical Director [facility] Pager: [phone] 01/20/2019 4:25 PM   To contact Stroke Continuity provider, please refer to [email or web address] After hours, contact General Neurology

## 2019-01-21 ENCOUNTER — Telehealth: Payer: Self-pay | Admitting: Internal Medicine

## 2019-01-21 DIAGNOSIS — R4701 Aphasia: Secondary | ICD-10-CM | POA: Diagnosis not present

## 2019-01-21 DIAGNOSIS — I480 Paroxysmal atrial fibrillation: Secondary | ICD-10-CM | POA: Diagnosis not present

## 2019-01-21 DIAGNOSIS — I6300 Cerebral infarction due to thrombosis of unspecified precerebral artery: Secondary | ICD-10-CM | POA: Diagnosis not present

## 2019-01-21 DIAGNOSIS — I10 Essential (primary) hypertension: Secondary | ICD-10-CM | POA: Diagnosis not present

## 2019-01-21 DIAGNOSIS — G459 Transient cerebral ischemic attack, unspecified: Secondary | ICD-10-CM | POA: Diagnosis not present

## 2019-01-21 DIAGNOSIS — U071 COVID-19: Secondary | ICD-10-CM | POA: Diagnosis not present

## 2019-01-21 DIAGNOSIS — E559 Vitamin D deficiency, unspecified: Secondary | ICD-10-CM | POA: Diagnosis not present

## 2019-01-21 DIAGNOSIS — E785 Hyperlipidemia, unspecified: Secondary | ICD-10-CM | POA: Diagnosis present

## 2019-01-21 DIAGNOSIS — I63411 Cerebral infarction due to embolism of right middle cerebral artery: Secondary | ICD-10-CM | POA: Diagnosis not present

## 2019-01-21 DIAGNOSIS — R402411 Glasgow coma scale score 13-15, in the field [EMT or ambulance]: Secondary | ICD-10-CM | POA: Diagnosis not present

## 2019-01-21 DIAGNOSIS — D649 Anemia, unspecified: Secondary | ICD-10-CM | POA: Diagnosis not present

## 2019-01-21 DIAGNOSIS — D519 Vitamin B12 deficiency anemia, unspecified: Secondary | ICD-10-CM | POA: Diagnosis not present

## 2019-01-21 DIAGNOSIS — M6281 Muscle weakness (generalized): Secondary | ICD-10-CM | POA: Diagnosis not present

## 2019-01-21 DIAGNOSIS — G8194 Hemiplegia, unspecified affecting left nondominant side: Secondary | ICD-10-CM | POA: Diagnosis not present

## 2019-01-21 DIAGNOSIS — I6310 Cerebral infarction due to embolism of unspecified precerebral artery: Secondary | ICD-10-CM | POA: Diagnosis not present

## 2019-01-21 DIAGNOSIS — I69392 Facial weakness following cerebral infarction: Secondary | ICD-10-CM | POA: Diagnosis not present

## 2019-01-21 DIAGNOSIS — R278 Other lack of coordination: Secondary | ICD-10-CM | POA: Diagnosis not present

## 2019-01-21 DIAGNOSIS — K219 Gastro-esophageal reflux disease without esophagitis: Secondary | ICD-10-CM | POA: Diagnosis not present

## 2019-01-21 DIAGNOSIS — I69398 Other sequelae of cerebral infarction: Secondary | ICD-10-CM | POA: Diagnosis not present

## 2019-01-21 LAB — GLUCOSE, CAPILLARY
Glucose-Capillary: 194 mg/dL — ABNORMAL HIGH (ref 70–99)
Glucose-Capillary: 126 mg/dL — ABNORMAL HIGH (ref 70–99)

## 2019-01-21 MED ORDER — PRAVASTATIN SODIUM 20 MG PO TABS: 20.0000 mg | ORAL_TABLET | Freq: Every day | ORAL | Status: DC

## 2019-01-21 MED ORDER — APIXABAN 2.5 MG PO TABS: 2.5000 mg | ORAL_TABLET | Freq: Two times a day (BID) | ORAL | Status: DC

## 2019-01-21 NOTE — Telephone Encounter (Signed)
I let the pt daughter know I did change the pt home remote appointment. I changed the appointment to 02-28-2019 and told her daughter just to call to let me know if her mom still can not make the appointment. The patient daughter verbalized understanding.

## 2019-01-21 NOTE — Telephone Encounter (Signed)
New Message    1. Has your device fired? no  2. Is you device beeping? No   3. Are you experiencing draining or swelling at device site? No   4. Are you calling to see if we received your device transmission? No   5. Have you passed out? No    Patients daughter is calling on her behalf. She needs to cancel the 7/28 remote transmission. Patient is currently in the hospital and will be leaving the hospital for rehab for two weeks.       Please route to [place]

## 2019-01-21 NOTE — Discharge Summary (Addendum)
Stroke Discharge Summary  Patient ID: Kelsey Thompson   MRN: [id number]      DOB: [birth year]  Date of Admission: 01/16/2019 Date of Discharge: 01/21/2019  Attending Physician:  Micki Riley, MD, Stroke MD Consultant(s):    None  Patient's PCP:  Mechele Claude, MD  DISCHARGE DIAGNOSIS:  Principal Problem:   Embolic stroke (HCC) R brain s/p tPA d/t AF Active Problems:   Essential hypertension   GERD   Cardiac pacemaker in situ   Paroxysmal atrial fibrillation (HCC)   Hyperlipidemia LDL goal <70   Past Medical History:  Diagnosis Date  . Arthritis   . Bradycardia   . GERD (gastroesophageal reflux disease)   . Hiatal hernia   . Hypercholesterolemia   . Hypertension   . Pacemaker    Past Surgical History:  Procedure Laterality Date  . APPENDECTOMY    . CESAREAN SECTION    . PACEMAKER INSERTION    . TONSILLECTOMY      Allergies as of 01/21/2019      Reactions   Horse-derived Products Other (See Comments)   Tetanus:Reaction unknown   Penicillins Other (See Comments)   Did it involve swelling of the face/tongue/throat, SOB, or low BP? Yes-swelling Did it involve sudden or severe rash/hives, skin peeling, or any reaction on the inside of your mouth or nose? No Did you need to seek medical attention at a hospital or doctor's office? Yes-Dr. office When did it last happen?4 years prior If all above answers are "NO", may proceed with cephalosporin use.   Tetanus Antitoxin    Unknown reaction      Medication List    STOP taking these medications   aspirin EC 81 MG tablet   fish oil-omega-3 fatty acids 1000 MG capsule     TAKE these medications   acetaminophen 500 MG tablet Commonly known as: TYLENOL Take 500 mg by mouth every 6 (six) hours as needed for mild pain or moderate pain.   amLODipine 5 MG tablet Commonly known as: NORVASC Take 1 tablet (5 mg total) by mouth daily.   apixaban 2.5 MG Tabs tablet Commonly known as: ELIQUIS Take 1 tablet  (2.5 mg total) by mouth 2 (two) times daily.   calcium-vitamin D 500-200 MG-UNIT tablet Commonly known as: OSCAL WITH D Take 1 tablet by mouth daily.   enalapril 5 MG tablet Commonly known as: VASOTEC Take 1 tablet (5 mg total) by mouth daily.   omeprazole 20 MG capsule Commonly known as: PRILOSEC Take 1 capsule (20 mg total) by mouth daily.   pravastatin 20 MG tablet Commonly known as: PRAVACHOL Take 1 tablet (20 mg total) by mouth daily at 6 PM.   vitamin C 500 MG tablet Commonly known as: ASCORBIC ACID Take 500 mg by mouth daily.       LABORATORY STUDIES CBC    Component Value Date/Time   WBC 5.3 01/16/2019 1820   RBC 3.69 (L) 01/16/2019 1820   HGB 10.1 (L) 01/16/2019 1820   HGB 10.2 (L) 01/08/2019 1415   HCT 30.9 (L) 01/16/2019 1820   HCT 30.9 (L) 01/08/2019 1415   PLT 304 01/16/2019 1820   PLT 335 01/08/2019 1415   MCV 83.7 01/16/2019 1820   MCV 84 01/08/2019 1415   MCH 27.4 01/16/2019 1820   MCHC 32.7 01/16/2019 1820   RDW 14.9 01/16/2019 1820   RDW 14.4 01/08/2019 1415   LYMPHSABS 2.0 01/16/2019 1820   LYMPHSABS 1.7 01/08/2019 1415   MONOABS  0.8 01/16/2019 1820   EOSABS 0.2 01/16/2019 1820   EOSABS 0.2 01/08/2019 1415   BASOSABS 0.1 01/16/2019 1820   BASOSABS 0.1 01/08/2019 1415   CMP    Component Value Date/Time   NA 130 (L) 01/16/2019 1820   NA 133 (L) 01/08/2019 1415   K 3.9 01/16/2019 1820   CL 99 01/16/2019 1820   CO2 22 01/16/2019 1820   GLUCOSE 181 (H) 01/16/2019 1820   BUN 24 (H) 01/16/2019 1820   BUN 16 01/08/2019 1415   CREATININE 1.01 (H) 01/16/2019 1820   CALCIUM 8.9 01/16/2019 1820   PROT 7.4 01/16/2019 1820   PROT 7.0 01/08/2019 1415   ALBUMIN 3.8 01/16/2019 1820   ALBUMIN 4.2 01/08/2019 1415   AST 19 01/16/2019 1820   ALT 15 01/16/2019 1820   ALKPHOS 81 01/16/2019 1820   BILITOT 0.4 01/16/2019 1820   BILITOT 0.2 01/08/2019 1415   GFRNONAA 47 (L) 01/16/2019 1820   GFRAA 54 (L) 01/16/2019 1820   COAGS Lab Results   Component Value Date   INR 1.0 01/16/2019   INR 1.0 08/14/2008   Lipid Panel    Component Value Date/Time   CHOL 155 01/17/2019 0539   CHOL 164 01/08/2019 1415   TRIG 21 01/17/2019 0539   HDL 65 01/17/2019 0539   HDL 63 01/08/2019 1415   CHOLHDL 2.4 01/17/2019 0539   VLDL 4 01/17/2019 0539   LDLCALC 86 01/17/2019 0539   LDLCALC 86 01/08/2019 1415   HgbA1C  Lab Results  Component Value Date   HGBA1C 6.3 (H) 01/17/2019   Urinalysis    Component Value Date/Time   COLORURINE STRAW (A) 01/16/2019 2125   APPEARANCEUR CLEAR 01/16/2019 2125   LABSPEC 1.028 01/16/2019 2125   PHURINE 6.0 01/16/2019 2125   GLUCOSEU 50 (A) 01/16/2019 2125   HGBUR NEGATIVE 01/16/2019 2125   BILIRUBINUR NEGATIVE 01/16/2019 2125   KETONESUR NEGATIVE 01/16/2019 2125   PROTEINUR NEGATIVE 01/16/2019 2125   UROBILINOGEN 0.2 07/02/2011 0000   NITRITE NEGATIVE 01/16/2019 2125   LEUKOCYTESUR NEGATIVE 01/16/2019 2125   Urine Drug Screen     Component Value Date/Time   LABOPIA NONE DETECTED 01/16/2019 2125   COCAINSCRNUR NONE DETECTED 01/16/2019 2125   LABBENZ NONE DETECTED 01/16/2019 2125   AMPHETMU NONE DETECTED 01/16/2019 2125   THCU NONE DETECTED 01/16/2019 2125   LABBARB NONE DETECTED 01/16/2019 2125    Alcohol Level    Component Value Date/Time   ETH <10 01/16/2019 1820     SIGNIFICANT DIAGNOSTIC STUDIES  Ct Head Code Stroke Wo Contrast 01/16/2019 1850 1. No acute abnormality 2. Atrophy and extensive chronic microvascular ischemic change 3. ASPECTS is 10   Ct Code Stroke Cta Head W/wo Contrast Ct Code Stroke Cta Neck W/wo Contrast Ct Code Stroke Cta Cerebral Perfusion W/wo Contrast 01/16/2019 2042 1. CT perfusion negative for acute ischemia or infarction 2. Negative for emergent large vessel occlusion 3. 40% diameter stenosis proximal left internal carotid artery. Right carotid both vertebral arteries widely patent. Mild atherosclerotic stenosis in the cavernous carotid bilaterally. 50%  diameter stenosis origin of innominate artery.   Ct Head Wo Contrast 01/16/2019 2211 Atrophy, chronic microvascular disease. No acute intracranial abnormality. Electronically Signed   By: Charlett NoseKevin  Dover M.D.   On: 01/16/2019 22:11   Ct Head Wo Contrast 01/17/2019 0257 1. Stable exam.  No acute intracranial abnormality identified. 2. Atrophy with advanced chronic microvascular ischemic disease.   Ct Head Wo Contrast 01/19/2019 1045 No change in the head CT over the  last few studies. Atrophy and extensive chronic small-vessel ischemic changes. Focal calcification in a right MCA branch in the sylvian fissure presumably represents a small nonocclusive embolic lesion. This was not present in 2017. On the CT angiogram. This did not appear to cause occlusion of distal flow.   Ct Head Wo Contrast  01/17/2019 1821 No acute findings. Moderate chronic ischemic microvascular disease.   2D Echocardiogram  1. The left ventricle has normal systolic function, with an ejection fraction of 55-60%. The cavity size was normal. There is mild concentric left ventricular hypertrophy. Left ventricular diastolic Doppler parameters are consistent with  pseudonormalization. Elevated mean left atrial pressure No evidence of left ventricular regional wall motion abnormalities.  2. The right ventricle has normal systolic function. The cavity was normal. There is no increase in right ventricular wall thickness. Right ventricular systolic pressure is mildly elevated with an estimated pressure of 46.2 mmHg.  3. Left atrial size was mildly dilated.  4. Trivial pericardial effusion is present.  5. Mild thickening of the mitral valve leaflet.  6. Tricuspid valve regurgitation is mild-moderate.  7. The aortic valve is tricuspid. Mild thickening of the aortic valve. Mild calcification of the aortic valve. Mild stenosis of the aortic valve.  8. The aortic root and ascending aorta are normal in size and structure.  9. The inferior  vena cava was dilated in size with >50% respiratory variability.      HISTORY OF PRESENT ILLNESS Kelsey Thompson is an 85 y.o. female with a PMHx of HTN and cardiac pacemaker for SSS, bradycardia, HTN and hypercholesterolemia, who presented to [facility] via EMS for acute onset of left sided weakness. This occurred at 1720 (LKW 1720 on 01/16/2019), while at a restaurant. At onset, she became confused and LUE was completely flaccid. She was alert and fully oriented on arrival, describing weakness of her left hand. On Teleneurology assessment the LUE had improved but there was still moderate LUE weakness and some left-sided numbness. NIHSS was 4.  CT head at the [facility] ED showed no acute abnormality. Atrophy and extensive chronic microvascular ischemic changes were noted. ASPECTS was 10. Teleneurology consultant recommended tPA for probable acute right MCA embolic stroke, which patient consented to. During tPA infusion a CTA of head and neck with CTP was performed, revealing no LVO or perfusion deficit. 40% diameter stenosis of the proximal left internal carotid artery was noted, with right carotid and both vertebral arteries noted to be widely patent. Mild atherosclerotic stenosis in the cavernous carotid bilaterally and 50% diameter stenosis origin of innominate artery also seen. At approximately 9:50 PM the patient was sent back to CT for an acute neuro change, to rule out possible hemorrhagic complication of tPA. Repeat CT was negative for hemorrhage or other new finding. The patient is on 81 mg qd of ASA at home. She has no prior history of stroke. Recent interrogation of her pacemaker showed an arrhythmia that may have been PAF - she was recommended to start Eliquis previously, but patient declined.    HOSPITAL COURSE Kelsey Thompson is a 85 y.o. female with history of HTN, cardiac pacemaker for SSS, bradycardia, and hypercholesterolemia  [facility] with L sided weakness. Received IV tPA  01/16/2019 at 1911.   Stroke:  R brain infarct s/p tPA, likely embolic secondary to [facility]  Code Stroke CT head 7/16 1841 No acute abnormality. Small vessel disease. Atrophy. ASPECTS 10.     CTA  head & neck no ELVO. L ICA 40%, inominate 50%  CT perfusion no infarct, no penumbra  CT head 7/16 2203 unchanged.   CT head 01/16/233 unchanged  CT head 24 h post tPA - No acute findings. Moderate chronic ischemic microvascular disease.  CT Head 01/19/19 - No change in the head CT over the last few studies  Pacer interrogation showed 2hr afib on 10/21/18  2D Echo EF 55-60%  LDL 86  HgbA1c 6.3  aspirin 81 mg daily prior to admission, now on Eliquis (apixaban) daily   Therapy recommendations:  SNF  Disposition:  SNF  Paroxsymal atrial fibrillation  Home anticoagulation:  none  Has pacer last checked 4/28 with 2h AF documented on 4/20.   CHA2DS2-VASc Score = at least 7, ?2 oral anticoagulation recommended             Age in Years:  ?17   +2                        Sex:  Female   Female   +1                      Hypertension History:  yes   +1                        Diabetes Mellitus:  yes   +1      Congestive Heart Failure History:  0             Vascular Disease History:  0                           Stroke/TIA/Thromboembolism History:  yes   +2  Rate controlled  Pacer interrogation during hospitalization showed 2hr afib on 10/21/18  Dr. Lovena Le recommend eliquis in 10/2018 but pt declined.   Eliquis started Saturday 01/18/19 (after Dr Clydene Fake discussion with pt's daughter)   Hypertension  Home meds:  norvasc 5, enalapril 5  Stable  BP goal normotensive  Hyperlipidemia  Home meds:  Fish oil  LDL 86, goal < 70  Added pravastatin 20mg   Continue statin at discharge  Other Stroke Risk Factors  Advanced age  Former Cigarette smoker, quit 37 yrs ago  Bradycardia on pacer - follows with Dr. Lovena Le  Other Active Problems  GERD on  prilosec  Hyponatremia  130   DISCHARGE EXAM Blood pressure (!) 155/92, pulse 84, temperature 98.8 F (37.1 C), temperature source Axillary, resp. rate 18, height 5\' 3"  (1.6 m), weight 54.4 kg, SpO2 92 %. General -frail elderly Caucasian lady, in no apparent distress.  Ophthalmologic - fundi not visualized due to noncooperation.  Cardiovascular - Regular rate and rhythm.  Soft ejection murmur throughout precordium Lungs are clear to auscultation  Mental Status -  Awake alert oriented to person and place only.  Follows simple midline and one-step commands only.  Diminished attention, registration and recall.  Moderate dysarthria but can be understood.  Cranial Nerves II - XII - II - left visual neglect. III, IV, VI - right gaze preference and  able to cross midline. V - Facial sensation intact bilaterally. VII - left facial droop. VIII - Hearing & vestibular intact bilaterally. X - Palate elevates symmetrically, moderate dysarthria. XI - Chin turning & shoulder shrug intact bilaterally. XII - Tongue protrusion intact.  Motor Strength - The patient's strength was normal in RUE and RLE  extremities, however left UE 0/5 and LLE 3-/5.  Bulk was normal and fasciculations were absent.   Motor Tone - Muscle tone was assessed at the neck and appendages and was increased on the left UE.  Reflexes - The patient's reflexes were symmetrical in all extremities except left upper extremity brisk reflexes   Sensory - Light touch, temperature/pinprick were assessed and were symmetrical.    Coordination - The patient had normal movements in the right hand with no ataxia or dysmetria.  Tremor was absent.  Gait and Station - deferred.  Discharge Diet   Dysphagia 2 thin liquids  DISCHARGE PLAN  Disposition:  Skilled nursing facility for ongoing PT, OT and ST.   Eliquis (apixaban) daily for secondary stroke prevention  Ongoing stroke risk factor control by Primary Care Physician at time  of discharge  Follow-up Mechele ClaudeStacks, [place], MD in 2 weeks or MD at SNF  [facility] in 4 weeks, office to schedule an appointment.   40 minutes were spent preparing discharge.  Annie MainSharon Biby, MSN, APRN, ANVP-BC, AGPCNP-BC Advanced Practice Stroke Nurse [facility] See Amion for Schedule & Pager information 01/21/2019 11:07 AM   I have personally obtained history,examined this patient, reviewed notes, independently viewed imaging studies, participated in medical decision making and plan of care.ROS completed by me personally and pertinent positives fully documented  I have made any additions or clarifications directly to the above note. Agree with note above.    Delia HeadyPramod Sethi, MD Medical Director [facility] Pager: [phone] 01/21/2019 2:07 PM

## 2019-01-21 NOTE — TOC Transition Note (Signed)
Transition of Care [facility]) - CM/SW Discharge Note   Patient Details  Name: Kelsey Thompson MRN: [id number] Date of Birth: [birth year]  Transition of Care [facility]) CM/SW Contact:  Geralynn Ochs, LCSW Phone Number: 01/21/2019, 11:37 AM   Clinical Narrative:  Nurse to call report to [phone], Room 34     Final next level of care: Skilled Nursing Facility Barriers to Discharge: Barriers Resolved   Patient Goals and CMS Choice Patient states their goals for this hospitalization and ongoing recovery are:: Pt daughter would like her mother to receive rehab before returning home CMS Medicare.gov Compare Post Acute Care list provided to:: Patient Represenative (must comment) Choice offered to / list presented to : Adult Children  Discharge Placement              Patient chooses bed at: [facility] Patient to be transferred to facility by: [place] Name of family member notified: [name] Patient and family notified of of transfer: 01/21/19  Discharge Plan and Services In-house Referral: Clinical Social Work Discharge Planning Services: NA Post Acute Care Choice: [place]          DME Arranged: N/A DME Agency: NA       HH Arranged: NA HH Agency: NA        Social Determinants of Health (SDOH) Interventions     Readmission Risk Interventions No flowsheet data found.

## 2019-01-22 DIAGNOSIS — E785 Hyperlipidemia, unspecified: Secondary | ICD-10-CM | POA: Diagnosis not present

## 2019-01-22 DIAGNOSIS — I10 Essential (primary) hypertension: Secondary | ICD-10-CM | POA: Diagnosis not present

## 2019-01-22 DIAGNOSIS — K219 Gastro-esophageal reflux disease without esophagitis: Secondary | ICD-10-CM | POA: Diagnosis not present

## 2019-01-22 DIAGNOSIS — I480 Paroxysmal atrial fibrillation: Secondary | ICD-10-CM | POA: Diagnosis not present

## 2019-01-23 DIAGNOSIS — I10 Essential (primary) hypertension: Secondary | ICD-10-CM | POA: Diagnosis not present

## 2019-01-23 DIAGNOSIS — I480 Paroxysmal atrial fibrillation: Secondary | ICD-10-CM | POA: Diagnosis not present

## 2019-01-23 DIAGNOSIS — G8194 Hemiplegia, unspecified affecting left nondominant side: Secondary | ICD-10-CM | POA: Diagnosis not present

## 2019-01-23 DIAGNOSIS — E785 Hyperlipidemia, unspecified: Secondary | ICD-10-CM | POA: Diagnosis not present

## 2019-01-23 DIAGNOSIS — D649 Anemia, unspecified: Secondary | ICD-10-CM | POA: Diagnosis not present

## 2019-01-25 DIAGNOSIS — D649 Anemia, unspecified: Secondary | ICD-10-CM | POA: Diagnosis not present

## 2019-01-25 DIAGNOSIS — E559 Vitamin D deficiency, unspecified: Secondary | ICD-10-CM | POA: Diagnosis not present

## 2019-01-25 DIAGNOSIS — M6281 Muscle weakness (generalized): Secondary | ICD-10-CM | POA: Diagnosis not present

## 2019-01-25 DIAGNOSIS — D519 Vitamin B12 deficiency anemia, unspecified: Secondary | ICD-10-CM | POA: Diagnosis not present

## 2019-01-28 DIAGNOSIS — E785 Hyperlipidemia, unspecified: Secondary | ICD-10-CM | POA: Diagnosis not present

## 2019-01-28 DIAGNOSIS — I10 Essential (primary) hypertension: Secondary | ICD-10-CM | POA: Diagnosis not present

## 2019-01-28 DIAGNOSIS — I480 Paroxysmal atrial fibrillation: Secondary | ICD-10-CM | POA: Diagnosis not present

## 2019-01-30 DIAGNOSIS — I480 Paroxysmal atrial fibrillation: Secondary | ICD-10-CM | POA: Diagnosis not present

## 2019-01-30 DIAGNOSIS — I10 Essential (primary) hypertension: Secondary | ICD-10-CM | POA: Diagnosis not present

## 2019-01-30 DIAGNOSIS — D649 Anemia, unspecified: Secondary | ICD-10-CM | POA: Diagnosis not present

## 2019-01-31 DIAGNOSIS — U071 COVID-19: Secondary | ICD-10-CM | POA: Diagnosis not present

## 2019-02-01 DIAGNOSIS — M6281 Muscle weakness (generalized): Secondary | ICD-10-CM | POA: Diagnosis not present

## 2019-02-01 DIAGNOSIS — E785 Hyperlipidemia, unspecified: Secondary | ICD-10-CM | POA: Diagnosis not present

## 2019-02-01 DIAGNOSIS — G8194 Hemiplegia, unspecified affecting left nondominant side: Secondary | ICD-10-CM | POA: Diagnosis not present

## 2019-02-01 DIAGNOSIS — K219 Gastro-esophageal reflux disease without esophagitis: Secondary | ICD-10-CM | POA: Diagnosis not present

## 2019-02-01 DIAGNOSIS — R278 Other lack of coordination: Secondary | ICD-10-CM | POA: Diagnosis not present

## 2019-02-01 DIAGNOSIS — I480 Paroxysmal atrial fibrillation: Secondary | ICD-10-CM | POA: Diagnosis not present

## 2019-02-01 DIAGNOSIS — I10 Essential (primary) hypertension: Secondary | ICD-10-CM | POA: Diagnosis not present

## 2019-02-01 DIAGNOSIS — I69398 Other sequelae of cerebral infarction: Secondary | ICD-10-CM | POA: Diagnosis not present

## 2019-02-01 DIAGNOSIS — I63411 Cerebral infarction due to embolism of right middle cerebral artery: Secondary | ICD-10-CM | POA: Diagnosis not present

## 2019-02-03 DIAGNOSIS — G8194 Hemiplegia, unspecified affecting left nondominant side: Secondary | ICD-10-CM | POA: Diagnosis not present

## 2019-02-03 DIAGNOSIS — I480 Paroxysmal atrial fibrillation: Secondary | ICD-10-CM | POA: Diagnosis not present

## 2019-02-03 DIAGNOSIS — E785 Hyperlipidemia, unspecified: Secondary | ICD-10-CM | POA: Diagnosis not present

## 2019-02-03 DIAGNOSIS — I10 Essential (primary) hypertension: Secondary | ICD-10-CM | POA: Diagnosis not present

## 2019-02-12 DIAGNOSIS — E785 Hyperlipidemia, unspecified: Secondary | ICD-10-CM | POA: Diagnosis not present

## 2019-02-12 DIAGNOSIS — G8194 Hemiplegia, unspecified affecting left nondominant side: Secondary | ICD-10-CM | POA: Diagnosis not present

## 2019-02-12 DIAGNOSIS — I480 Paroxysmal atrial fibrillation: Secondary | ICD-10-CM | POA: Diagnosis not present

## 2019-02-12 DIAGNOSIS — I10 Essential (primary) hypertension: Secondary | ICD-10-CM | POA: Diagnosis not present

## 2019-02-19 DIAGNOSIS — G8194 Hemiplegia, unspecified affecting left nondominant side: Secondary | ICD-10-CM | POA: Diagnosis not present

## 2019-02-19 DIAGNOSIS — I480 Paroxysmal atrial fibrillation: Secondary | ICD-10-CM | POA: Diagnosis not present

## 2019-02-19 DIAGNOSIS — I10 Essential (primary) hypertension: Secondary | ICD-10-CM | POA: Diagnosis not present

## 2019-02-19 DIAGNOSIS — E785 Hyperlipidemia, unspecified: Secondary | ICD-10-CM | POA: Diagnosis not present

## 2019-02-20 DIAGNOSIS — K219 Gastro-esophageal reflux disease without esophagitis: Secondary | ICD-10-CM | POA: Diagnosis not present

## 2019-02-20 DIAGNOSIS — I69398 Other sequelae of cerebral infarction: Secondary | ICD-10-CM | POA: Diagnosis not present

## 2019-02-20 DIAGNOSIS — I480 Paroxysmal atrial fibrillation: Secondary | ICD-10-CM | POA: Diagnosis not present

## 2019-02-20 DIAGNOSIS — G8194 Hemiplegia, unspecified affecting left nondominant side: Secondary | ICD-10-CM | POA: Diagnosis not present

## 2019-02-26 ENCOUNTER — Ambulatory Visit (INDEPENDENT_AMBULATORY_CARE_PROVIDER_SITE_OTHER): Payer: Medicare Other | Admitting: Adult Health

## 2019-02-26 ENCOUNTER — Other Ambulatory Visit: Payer: Self-pay

## 2019-02-26 ENCOUNTER — Encounter: Payer: Self-pay | Admitting: Adult Health

## 2019-02-26 VITALS — BP 132/78 | HR 74 | Temp 97.5°F | Ht 63.0 in | Wt 108.2 lb

## 2019-02-26 DIAGNOSIS — I480 Paroxysmal atrial fibrillation: Secondary | ICD-10-CM | POA: Diagnosis not present

## 2019-02-26 DIAGNOSIS — I63411 Cerebral infarction due to embolism of right middle cerebral artery: Secondary | ICD-10-CM

## 2019-02-26 DIAGNOSIS — I10 Essential (primary) hypertension: Secondary | ICD-10-CM

## 2019-02-26 DIAGNOSIS — E785 Hyperlipidemia, unspecified: Secondary | ICD-10-CM

## 2019-02-26 NOTE — Progress Notes (Signed)
[facility] [address] [place]. [place] [id number] [phone]       HOSPITAL FOLLOW UP NOTE  Ms. Kelsey CollumLillie P Blackwell Date of Birth:  January 17, 1921 Medical Record Number:  [id number]   Reason for Referral:  hospital stroke follow up    CHIEF COMPLAINT:  Chief Complaint  Patient presents with  . Hospitalization Follow-up    Caregiver present. Treatment room. Patient mentioned that she feels that she is doing well.     HPI: Kelsey NeighboursLillie P Carteris being seen today for in office hospital follow-up regarding embolic stroke status post TPA due [facility].  History obtained from patient, facility CNA and chart review. Reviewed all radiology images and labs personally.  KelseyAmaal P Carteris a 85 y.o.femalewith history of HTN, cardiac pacemaker for SSS, bradycardia, and hypercholesterolemia Presenting to [facility] ED with [facility] with L sided weakness and confusion.  Consulted by teleneurology with NIHSS.  CT head negative for acute abnormality.  Received TPA without complication.  CTA head/neck with CTP negative for LVO or perfusion deficit with 40% diameter stenosis of the proximal left ICA with right carotid and both vertebral arteries widely patent.  Post TPA CT negative.  Repeat CT head unremarkable.  Unable to do MRI due to pacer.  Pacer interrogation showed 2-hour A. fib on 10/21/2018.  She did have follow-up previously with cardiologist Dr. Ladona Ridgelaylor who recommended initiating Eliquis in 10/2018 but patient declined.  2D echo normal EF.  Recommended initiating Eliquis as recent stroke symptoms likely secondary to AF not on AC.  HTN stable.  LDL 86 and recommended initiating pravastatin 20 mg daily.  Other stroke risk factors include advanced age, former tobacco use and bradycardia upon pacer.  Residual deficits of left hemiparesis, dysarthria and dysphasia and was discharged to SNF in stable condition for ongoing therapy needs.  She is being seen  today for hospital follow-up [facility].  She does continue to have mild left-sided weakness with left hand numbness, left facial droop and cognitive deficit but overall improving.  She continues to work with PT/OT/ST with ongoing improvement.  Is able to ambulate with a rolling walker but will use wheelchair for long distance.  [facility]  Previously living independently.  She has continued on Eliquis without bleeding or bruising.  Continues on pravastatin without myalgias.  Blood pressures today satisfactory 132/78.  No further concerns at this time.    ROS:   14 system review of systems performed and negative with exception of memory loss and numbness  PMH:  Past Medical History:  Diagnosis Date  . Arthritis   . Bradycardia   . GERD (gastroesophageal reflux disease)   . Hiatal hernia   . Hypercholesterolemia   . Hypertension   . Pacemaker     PSH:  Past Surgical History:  Procedure Laterality Date  . APPENDECTOMY    . CESAREAN SECTION    . PACEMAKER INSERTION    . TONSILLECTOMY      Social History:  Social History   Socioeconomic History  . Marital status: Widowed    Spouse name: Not on file  . Number of children: Not on file  . Years of education: Not on file  . Highest education level: Not on file  Occupational History  . Not on file  Social Needs  . Financial resource strain: Not on file  . Food insecurity    Worry: Not on  file    Inability: Not on file  . Transportation needs    Medical: Not on file    Non-medical: Not on file  Tobacco Use  . Smoking status: Former Smoker    Types: Cigarettes    Quit date: 06/30/1981    Years since quitting: 37.6  . Smokeless tobacco: Never Used  Substance and Sexual Activity  . Alcohol use: No    Alcohol/week: 0.0 standard drinks  . Drug use: No  . Sexual activity: Never  Lifestyle  . Physical activity    Days per week: Not on file    Minutes  per session: Not on file  . Stress: Not on file  Relationships  . Social Musicianconnections    Talks on phone: Not on file    Gets together: Not on file    Attends religious service: Not on file    Active member of club or organization: Not on file    Attends meetings of clubs or organizations: Not on file    Relationship status: Not on file  . Intimate partner violence    Fear of current or ex partner: Not on file    Emotionally abused: Not on file    Physically abused: Not on file    Forced sexual activity: Not on file  Other Topics Concern  . Not on file  Social History Narrative  . Not on file    Family History:  Family History  Problem Relation Age of Onset  . Heart failure Mother   . Diabetes Father   . Heart attack Father     Medications:   Current Outpatient Medications on File Prior to Visit  Medication Sig Dispense Refill  . acetaminophen (TYLENOL) 500 MG tablet Take 500 mg by mouth every 6 (six) hours as needed for mild pain or moderate pain.    Marland Kitchen. amLODipine (NORVASC) 5 MG tablet Take 1 tablet (5 mg total) by mouth daily. 90 tablet 1  . apixaban (ELIQUIS) 2.5 MG TABS tablet Take 1 tablet (2.5 mg total) by mouth 2 (two) times daily. 60 tablet   . calcium-vitamin D (OSCAL WITH D) 500-200 MG-UNIT per tablet Take 1 tablet by mouth daily.     . enalapril (VASOTEC) 5 MG tablet Take 1 tablet (5 mg total) by mouth daily. 90 tablet 1  . omeprazole (PRILOSEC) 20 MG capsule Take 1 capsule (20 mg total) by mouth daily. 90 capsule 1  . pravastatin (PRAVACHOL) 20 MG tablet Take 1 tablet (20 mg total) by mouth daily at 6 PM.    . vitamin C (ASCORBIC ACID) 500 MG tablet Take 500 mg by mouth daily.    Marland Kitchen. zinc sulfate 220 (50 Zn) MG capsule Take 220 mg by mouth daily.     No current facility-administered medications on file prior to visit.     Allergies:   Allergies  Allergen Reactions  . Horse-Derived Products Other (See Comments)    Tetanus:Reaction unknown  . Penicillins Other  (See Comments)    Did it involve swelling of the face/tongue/throat, SOB, or low BP? Yes-swelling Did it involve sudden or severe rash/hives, skin peeling, or any reaction on the inside of your mouth or nose? No Did you need to seek medical attention at a hospital or doctor's office? Yes-Dr. office When did it last happen?4 years prior If all above answers are "NO", may proceed with cephalosporin use.   . Tetanus Antitoxin     Unknown reaction     Physical Exam  Vitals:   02/26/19 1050  BP: 132/78  Pulse: 74  Temp: (!) 97.5 F (36.4 C)  TempSrc: Oral  Weight: 108 lb 3.2 oz (49.1 kg)  Height: 5\' 3"  (1.6 m)   Body mass index is 19.17 kg/m. No exam data present  Depression screen [facility] 2/9 02/26/2019  Decreased Interest 0  Down, Depressed, Hopeless 0  PHQ - 2 Score 0     General: Frail pleasant elderly Caucasian female, seated, in no evident distress Head: head normocephalic and atraumatic.   Neck: supple with no carotid or supraclavicular bruits Cardiovascular: regular rate and rhythm, no murmurs Musculoskeletal: no deformity Skin:  no rash/petichiae Vascular:  Normal pulses all extremities   Neurologic Exam Mental Status: Awake and fully alert.   Mild dysarthria.  Oriented to year and city but unable to state office name or month. Recent and remote memory impaired. Attention span, concentration and fund of knowledge slightly impaired. Mood and affect appropriate.  Cranial Nerves: Fundoscopic exam reveals sharp disc margins. Pupils equal, briskly reactive to light. Extraocular movements full without nystagmus. Visual fields full to confrontation. Hearing intact. Facial sensation intact.  Very mild left lower facial weakness Motor: Normal bulk and tone.  LUE: 4/5 with weak grip strength LLE: 4/5 greatest in hip flexor and ankle dorsiflexion RUE: 5/5 RLE: 5/5 Sensory.: intact to touch , pinprick , position and vibratory sensation.  Coordination: Rapid alternating  movements decreased left hand. Finger-to-nose and heel-to-shin performed accurately bilaterally.  Orbits right arm over left arm. Gait and Station: Gait assessment deferred as rolling walker not present during visit Reflexes: 1+ and symmetric. Toes downgoing.     NIHSS  2 Modified Rankin  3 CHA2DS2-VASc 7 HAS-BLED 2   Diagnostic Data (Labs, Imaging, Testing)  Ct Head Code Stroke Wo Contrast 01/16/2019 1850 1. No acute abnormality 2. Atrophy and extensive chronic microvascular ischemic change 3. ASPECTS is 10   Ct Code Stroke Cta Head W/wo Contrast Ct Code Stroke Cta Neck W/wo Contrast Ct Code Stroke Cta Cerebral Perfusion W/wo Contrast 01/16/2019 2042 1. CT perfusion negative for acute ischemia or infarction 2. Negative for emergent large vessel occlusion 3. 40% diameter stenosis proximal left internal carotid artery. Right carotid both vertebral arteries widely patent. Mild atherosclerotic stenosis in the cavernous carotid bilaterally. 50% diameter stenosis origin of innominate artery.   Ct Head Wo Contrast 01/16/2019 2211 Atrophy, chronic microvascular disease. No acute intracranial abnormality. Electronically Signed   By: Rolm Baptise M.D.   On: 01/16/2019 22:11   Ct Head Wo Contrast 01/17/2019 0257 1. Stable exam.  No acute intracranial abnormality identified. 2. Atrophy with advanced chronic microvascular ischemic disease.   Ct Head Wo Contrast 01/19/2019 1045 No change in the head CT over the last few studies.Atrophy and extensive chronic small-vessel ischemic changes. Focal calcification in a right MCA branch in the sylvian fissure presumably represents a small nonocclusive embolic lesion. This was not present in 2017. On the CT angiogram. This did not appear to cause occlusion of distal flow.   Ct Head Wo Contrast 01/17/2019 1821 No acute findings. Moderate chronic ischemic microvascular disease.  2D Echocardiogram  1. The left ventricle has normal systolic  function, with an ejection fraction of 55-60%. The cavity size was normal. There is mild concentric left ventricular hypertrophy. Left ventricular diastolic Doppler parameters are consistent with  pseudonormalization. Elevated mean left atrial pressure No evidence of left ventricular regional wall motion abnormalities. 2. The right ventricle has normal systolic function. The cavity was normal. There  is no increase in right ventricular wall thickness. Right ventricular systolic pressure is mildly elevated with an estimated pressure of 46.2 mmHg. 3. Left atrial size was mildly dilated. 4. Trivial pericardial effusion is present. 5. Mild thickening of the mitral valve leaflet. 6. Tricuspid valve regurgitation is mild-moderate. 7. The aortic valve is tricuspid. Mild thickening of the aortic valve. Mild calcification of the aortic valve. Mild stenosis of the aortic valve. 8. The aortic root and ascending aorta are normal in size and structure. 9. The inferior vena cava was dilated in size with >50% respiratory variability.    ASSESSMENT: Kelsey Thompson is a 85 y.o. year old female presented with left-sided weakness and confusion on 01/16/2019 receiving IV tPA with likely right brain infarct likely secondary to atrial fibrillation not on AC.  MRI unable to be performed due to pacemaker.  Cardiologist previously found atrial fibrillation with recommendation of initiating Eliquis but previously declined.  Vascular risk factors include HTN, HLD and atrial fibrillation.  Has been recovering well from a stroke standpoint with residual mild left hemiparesis, left facial droop, dysarthria and mild cognitive impairment.  She was discharged to SNF [facility] with plans on transitioning to assisted living.    PLAN:  1. Right brain stroke: Continue Eliquis (apixaban) daily  and pravastatin for secondary stroke prevention. Maintain strict control of hypertension with blood pressure goal below 130/90,  diabetes with hemoglobin A1c goal below 6.5% and cholesterol with LDL cholesterol (bad cholesterol) goal below 70 mg/dL.  I also advised the patient to eat a healthy diet with plenty of whole grains, cereals, fruits and vegetables, exercise regularly with at least 30 minutes of continuous activity daily and maintain ideal body weight. 2. Atrial fibrillation: Continue Eliquis and ongoing follow-up with Dr. Ladona Ridgel for management and monitoring 3. HTN: Advised to continue current treatment regimen.  Today's BP stable.  Advised to continue to monitor at home along with continued follow-up with PCP for management 4. HLD: Advised to continue current treatment regimen along with continued follow-up with PCP for future prescribing and monitoring of lipid panel 5. Residual deficits: Continue to participate with PT/OT/ST for ongoing improvement    Follow up in 3 months or call earlier if needed   Greater than 50% of time during this 45 minute visit was spent on counseling, explanation of diagnosis of right brain stroke, reviewing risk factor management of atrial fibrillation, HTN and HLD, planning of further management along with potential future management, and discussion with patient and family answering all questions.    Ihor Austin, AGNP-BC  [facility] [address] [place], [place] [address]  Phone [phone] Fax [phone] Note: This document was prepared with digital dictation and possible smart phrase technology. Any transcriptional errors that result from this process are unintentional.

## 2019-02-26 NOTE — Patient Instructions (Signed)
Continue Eliquis (apixaban) daily  and pravastatin for secondary stroke prevention  Continue to follow up with PCP regarding cholesterol and blood pressure management   Continue to follow Dr. Lovena Le for atrial fibrillation and other cardiac conditions  Continue to work with physical and Occupational Therapy for ongoing improvement  Continue to monitor blood pressure at home  Maintain strict control of hypertension with blood pressure goal below 130/90, diabetes with hemoglobin A1c goal below 6.5% and cholesterol with LDL cholesterol (bad cholesterol) goal below 70 mg/dL. I also advised the patient to eat a healthy diet with plenty of whole grains, cereals, fruits and vegetables, exercise regularly and maintain ideal body weight.  Followup in the future with me in 3 months or call earlier if needed       Thank you for coming to see [place] at [facility]. I hope we have been able to provide you high quality care today.  You may receive a patient satisfaction survey over the next few weeks. We would appreciate your feedback and comments so that we may continue to improve ourselves and the health of our patients.

## 2019-02-28 NOTE — Progress Notes (Signed)
I agree with the above plan 

## 2019-03-06 DIAGNOSIS — I480 Paroxysmal atrial fibrillation: Secondary | ICD-10-CM | POA: Diagnosis not present

## 2019-03-06 DIAGNOSIS — D649 Anemia, unspecified: Secondary | ICD-10-CM | POA: Diagnosis not present

## 2019-03-06 DIAGNOSIS — I10 Essential (primary) hypertension: Secondary | ICD-10-CM | POA: Diagnosis not present

## 2019-03-07 ENCOUNTER — Telehealth: Payer: Self-pay | Admitting: Internal Medicine

## 2019-03-07 NOTE — Telephone Encounter (Signed)
Daughter called and stated that one of the CNAs or other Nursing Assistants will need to be present with the patient during her appointment. She will be transported to and from her appt by her AL facility. Please call the daughter if this would be an issue.

## 2019-03-07 NOTE — Telephone Encounter (Signed)
Returned call to daughter.  Advised Pt does not need an appt.  Pt had virtual visit in May.  Per Dr. Lovena Le Pt to follow up in a year.  Pt saw Dr. Livia Snellen in July and he put in his note to make appt with Dr. Lovena Le.  But there is no documentation supporting why Pt would need an appt.  After Dr. Livia Snellen appt Pt did suffer a stroke and is now taking Eliquis (Pt refused to start Eliquis when she had appt with Dr. Lovena Le in May)  Pt is now in an [facility] living facility:  [facility] [place].  Advised daughter the only thing that is needed is to set Pt up with remotes.  Daughter to drop off box to facility today.  Advised to plug in next to Pt.  Scheduled Pt for remote on Tuesday 03/11/2019.  Cancelled f/u appt.  Recall already entered.  Will make device clinic aware to ensure remote received.

## 2019-03-11 ENCOUNTER — Ambulatory Visit (INDEPENDENT_AMBULATORY_CARE_PROVIDER_SITE_OTHER): Payer: Medicare Other | Admitting: *Deleted

## 2019-03-11 ENCOUNTER — Encounter: Payer: Medicare Other | Admitting: Internal Medicine

## 2019-03-11 DIAGNOSIS — I495 Sick sinus syndrome: Secondary | ICD-10-CM | POA: Diagnosis not present

## 2019-03-11 LAB — CUP PACEART REMOTE DEVICE CHECK
Battery Impedance: 1114 Ohm
Battery Remaining Longevity: 64 mo
Battery Voltage: 2.77 V
Brady Statistic AP VP Percent: 0 %
Brady Statistic AP VS Percent: 25 %
Brady Statistic AS VP Percent: 0 %
Brady Statistic AS VS Percent: 75 %
Date Time Interrogation Session: 20200908173218
Implantable Lead Implant Date: 20100215
Implantable Lead Implant Date: 20100215
Implantable Lead Location: 753859
Implantable Lead Location: 753860
Implantable Lead Model: 5076
Implantable Lead Model: 5076
Implantable Pulse Generator Implant Date: 20100215
Lead Channel Impedance Value: 414 Ohm
Lead Channel Impedance Value: 680 Ohm
Lead Channel Pacing Threshold Amplitude: 0.5 V
Lead Channel Pacing Threshold Amplitude: 1 V
Lead Channel Pacing Threshold Pulse Width: 0.4 ms
Lead Channel Pacing Threshold Pulse Width: 0.4 ms
Lead Channel Setting Pacing Amplitude: 2 V
Lead Channel Setting Pacing Amplitude: 2.5 V
Lead Channel Setting Pacing Pulse Width: 0.4 ms
Lead Channel Setting Sensing Sensitivity: 2.8 mV

## 2019-03-11 NOTE — Telephone Encounter (Signed)
Transmission received.

## 2019-03-11 NOTE — Telephone Encounter (Signed)
LMOVM for pt to return call. [employer] remote transmission needed.

## 2019-03-13 DIAGNOSIS — K219 Gastro-esophageal reflux disease without esophagitis: Secondary | ICD-10-CM | POA: Diagnosis not present

## 2019-03-13 DIAGNOSIS — I10 Essential (primary) hypertension: Secondary | ICD-10-CM | POA: Diagnosis not present

## 2019-03-13 DIAGNOSIS — D649 Anemia, unspecified: Secondary | ICD-10-CM | POA: Diagnosis not present

## 2019-03-13 DIAGNOSIS — D519 Vitamin B12 deficiency anemia, unspecified: Secondary | ICD-10-CM | POA: Diagnosis not present

## 2019-03-19 DIAGNOSIS — I480 Paroxysmal atrial fibrillation: Secondary | ICD-10-CM | POA: Diagnosis not present

## 2019-03-19 DIAGNOSIS — I69398 Other sequelae of cerebral infarction: Secondary | ICD-10-CM | POA: Diagnosis not present

## 2019-03-19 DIAGNOSIS — R600 Localized edema: Secondary | ICD-10-CM | POA: Diagnosis not present

## 2019-03-19 DIAGNOSIS — G8194 Hemiplegia, unspecified affecting left nondominant side: Secondary | ICD-10-CM | POA: Diagnosis not present

## 2019-03-24 DIAGNOSIS — U071 COVID-19: Secondary | ICD-10-CM | POA: Diagnosis not present

## 2019-03-26 ENCOUNTER — Encounter: Payer: Self-pay | Admitting: Cardiology

## 2019-03-26 NOTE — Progress Notes (Signed)
Remote pacemaker transmission.   

## 2019-03-28 DIAGNOSIS — I10 Essential (primary) hypertension: Secondary | ICD-10-CM | POA: Diagnosis not present

## 2019-03-28 DIAGNOSIS — D649 Anemia, unspecified: Secondary | ICD-10-CM | POA: Diagnosis not present

## 2019-03-31 DIAGNOSIS — R279 Unspecified lack of coordination: Secondary | ICD-10-CM | POA: Diagnosis not present

## 2019-03-31 DIAGNOSIS — R5381 Other malaise: Secondary | ICD-10-CM | POA: Diagnosis not present

## 2019-03-31 DIAGNOSIS — I69398 Other sequelae of cerebral infarction: Secondary | ICD-10-CM | POA: Diagnosis not present

## 2019-03-31 DIAGNOSIS — R531 Weakness: Secondary | ICD-10-CM | POA: Diagnosis not present

## 2019-04-02 DIAGNOSIS — I69392 Facial weakness following cerebral infarction: Secondary | ICD-10-CM | POA: Diagnosis not present

## 2019-04-02 DIAGNOSIS — M6281 Muscle weakness (generalized): Secondary | ICD-10-CM | POA: Diagnosis not present

## 2019-04-02 DIAGNOSIS — I69398 Other sequelae of cerebral infarction: Secondary | ICD-10-CM | POA: Diagnosis not present

## 2019-04-03 DIAGNOSIS — I69354 Hemiplegia and hemiparesis following cerebral infarction affecting left non-dominant side: Secondary | ICD-10-CM | POA: Diagnosis not present

## 2019-04-03 DIAGNOSIS — I69398 Other sequelae of cerebral infarction: Secondary | ICD-10-CM | POA: Diagnosis not present

## 2019-04-03 DIAGNOSIS — I495 Sick sinus syndrome: Secondary | ICD-10-CM | POA: Diagnosis not present

## 2019-04-03 DIAGNOSIS — K219 Gastro-esophageal reflux disease without esophagitis: Secondary | ICD-10-CM | POA: Diagnosis not present

## 2019-04-03 DIAGNOSIS — Z7901 Long term (current) use of anticoagulants: Secondary | ICD-10-CM | POA: Diagnosis not present

## 2019-04-03 DIAGNOSIS — K449 Diaphragmatic hernia without obstruction or gangrene: Secondary | ICD-10-CM | POA: Diagnosis not present

## 2019-04-03 DIAGNOSIS — I10 Essential (primary) hypertension: Secondary | ICD-10-CM | POA: Diagnosis not present

## 2019-04-03 DIAGNOSIS — I69320 Aphasia following cerebral infarction: Secondary | ICD-10-CM | POA: Diagnosis not present

## 2019-04-03 DIAGNOSIS — Z950 Presence of cardiac pacemaker: Secondary | ICD-10-CM | POA: Diagnosis not present

## 2019-04-03 DIAGNOSIS — M6281 Muscle weakness (generalized): Secondary | ICD-10-CM | POA: Diagnosis not present

## 2019-04-03 DIAGNOSIS — M1990 Unspecified osteoarthritis, unspecified site: Secondary | ICD-10-CM | POA: Diagnosis not present

## 2019-04-03 DIAGNOSIS — Z87891 Personal history of nicotine dependence: Secondary | ICD-10-CM | POA: Diagnosis not present

## 2019-04-03 DIAGNOSIS — E785 Hyperlipidemia, unspecified: Secondary | ICD-10-CM | POA: Diagnosis not present

## 2019-04-03 DIAGNOSIS — I480 Paroxysmal atrial fibrillation: Secondary | ICD-10-CM | POA: Diagnosis not present

## 2019-04-04 ENCOUNTER — Other Ambulatory Visit: Payer: Self-pay

## 2019-04-07 ENCOUNTER — Other Ambulatory Visit: Payer: Self-pay

## 2019-04-07 ENCOUNTER — Encounter: Payer: Self-pay | Admitting: Family Medicine

## 2019-04-07 ENCOUNTER — Ambulatory Visit (INDEPENDENT_AMBULATORY_CARE_PROVIDER_SITE_OTHER): Payer: Medicare Other | Admitting: Family Medicine

## 2019-04-07 VITALS — BP 137/74 | HR 62 | Temp 97.0°F | Resp 16 | Ht 63.0 in | Wt 121.0 lb

## 2019-04-07 DIAGNOSIS — I10 Essential (primary) hypertension: Secondary | ICD-10-CM

## 2019-04-07 DIAGNOSIS — K219 Gastro-esophageal reflux disease without esophagitis: Secondary | ICD-10-CM | POA: Diagnosis not present

## 2019-04-07 DIAGNOSIS — Z950 Presence of cardiac pacemaker: Secondary | ICD-10-CM | POA: Diagnosis not present

## 2019-04-07 DIAGNOSIS — R209 Unspecified disturbances of skin sensation: Secondary | ICD-10-CM

## 2019-04-07 DIAGNOSIS — E785 Hyperlipidemia, unspecified: Secondary | ICD-10-CM

## 2019-04-07 DIAGNOSIS — I480 Paroxysmal atrial fibrillation: Secondary | ICD-10-CM | POA: Diagnosis not present

## 2019-04-07 DIAGNOSIS — I69354 Hemiplegia and hemiparesis following cerebral infarction affecting left non-dominant side: Secondary | ICD-10-CM

## 2019-04-07 DIAGNOSIS — I69398 Other sequelae of cerebral infarction: Secondary | ICD-10-CM

## 2019-04-07 DIAGNOSIS — I69392 Facial weakness following cerebral infarction: Secondary | ICD-10-CM

## 2019-04-07 MED ORDER — APIXABAN 2.5 MG PO TABS: 2.5000 mg | ORAL_TABLET | Freq: Two times a day (BID) | ORAL | 1 refills | Status: DC

## 2019-04-07 MED ORDER — ENALAPRIL MALEATE 5 MG PO TABS: 5.0000 mg | ORAL_TABLET | Freq: Every day | ORAL | 1 refills | Status: DC

## 2019-04-07 MED ORDER — ZINC SULFATE 220 (50 ZN) MG PO CAPS: 220.0000 mg | ORAL_CAPSULE | Freq: Every day | ORAL | 1 refills | Status: DC

## 2019-04-07 MED ORDER — OMEPRAZOLE 20 MG PO CPDR: 20.0000 mg | DELAYED_RELEASE_CAPSULE | Freq: Every day | ORAL | 1 refills | Status: DC

## 2019-04-07 MED ORDER — AMLODIPINE BESYLATE 5 MG PO TABS: 5.0000 mg | ORAL_TABLET | Freq: Every day | ORAL | 1 refills | Status: DC

## 2019-04-07 MED ORDER — FERROUS SULFATE 325 (65 FE) MG PO TABS: 325.0000 mg | ORAL_TABLET | Freq: Every day | ORAL | 1 refills | Status: DC

## 2019-04-07 MED ORDER — PRAVASTATIN SODIUM 20 MG PO TABS: 20.0000 mg | ORAL_TABLET | Freq: Every day | ORAL | 1 refills | Status: DC

## 2019-04-07 NOTE — Progress Notes (Signed)
Subjective:  Patient ID: Kelsey Thompson, female    DOB: Nov 05, 1920  Age: 85 y.o. MRN: [id number]  CC: Nursing home discharge   HPI Kelsey Thompson presents for CVA on July 16. In rehab until last week. Starting home PT for L side weakness tomorrow. Slow improvement noted.  Depression screen [facility] 2/9 04/07/2019 02/26/2019 01/08/2019  Decreased Interest 0 0 0  Down, Depressed, Hopeless 0 0 0  PHQ - 2 Score 0 0 0    History Kelsey Thompson has a past medical history of Arthritis, Bradycardia, GERD (gastroesophageal reflux disease), Hiatal hernia, Hypercholesterolemia, Hypertension, and Pacemaker.   She has a past surgical history that includes Pacemaker insertion; Tonsillectomy; Appendectomy; and Cesarean section.   Her family history includes Diabetes in her father; Heart attack in her father; Heart failure in her mother.She reports that she quit smoking about 37 years ago. Her smoking use included cigarettes. She has never used smokeless tobacco. She reports that she does not drink alcohol or use drugs.    ROS Review of Systems  Objective:  BP 137/74   Pulse 62   Temp (!) 97 F (36.1 C) (Temporal)   Resp 16   Ht '5\' 3"'$  (1.6 m)   Wt 121 lb (54.9 kg)   SpO2 97%   BMI 21.43 kg/m   BP Readings from Last 3 Encounters:  04/07/19 137/74  02/26/19 132/78  01/21/19 (!) 148/72    Wt Readings from Last 3 Encounters:  04/07/19 121 lb (54.9 kg)  02/26/19 108 lb 3.2 oz (49.1 kg)  01/16/19 120 lb (54.4 kg)     Physical Exam    Assessment & Plan:   Kelsey Thompson was seen today for nursing home discharge.  Diagnoses and all orders for this visit:  Paroxysmal atrial fibrillation ([place]) -     CBC with Differential/Platelet -     CMP14+EGFR -     Lipid panel -     TSH  Hyperlipidemia LDL goal <70 -     Lipid panel  Cardiac pacemaker in situ  Essential hypertension -     CMP14+EGFR  Gastroesophageal reflux disease, unspecified whether esophagitis present -     CBC with  Differential/Platelet  Alterations of sensations following cerebrovascular accident  Facial weakness status post cerebrovascular accident  Hemiparesis affecting left side as late effect of cerebrovascular accident (CVA) ([place])  Other orders -     apixaban (ELIQUIS) 2.5 MG TABS tablet; Take 1 tablet (2.5 mg total) by mouth 2 (two) times daily. -     omeprazole (PRILOSEC) 20 MG capsule; Take 1 capsule (20 mg total) by mouth daily. -     pravastatin (PRAVACHOL) 20 MG tablet; Take 1 tablet (20 mg total) by mouth daily at 6 PM. -     zinc sulfate 220 (50 Zn) MG capsule; Take 1 capsule (220 mg total) by mouth daily. -     ferrous sulfate 325 (65 FE) MG tablet; Take 1 tablet (325 mg total) by mouth daily with breakfast. -     enalapril (VASOTEC) 5 MG tablet; Take 1 tablet (5 mg total) by mouth daily. -     amLODipine (NORVASC) 5 MG tablet; Take 1 tablet (5 mg total) by mouth daily.       I have changed Charly P. Castelli's zinc sulfate and ferrous sulfate. I am also having her maintain her calcium-vitamin D, vitamin C, acetaminophen, apixaban, omeprazole, pravastatin, enalapril, and amLODipine.  Allergies as of 04/07/2019      Reactions  Horse-derived Products Other (See Comments)   Tetanus:Reaction unknown   Penicillins Other (See Comments)   Did it involve swelling of the face/tongue/throat, SOB, or low BP? Yes-swelling Did it involve sudden or severe rash/hives, skin peeling, or any reaction on the inside of your mouth or nose? No Did you need to seek medical attention at a hospital or doctor's office? Yes-Dr. office When did it last happen?4 years prior If all above answers are "NO", may proceed with cephalosporin use.   Tetanus Antitoxin    Unknown reaction      Medication List       Accurate as of April 07, 2019  2:27 PM. If you have any questions, ask your nurse or doctor.        acetaminophen 500 MG tablet Commonly known as: TYLENOL Take 500 mg by mouth every 6  (six) hours as needed for mild pain or moderate pain.   amLODipine 5 MG tablet Commonly known as: NORVASC Take 1 tablet (5 mg total) by mouth daily.   apixaban 2.5 MG Tabs tablet Commonly known as: ELIQUIS Take 1 tablet (2.5 mg total) by mouth 2 (two) times daily.   calcium-vitamin D 500-200 MG-UNIT tablet Commonly known as: OSCAL WITH D Take 1 tablet by mouth daily.   enalapril 5 MG tablet Commonly known as: VASOTEC Take 1 tablet (5 mg total) by mouth daily.   ferrous sulfate 325 (65 FE) MG tablet Take 1 tablet (325 mg total) by mouth daily with breakfast.   omeprazole 20 MG capsule Commonly known as: PRILOSEC Take 1 capsule (20 mg total) by mouth daily.   pravastatin 20 MG tablet Commonly known as: PRAVACHOL Take 1 tablet (20 mg total) by mouth daily at 6 PM.   vitamin C 500 MG tablet Commonly known as: ASCORBIC ACID Take 500 mg by mouth daily.   zinc sulfate 220 (50 Zn) MG capsule Take 1 capsule (220 mg total) by mouth daily.        Follow-up: Return in about 6 weeks (around 05/19/2019).  Claretta Fraise, M.D.

## 2019-04-08 DIAGNOSIS — I480 Paroxysmal atrial fibrillation: Secondary | ICD-10-CM | POA: Diagnosis not present

## 2019-04-08 DIAGNOSIS — M6281 Muscle weakness (generalized): Secondary | ICD-10-CM | POA: Diagnosis not present

## 2019-04-08 DIAGNOSIS — I69320 Aphasia following cerebral infarction: Secondary | ICD-10-CM | POA: Diagnosis not present

## 2019-04-08 DIAGNOSIS — M1990 Unspecified osteoarthritis, unspecified site: Secondary | ICD-10-CM | POA: Diagnosis not present

## 2019-04-08 DIAGNOSIS — I69354 Hemiplegia and hemiparesis following cerebral infarction affecting left non-dominant side: Secondary | ICD-10-CM | POA: Diagnosis not present

## 2019-04-08 DIAGNOSIS — I495 Sick sinus syndrome: Secondary | ICD-10-CM | POA: Diagnosis not present

## 2019-04-08 DIAGNOSIS — K449 Diaphragmatic hernia without obstruction or gangrene: Secondary | ICD-10-CM | POA: Diagnosis not present

## 2019-04-08 DIAGNOSIS — Z7901 Long term (current) use of anticoagulants: Secondary | ICD-10-CM | POA: Diagnosis not present

## 2019-04-08 DIAGNOSIS — I69398 Other sequelae of cerebral infarction: Secondary | ICD-10-CM | POA: Diagnosis not present

## 2019-04-08 DIAGNOSIS — I10 Essential (primary) hypertension: Secondary | ICD-10-CM | POA: Diagnosis not present

## 2019-04-08 DIAGNOSIS — Z87891 Personal history of nicotine dependence: Secondary | ICD-10-CM | POA: Diagnosis not present

## 2019-04-08 DIAGNOSIS — K219 Gastro-esophageal reflux disease without esophagitis: Secondary | ICD-10-CM | POA: Diagnosis not present

## 2019-04-08 DIAGNOSIS — Z950 Presence of cardiac pacemaker: Secondary | ICD-10-CM | POA: Diagnosis not present

## 2019-04-08 DIAGNOSIS — E785 Hyperlipidemia, unspecified: Secondary | ICD-10-CM | POA: Diagnosis not present

## 2019-04-08 LAB — LIPID PANEL
Chol/HDL Ratio: 1.8 ratio (ref 0.0–4.4)
Cholesterol, Total: 116 mg/dL (ref 100–199)
HDL: 65 mg/dL (ref >39)
LDL Chol Calc (NIH): 40 mg/dL (ref 0–99)
Triglycerides: 42 mg/dL (ref 0–149)
VLDL Cholesterol Cal: 11 mg/dL (ref 5–40)

## 2019-04-08 LAB — CBC WITH DIFFERENTIAL/PLATELET
Basophils Absolute: 0.1 10*3/uL (ref 0.0–0.2)
Basos: 1 %
EOS (ABSOLUTE): 0.2 10*3/uL (ref 0.0–0.4)
Eos: 3 %
Hematocrit: 34.7 % (ref 34.0–46.6)
Hemoglobin: 10.8 g/dL — ABNORMAL LOW (ref 11.1–15.9)
Immature Grans (Abs): 0 10*3/uL (ref 0.0–0.1)
Immature Granulocytes: 0 %
Lymphocytes Absolute: 1.4 10*3/uL (ref 0.7–3.1)
Lymphs: 22 %
MCH: 27.1 pg (ref 26.6–33.0)
MCHC: 31.1 g/dL — ABNORMAL LOW (ref 31.5–35.7)
MCV: 87 fL (ref 79–97)
Monocytes Absolute: 0.6 10*3/uL (ref 0.1–0.9)
Monocytes: 9 %
Neutrophils Absolute: 4.1 10*3/uL (ref 1.4–7.0)
Neutrophils: 65 %
Platelets: 360 10*3/uL (ref 150–450)
RBC: 3.98 x10E6/uL (ref 3.77–5.28)
RDW: 16.4 % — ABNORMAL HIGH (ref 11.7–15.4)
WBC: 6.3 10*3/uL (ref 3.4–10.8)

## 2019-04-08 LAB — CMP14+EGFR
ALT: 10 IU/L (ref 0–32)
AST: 12 IU/L (ref 0–40)
Albumin/Globulin Ratio: 1.2 (ref 1.2–2.2)
Albumin: 3.5 g/dL (ref 3.5–4.6)
Alkaline Phosphatase: 66 IU/L (ref 39–117)
BUN/Creatinine Ratio: 22 (ref 12–28)
BUN: 23 mg/dL (ref 10–36)
Bilirubin Total: 0.2 mg/dL (ref 0.0–1.2)
CO2: 24 mmol/L (ref 20–29)
Calcium: 9.7 mg/dL (ref 8.7–10.3)
Chloride: 101 mmol/L (ref 96–106)
Creatinine, Ser: 1.05 mg/dL — ABNORMAL HIGH (ref 0.57–1.00)
GFR calc Af Amer: 52 mL/min/{1.73_m2} — ABNORMAL LOW (ref >59)
GFR calc non Af Amer: 45 mL/min/{1.73_m2} — ABNORMAL LOW (ref >59)
Globulin, Total: 3 g/dL (ref 1.5–4.5)
Glucose: 120 mg/dL — ABNORMAL HIGH (ref 65–99)
Potassium: 4.1 mmol/L (ref 3.5–5.2)
Sodium: 139 mmol/L (ref 134–144)
Total Protein: 6.5 g/dL (ref 6.0–8.5)

## 2019-04-08 LAB — TSH: TSH: 1.91 u[IU]/mL (ref 0.450–4.500)

## 2019-04-08 NOTE — Progress Notes (Signed)
Hello Tyiana,  Your lab result is normal and/or stable.Some minor variations that are not significant are commonly marked abnormal, but do not represent any medical problem for you.  Best regards, Claretta Fraise, M.D.

## 2019-04-09 ENCOUNTER — Telehealth: Payer: Self-pay | Admitting: Adult Health

## 2019-04-09 DIAGNOSIS — K219 Gastro-esophageal reflux disease without esophagitis: Secondary | ICD-10-CM | POA: Diagnosis not present

## 2019-04-09 DIAGNOSIS — I69398 Other sequelae of cerebral infarction: Secondary | ICD-10-CM | POA: Diagnosis not present

## 2019-04-09 DIAGNOSIS — M1990 Unspecified osteoarthritis, unspecified site: Secondary | ICD-10-CM | POA: Diagnosis not present

## 2019-04-09 DIAGNOSIS — M6281 Muscle weakness (generalized): Secondary | ICD-10-CM | POA: Diagnosis not present

## 2019-04-09 DIAGNOSIS — E785 Hyperlipidemia, unspecified: Secondary | ICD-10-CM | POA: Diagnosis not present

## 2019-04-09 DIAGNOSIS — I495 Sick sinus syndrome: Secondary | ICD-10-CM | POA: Diagnosis not present

## 2019-04-09 DIAGNOSIS — I10 Essential (primary) hypertension: Secondary | ICD-10-CM | POA: Diagnosis not present

## 2019-04-09 DIAGNOSIS — I69354 Hemiplegia and hemiparesis following cerebral infarction affecting left non-dominant side: Secondary | ICD-10-CM | POA: Diagnosis not present

## 2019-04-09 DIAGNOSIS — Z950 Presence of cardiac pacemaker: Secondary | ICD-10-CM | POA: Diagnosis not present

## 2019-04-09 DIAGNOSIS — I480 Paroxysmal atrial fibrillation: Secondary | ICD-10-CM | POA: Diagnosis not present

## 2019-04-09 DIAGNOSIS — Z87891 Personal history of nicotine dependence: Secondary | ICD-10-CM | POA: Diagnosis not present

## 2019-04-09 DIAGNOSIS — K449 Diaphragmatic hernia without obstruction or gangrene: Secondary | ICD-10-CM | POA: Diagnosis not present

## 2019-04-09 DIAGNOSIS — I69320 Aphasia following cerebral infarction: Secondary | ICD-10-CM | POA: Diagnosis not present

## 2019-04-09 DIAGNOSIS — Z7901 Long term (current) use of anticoagulants: Secondary | ICD-10-CM | POA: Diagnosis not present

## 2019-04-09 NOTE — Telephone Encounter (Signed)
[name] from [facility] called and stated she would like to request a Verbal Order for PT 2 week 5 and 1 week 2 starting 04/13/2019  CB# [phone]

## 2019-04-09 NOTE — Telephone Encounter (Signed)
I called [name] with [facility].  I relayed that pt last seen with [place] 02-26-19.  Was at [facility] and getting PT/OT/ST.  Recently to home and seen pcp, Dr. Livia Snellen.  She will check with them relating to orders, since seen 04/07/19.  Will require 30day F2F if needs from [place].  She will call back if needed.

## 2019-04-09 NOTE — Telephone Encounter (Signed)
Sharyn Lull from [facility] calling in requesting orders for OT for 2 week 4 and 1 week 2  CB# [phone]

## 2019-04-10 DIAGNOSIS — Z7901 Long term (current) use of anticoagulants: Secondary | ICD-10-CM | POA: Diagnosis not present

## 2019-04-10 DIAGNOSIS — M6281 Muscle weakness (generalized): Secondary | ICD-10-CM | POA: Diagnosis not present

## 2019-04-10 DIAGNOSIS — K449 Diaphragmatic hernia without obstruction or gangrene: Secondary | ICD-10-CM | POA: Diagnosis not present

## 2019-04-10 DIAGNOSIS — Z950 Presence of cardiac pacemaker: Secondary | ICD-10-CM | POA: Diagnosis not present

## 2019-04-10 DIAGNOSIS — I10 Essential (primary) hypertension: Secondary | ICD-10-CM | POA: Diagnosis not present

## 2019-04-10 DIAGNOSIS — K219 Gastro-esophageal reflux disease without esophagitis: Secondary | ICD-10-CM | POA: Diagnosis not present

## 2019-04-10 DIAGNOSIS — E785 Hyperlipidemia, unspecified: Secondary | ICD-10-CM | POA: Diagnosis not present

## 2019-04-10 DIAGNOSIS — I495 Sick sinus syndrome: Secondary | ICD-10-CM | POA: Diagnosis not present

## 2019-04-10 DIAGNOSIS — I480 Paroxysmal atrial fibrillation: Secondary | ICD-10-CM | POA: Diagnosis not present

## 2019-04-10 DIAGNOSIS — Z87891 Personal history of nicotine dependence: Secondary | ICD-10-CM | POA: Diagnosis not present

## 2019-04-10 DIAGNOSIS — M1990 Unspecified osteoarthritis, unspecified site: Secondary | ICD-10-CM | POA: Diagnosis not present

## 2019-04-10 DIAGNOSIS — I69320 Aphasia following cerebral infarction: Secondary | ICD-10-CM | POA: Diagnosis not present

## 2019-04-10 DIAGNOSIS — I69354 Hemiplegia and hemiparesis following cerebral infarction affecting left non-dominant side: Secondary | ICD-10-CM | POA: Diagnosis not present

## 2019-04-10 DIAGNOSIS — I69398 Other sequelae of cerebral infarction: Secondary | ICD-10-CM | POA: Diagnosis not present

## 2019-04-11 DIAGNOSIS — I480 Paroxysmal atrial fibrillation: Secondary | ICD-10-CM | POA: Diagnosis not present

## 2019-04-11 DIAGNOSIS — Z950 Presence of cardiac pacemaker: Secondary | ICD-10-CM | POA: Diagnosis not present

## 2019-04-11 DIAGNOSIS — M6281 Muscle weakness (generalized): Secondary | ICD-10-CM | POA: Diagnosis not present

## 2019-04-11 DIAGNOSIS — I495 Sick sinus syndrome: Secondary | ICD-10-CM | POA: Diagnosis not present

## 2019-04-11 DIAGNOSIS — I69354 Hemiplegia and hemiparesis following cerebral infarction affecting left non-dominant side: Secondary | ICD-10-CM | POA: Diagnosis not present

## 2019-04-11 DIAGNOSIS — K449 Diaphragmatic hernia without obstruction or gangrene: Secondary | ICD-10-CM | POA: Diagnosis not present

## 2019-04-11 DIAGNOSIS — Z7901 Long term (current) use of anticoagulants: Secondary | ICD-10-CM | POA: Diagnosis not present

## 2019-04-11 DIAGNOSIS — I10 Essential (primary) hypertension: Secondary | ICD-10-CM | POA: Diagnosis not present

## 2019-04-11 DIAGNOSIS — I69398 Other sequelae of cerebral infarction: Secondary | ICD-10-CM | POA: Diagnosis not present

## 2019-04-11 DIAGNOSIS — I69320 Aphasia following cerebral infarction: Secondary | ICD-10-CM | POA: Diagnosis not present

## 2019-04-11 DIAGNOSIS — Z87891 Personal history of nicotine dependence: Secondary | ICD-10-CM | POA: Diagnosis not present

## 2019-04-11 DIAGNOSIS — K219 Gastro-esophageal reflux disease without esophagitis: Secondary | ICD-10-CM | POA: Diagnosis not present

## 2019-04-11 DIAGNOSIS — M1990 Unspecified osteoarthritis, unspecified site: Secondary | ICD-10-CM | POA: Diagnosis not present

## 2019-04-11 DIAGNOSIS — E785 Hyperlipidemia, unspecified: Secondary | ICD-10-CM | POA: Diagnosis not present

## 2019-04-14 DIAGNOSIS — Z87891 Personal history of nicotine dependence: Secondary | ICD-10-CM | POA: Diagnosis not present

## 2019-04-14 DIAGNOSIS — Z7901 Long term (current) use of anticoagulants: Secondary | ICD-10-CM | POA: Diagnosis not present

## 2019-04-14 DIAGNOSIS — K449 Diaphragmatic hernia without obstruction or gangrene: Secondary | ICD-10-CM | POA: Diagnosis not present

## 2019-04-14 DIAGNOSIS — I480 Paroxysmal atrial fibrillation: Secondary | ICD-10-CM | POA: Diagnosis not present

## 2019-04-14 DIAGNOSIS — E785 Hyperlipidemia, unspecified: Secondary | ICD-10-CM | POA: Diagnosis not present

## 2019-04-14 DIAGNOSIS — M6281 Muscle weakness (generalized): Secondary | ICD-10-CM | POA: Diagnosis not present

## 2019-04-14 DIAGNOSIS — I10 Essential (primary) hypertension: Secondary | ICD-10-CM | POA: Diagnosis not present

## 2019-04-14 DIAGNOSIS — I495 Sick sinus syndrome: Secondary | ICD-10-CM | POA: Diagnosis not present

## 2019-04-14 DIAGNOSIS — Z950 Presence of cardiac pacemaker: Secondary | ICD-10-CM | POA: Diagnosis not present

## 2019-04-14 DIAGNOSIS — I69398 Other sequelae of cerebral infarction: Secondary | ICD-10-CM | POA: Diagnosis not present

## 2019-04-14 DIAGNOSIS — I69320 Aphasia following cerebral infarction: Secondary | ICD-10-CM | POA: Diagnosis not present

## 2019-04-14 DIAGNOSIS — K219 Gastro-esophageal reflux disease without esophagitis: Secondary | ICD-10-CM | POA: Diagnosis not present

## 2019-04-14 DIAGNOSIS — I69354 Hemiplegia and hemiparesis following cerebral infarction affecting left non-dominant side: Secondary | ICD-10-CM | POA: Diagnosis not present

## 2019-04-14 DIAGNOSIS — M1990 Unspecified osteoarthritis, unspecified site: Secondary | ICD-10-CM | POA: Diagnosis not present

## 2019-04-14 NOTE — Telephone Encounter (Signed)
Form signed and placed in outbox

## 2019-04-15 DIAGNOSIS — K449 Diaphragmatic hernia without obstruction or gangrene: Secondary | ICD-10-CM | POA: Diagnosis not present

## 2019-04-15 DIAGNOSIS — M6281 Muscle weakness (generalized): Secondary | ICD-10-CM | POA: Diagnosis not present

## 2019-04-15 DIAGNOSIS — I69398 Other sequelae of cerebral infarction: Secondary | ICD-10-CM | POA: Diagnosis not present

## 2019-04-15 DIAGNOSIS — Z7901 Long term (current) use of anticoagulants: Secondary | ICD-10-CM | POA: Diagnosis not present

## 2019-04-15 DIAGNOSIS — K219 Gastro-esophageal reflux disease without esophagitis: Secondary | ICD-10-CM | POA: Diagnosis not present

## 2019-04-15 DIAGNOSIS — E785 Hyperlipidemia, unspecified: Secondary | ICD-10-CM | POA: Diagnosis not present

## 2019-04-15 DIAGNOSIS — I495 Sick sinus syndrome: Secondary | ICD-10-CM | POA: Diagnosis not present

## 2019-04-15 DIAGNOSIS — I69320 Aphasia following cerebral infarction: Secondary | ICD-10-CM | POA: Diagnosis not present

## 2019-04-15 DIAGNOSIS — I10 Essential (primary) hypertension: Secondary | ICD-10-CM | POA: Diagnosis not present

## 2019-04-15 DIAGNOSIS — I69354 Hemiplegia and hemiparesis following cerebral infarction affecting left non-dominant side: Secondary | ICD-10-CM | POA: Diagnosis not present

## 2019-04-15 DIAGNOSIS — Z950 Presence of cardiac pacemaker: Secondary | ICD-10-CM | POA: Diagnosis not present

## 2019-04-15 DIAGNOSIS — M1990 Unspecified osteoarthritis, unspecified site: Secondary | ICD-10-CM | POA: Diagnosis not present

## 2019-04-15 DIAGNOSIS — Z87891 Personal history of nicotine dependence: Secondary | ICD-10-CM | POA: Diagnosis not present

## 2019-04-15 DIAGNOSIS — I480 Paroxysmal atrial fibrillation: Secondary | ICD-10-CM | POA: Diagnosis not present

## 2019-04-15 NOTE — Telephone Encounter (Signed)
Orders for [facility] with [facility] faxed back.

## 2019-04-16 DIAGNOSIS — M6281 Muscle weakness (generalized): Secondary | ICD-10-CM | POA: Diagnosis not present

## 2019-04-16 DIAGNOSIS — I495 Sick sinus syndrome: Secondary | ICD-10-CM | POA: Diagnosis not present

## 2019-04-16 DIAGNOSIS — K449 Diaphragmatic hernia without obstruction or gangrene: Secondary | ICD-10-CM | POA: Diagnosis not present

## 2019-04-16 DIAGNOSIS — E785 Hyperlipidemia, unspecified: Secondary | ICD-10-CM | POA: Diagnosis not present

## 2019-04-16 DIAGNOSIS — Z87891 Personal history of nicotine dependence: Secondary | ICD-10-CM | POA: Diagnosis not present

## 2019-04-16 DIAGNOSIS — I10 Essential (primary) hypertension: Secondary | ICD-10-CM | POA: Diagnosis not present

## 2019-04-16 DIAGNOSIS — M1990 Unspecified osteoarthritis, unspecified site: Secondary | ICD-10-CM | POA: Diagnosis not present

## 2019-04-16 DIAGNOSIS — Z950 Presence of cardiac pacemaker: Secondary | ICD-10-CM | POA: Diagnosis not present

## 2019-04-16 DIAGNOSIS — I69354 Hemiplegia and hemiparesis following cerebral infarction affecting left non-dominant side: Secondary | ICD-10-CM | POA: Diagnosis not present

## 2019-04-16 DIAGNOSIS — I480 Paroxysmal atrial fibrillation: Secondary | ICD-10-CM | POA: Diagnosis not present

## 2019-04-16 DIAGNOSIS — I69398 Other sequelae of cerebral infarction: Secondary | ICD-10-CM | POA: Diagnosis not present

## 2019-04-16 DIAGNOSIS — I69320 Aphasia following cerebral infarction: Secondary | ICD-10-CM | POA: Diagnosis not present

## 2019-04-16 DIAGNOSIS — Z7901 Long term (current) use of anticoagulants: Secondary | ICD-10-CM | POA: Diagnosis not present

## 2019-04-16 DIAGNOSIS — K219 Gastro-esophageal reflux disease without esophagitis: Secondary | ICD-10-CM | POA: Diagnosis not present

## 2019-04-17 DIAGNOSIS — I480 Paroxysmal atrial fibrillation: Secondary | ICD-10-CM | POA: Diagnosis not present

## 2019-04-17 DIAGNOSIS — I69320 Aphasia following cerebral infarction: Secondary | ICD-10-CM | POA: Diagnosis not present

## 2019-04-17 DIAGNOSIS — Z7901 Long term (current) use of anticoagulants: Secondary | ICD-10-CM | POA: Diagnosis not present

## 2019-04-17 DIAGNOSIS — I10 Essential (primary) hypertension: Secondary | ICD-10-CM | POA: Diagnosis not present

## 2019-04-17 DIAGNOSIS — M1990 Unspecified osteoarthritis, unspecified site: Secondary | ICD-10-CM | POA: Diagnosis not present

## 2019-04-17 DIAGNOSIS — K449 Diaphragmatic hernia without obstruction or gangrene: Secondary | ICD-10-CM | POA: Diagnosis not present

## 2019-04-17 DIAGNOSIS — M6281 Muscle weakness (generalized): Secondary | ICD-10-CM | POA: Diagnosis not present

## 2019-04-17 DIAGNOSIS — Z950 Presence of cardiac pacemaker: Secondary | ICD-10-CM | POA: Diagnosis not present

## 2019-04-17 DIAGNOSIS — E785 Hyperlipidemia, unspecified: Secondary | ICD-10-CM | POA: Diagnosis not present

## 2019-04-17 DIAGNOSIS — Z87891 Personal history of nicotine dependence: Secondary | ICD-10-CM | POA: Diagnosis not present

## 2019-04-17 DIAGNOSIS — I495 Sick sinus syndrome: Secondary | ICD-10-CM | POA: Diagnosis not present

## 2019-04-17 DIAGNOSIS — I69354 Hemiplegia and hemiparesis following cerebral infarction affecting left non-dominant side: Secondary | ICD-10-CM | POA: Diagnosis not present

## 2019-04-17 DIAGNOSIS — K219 Gastro-esophageal reflux disease without esophagitis: Secondary | ICD-10-CM | POA: Diagnosis not present

## 2019-04-17 DIAGNOSIS — I69398 Other sequelae of cerebral infarction: Secondary | ICD-10-CM | POA: Diagnosis not present

## 2019-04-21 DIAGNOSIS — Z87891 Personal history of nicotine dependence: Secondary | ICD-10-CM | POA: Diagnosis not present

## 2019-04-21 DIAGNOSIS — I495 Sick sinus syndrome: Secondary | ICD-10-CM | POA: Diagnosis not present

## 2019-04-21 DIAGNOSIS — M6281 Muscle weakness (generalized): Secondary | ICD-10-CM | POA: Diagnosis not present

## 2019-04-21 DIAGNOSIS — M1990 Unspecified osteoarthritis, unspecified site: Secondary | ICD-10-CM | POA: Diagnosis not present

## 2019-04-21 DIAGNOSIS — I480 Paroxysmal atrial fibrillation: Secondary | ICD-10-CM | POA: Diagnosis not present

## 2019-04-21 DIAGNOSIS — I69398 Other sequelae of cerebral infarction: Secondary | ICD-10-CM | POA: Diagnosis not present

## 2019-04-21 DIAGNOSIS — K449 Diaphragmatic hernia without obstruction or gangrene: Secondary | ICD-10-CM | POA: Diagnosis not present

## 2019-04-21 DIAGNOSIS — K219 Gastro-esophageal reflux disease without esophagitis: Secondary | ICD-10-CM | POA: Diagnosis not present

## 2019-04-21 DIAGNOSIS — I10 Essential (primary) hypertension: Secondary | ICD-10-CM | POA: Diagnosis not present

## 2019-04-21 DIAGNOSIS — Z7901 Long term (current) use of anticoagulants: Secondary | ICD-10-CM | POA: Diagnosis not present

## 2019-04-21 DIAGNOSIS — I69354 Hemiplegia and hemiparesis following cerebral infarction affecting left non-dominant side: Secondary | ICD-10-CM | POA: Diagnosis not present

## 2019-04-21 DIAGNOSIS — Z950 Presence of cardiac pacemaker: Secondary | ICD-10-CM | POA: Diagnosis not present

## 2019-04-21 DIAGNOSIS — E785 Hyperlipidemia, unspecified: Secondary | ICD-10-CM | POA: Diagnosis not present

## 2019-04-21 DIAGNOSIS — I69320 Aphasia following cerebral infarction: Secondary | ICD-10-CM | POA: Diagnosis not present

## 2019-04-22 DIAGNOSIS — K449 Diaphragmatic hernia without obstruction or gangrene: Secondary | ICD-10-CM | POA: Diagnosis not present

## 2019-04-22 DIAGNOSIS — I69398 Other sequelae of cerebral infarction: Secondary | ICD-10-CM | POA: Diagnosis not present

## 2019-04-22 DIAGNOSIS — M1990 Unspecified osteoarthritis, unspecified site: Secondary | ICD-10-CM | POA: Diagnosis not present

## 2019-04-22 DIAGNOSIS — E785 Hyperlipidemia, unspecified: Secondary | ICD-10-CM | POA: Diagnosis not present

## 2019-04-22 DIAGNOSIS — I10 Essential (primary) hypertension: Secondary | ICD-10-CM | POA: Diagnosis not present

## 2019-04-22 DIAGNOSIS — I69320 Aphasia following cerebral infarction: Secondary | ICD-10-CM | POA: Diagnosis not present

## 2019-04-22 DIAGNOSIS — M6281 Muscle weakness (generalized): Secondary | ICD-10-CM | POA: Diagnosis not present

## 2019-04-22 DIAGNOSIS — Z7901 Long term (current) use of anticoagulants: Secondary | ICD-10-CM | POA: Diagnosis not present

## 2019-04-22 DIAGNOSIS — I495 Sick sinus syndrome: Secondary | ICD-10-CM | POA: Diagnosis not present

## 2019-04-22 DIAGNOSIS — I480 Paroxysmal atrial fibrillation: Secondary | ICD-10-CM | POA: Diagnosis not present

## 2019-04-22 DIAGNOSIS — K219 Gastro-esophageal reflux disease without esophagitis: Secondary | ICD-10-CM | POA: Diagnosis not present

## 2019-04-22 DIAGNOSIS — I69354 Hemiplegia and hemiparesis following cerebral infarction affecting left non-dominant side: Secondary | ICD-10-CM | POA: Diagnosis not present

## 2019-04-22 DIAGNOSIS — Z950 Presence of cardiac pacemaker: Secondary | ICD-10-CM | POA: Diagnosis not present

## 2019-04-22 DIAGNOSIS — Z87891 Personal history of nicotine dependence: Secondary | ICD-10-CM | POA: Diagnosis not present

## 2019-04-23 DIAGNOSIS — Z7901 Long term (current) use of anticoagulants: Secondary | ICD-10-CM | POA: Diagnosis not present

## 2019-04-23 DIAGNOSIS — I10 Essential (primary) hypertension: Secondary | ICD-10-CM | POA: Diagnosis not present

## 2019-04-23 DIAGNOSIS — Z87891 Personal history of nicotine dependence: Secondary | ICD-10-CM | POA: Diagnosis not present

## 2019-04-23 DIAGNOSIS — Z950 Presence of cardiac pacemaker: Secondary | ICD-10-CM | POA: Diagnosis not present

## 2019-04-23 DIAGNOSIS — M1990 Unspecified osteoarthritis, unspecified site: Secondary | ICD-10-CM | POA: Diagnosis not present

## 2019-04-23 DIAGNOSIS — K219 Gastro-esophageal reflux disease without esophagitis: Secondary | ICD-10-CM | POA: Diagnosis not present

## 2019-04-23 DIAGNOSIS — I495 Sick sinus syndrome: Secondary | ICD-10-CM | POA: Diagnosis not present

## 2019-04-23 DIAGNOSIS — E785 Hyperlipidemia, unspecified: Secondary | ICD-10-CM | POA: Diagnosis not present

## 2019-04-23 DIAGNOSIS — I69398 Other sequelae of cerebral infarction: Secondary | ICD-10-CM | POA: Diagnosis not present

## 2019-04-23 DIAGNOSIS — I480 Paroxysmal atrial fibrillation: Secondary | ICD-10-CM | POA: Diagnosis not present

## 2019-04-23 DIAGNOSIS — I69354 Hemiplegia and hemiparesis following cerebral infarction affecting left non-dominant side: Secondary | ICD-10-CM | POA: Diagnosis not present

## 2019-04-23 DIAGNOSIS — I69320 Aphasia following cerebral infarction: Secondary | ICD-10-CM | POA: Diagnosis not present

## 2019-04-23 DIAGNOSIS — K449 Diaphragmatic hernia without obstruction or gangrene: Secondary | ICD-10-CM | POA: Diagnosis not present

## 2019-04-23 DIAGNOSIS — M6281 Muscle weakness (generalized): Secondary | ICD-10-CM | POA: Diagnosis not present

## 2019-04-24 DIAGNOSIS — M6281 Muscle weakness (generalized): Secondary | ICD-10-CM | POA: Diagnosis not present

## 2019-04-24 DIAGNOSIS — I69398 Other sequelae of cerebral infarction: Secondary | ICD-10-CM | POA: Diagnosis not present

## 2019-04-24 DIAGNOSIS — I69354 Hemiplegia and hemiparesis following cerebral infarction affecting left non-dominant side: Secondary | ICD-10-CM | POA: Diagnosis not present

## 2019-04-24 DIAGNOSIS — I69320 Aphasia following cerebral infarction: Secondary | ICD-10-CM | POA: Diagnosis not present

## 2019-04-24 DIAGNOSIS — Z950 Presence of cardiac pacemaker: Secondary | ICD-10-CM | POA: Diagnosis not present

## 2019-04-24 DIAGNOSIS — Z87891 Personal history of nicotine dependence: Secondary | ICD-10-CM | POA: Diagnosis not present

## 2019-04-24 DIAGNOSIS — I480 Paroxysmal atrial fibrillation: Secondary | ICD-10-CM | POA: Diagnosis not present

## 2019-04-24 DIAGNOSIS — M1990 Unspecified osteoarthritis, unspecified site: Secondary | ICD-10-CM | POA: Diagnosis not present

## 2019-04-24 DIAGNOSIS — K449 Diaphragmatic hernia without obstruction or gangrene: Secondary | ICD-10-CM | POA: Diagnosis not present

## 2019-04-24 DIAGNOSIS — E785 Hyperlipidemia, unspecified: Secondary | ICD-10-CM | POA: Diagnosis not present

## 2019-04-24 DIAGNOSIS — I10 Essential (primary) hypertension: Secondary | ICD-10-CM | POA: Diagnosis not present

## 2019-04-24 DIAGNOSIS — K219 Gastro-esophageal reflux disease without esophagitis: Secondary | ICD-10-CM | POA: Diagnosis not present

## 2019-04-24 DIAGNOSIS — Z7901 Long term (current) use of anticoagulants: Secondary | ICD-10-CM | POA: Diagnosis not present

## 2019-04-24 DIAGNOSIS — I495 Sick sinus syndrome: Secondary | ICD-10-CM | POA: Diagnosis not present

## 2019-04-24 NOTE — Telephone Encounter (Signed)
Kelsey Thompson from [facility] called stating that they are needing OV notes from date 02/26/19. Please fax attn Kelsey Thompson [phone]

## 2019-04-28 ENCOUNTER — Telehealth: Payer: Self-pay | Admitting: Family Medicine

## 2019-04-28 ENCOUNTER — Telehealth: Payer: Self-pay | Admitting: *Deleted

## 2019-04-28 DIAGNOSIS — I69354 Hemiplegia and hemiparesis following cerebral infarction affecting left non-dominant side: Secondary | ICD-10-CM | POA: Diagnosis not present

## 2019-04-28 DIAGNOSIS — Z950 Presence of cardiac pacemaker: Secondary | ICD-10-CM | POA: Diagnosis not present

## 2019-04-28 DIAGNOSIS — I480 Paroxysmal atrial fibrillation: Secondary | ICD-10-CM | POA: Diagnosis not present

## 2019-04-28 DIAGNOSIS — I69320 Aphasia following cerebral infarction: Secondary | ICD-10-CM | POA: Diagnosis not present

## 2019-04-28 DIAGNOSIS — M1990 Unspecified osteoarthritis, unspecified site: Secondary | ICD-10-CM | POA: Diagnosis not present

## 2019-04-28 DIAGNOSIS — I10 Essential (primary) hypertension: Secondary | ICD-10-CM | POA: Diagnosis not present

## 2019-04-28 DIAGNOSIS — Z87891 Personal history of nicotine dependence: Secondary | ICD-10-CM | POA: Diagnosis not present

## 2019-04-28 DIAGNOSIS — I69398 Other sequelae of cerebral infarction: Secondary | ICD-10-CM | POA: Diagnosis not present

## 2019-04-28 DIAGNOSIS — Z7901 Long term (current) use of anticoagulants: Secondary | ICD-10-CM | POA: Diagnosis not present

## 2019-04-28 DIAGNOSIS — I495 Sick sinus syndrome: Secondary | ICD-10-CM | POA: Diagnosis not present

## 2019-04-28 DIAGNOSIS — K449 Diaphragmatic hernia without obstruction or gangrene: Secondary | ICD-10-CM | POA: Diagnosis not present

## 2019-04-28 DIAGNOSIS — K219 Gastro-esophageal reflux disease without esophagitis: Secondary | ICD-10-CM | POA: Diagnosis not present

## 2019-04-28 DIAGNOSIS — M6281 Muscle weakness (generalized): Secondary | ICD-10-CM | POA: Diagnosis not present

## 2019-04-28 DIAGNOSIS — E785 Hyperlipidemia, unspecified: Secondary | ICD-10-CM | POA: Diagnosis not present

## 2019-04-28 NOTE — Telephone Encounter (Signed)
Feb 26, 2019 office notes faxed as requested.

## 2019-04-28 NOTE — Telephone Encounter (Signed)
Received from [facility] form for [facility] signed by Dr. Leonie Man and then fax confirmation received.   [phone], [phone] ofv.  Form then to MR.

## 2019-04-28 NOTE — Telephone Encounter (Signed)
Patient daughter aware she is going to need a face to face appt with pcp and FL2 filled will call and let us know when to set up appointment she is going to call [facility] to see if they have a room

## 2019-04-29 DIAGNOSIS — Z7901 Long term (current) use of anticoagulants: Secondary | ICD-10-CM | POA: Diagnosis not present

## 2019-04-29 DIAGNOSIS — Z950 Presence of cardiac pacemaker: Secondary | ICD-10-CM | POA: Diagnosis not present

## 2019-04-29 DIAGNOSIS — I69354 Hemiplegia and hemiparesis following cerebral infarction affecting left non-dominant side: Secondary | ICD-10-CM | POA: Diagnosis not present

## 2019-04-29 DIAGNOSIS — I480 Paroxysmal atrial fibrillation: Secondary | ICD-10-CM | POA: Diagnosis not present

## 2019-04-29 DIAGNOSIS — Z87891 Personal history of nicotine dependence: Secondary | ICD-10-CM | POA: Diagnosis not present

## 2019-04-29 DIAGNOSIS — I495 Sick sinus syndrome: Secondary | ICD-10-CM | POA: Diagnosis not present

## 2019-04-29 DIAGNOSIS — I69398 Other sequelae of cerebral infarction: Secondary | ICD-10-CM | POA: Diagnosis not present

## 2019-04-29 DIAGNOSIS — M6281 Muscle weakness (generalized): Secondary | ICD-10-CM | POA: Diagnosis not present

## 2019-04-29 DIAGNOSIS — I10 Essential (primary) hypertension: Secondary | ICD-10-CM | POA: Diagnosis not present

## 2019-04-29 DIAGNOSIS — M1990 Unspecified osteoarthritis, unspecified site: Secondary | ICD-10-CM | POA: Diagnosis not present

## 2019-04-29 DIAGNOSIS — I69320 Aphasia following cerebral infarction: Secondary | ICD-10-CM | POA: Diagnosis not present

## 2019-04-29 DIAGNOSIS — E785 Hyperlipidemia, unspecified: Secondary | ICD-10-CM | POA: Diagnosis not present

## 2019-04-29 DIAGNOSIS — K449 Diaphragmatic hernia without obstruction or gangrene: Secondary | ICD-10-CM | POA: Diagnosis not present

## 2019-04-29 DIAGNOSIS — K219 Gastro-esophageal reflux disease without esophagitis: Secondary | ICD-10-CM | POA: Diagnosis not present

## 2019-04-30 ENCOUNTER — Other Ambulatory Visit: Payer: Self-pay

## 2019-04-30 DIAGNOSIS — I495 Sick sinus syndrome: Secondary | ICD-10-CM | POA: Diagnosis not present

## 2019-04-30 DIAGNOSIS — M1990 Unspecified osteoarthritis, unspecified site: Secondary | ICD-10-CM | POA: Diagnosis not present

## 2019-04-30 DIAGNOSIS — E785 Hyperlipidemia, unspecified: Secondary | ICD-10-CM | POA: Diagnosis not present

## 2019-04-30 DIAGNOSIS — K449 Diaphragmatic hernia without obstruction or gangrene: Secondary | ICD-10-CM | POA: Diagnosis not present

## 2019-04-30 DIAGNOSIS — Z87891 Personal history of nicotine dependence: Secondary | ICD-10-CM | POA: Diagnosis not present

## 2019-04-30 DIAGNOSIS — I480 Paroxysmal atrial fibrillation: Secondary | ICD-10-CM | POA: Diagnosis not present

## 2019-04-30 DIAGNOSIS — I69354 Hemiplegia and hemiparesis following cerebral infarction affecting left non-dominant side: Secondary | ICD-10-CM | POA: Diagnosis not present

## 2019-04-30 DIAGNOSIS — I10 Essential (primary) hypertension: Secondary | ICD-10-CM | POA: Diagnosis not present

## 2019-04-30 DIAGNOSIS — I69398 Other sequelae of cerebral infarction: Secondary | ICD-10-CM | POA: Diagnosis not present

## 2019-04-30 DIAGNOSIS — Z7901 Long term (current) use of anticoagulants: Secondary | ICD-10-CM | POA: Diagnosis not present

## 2019-04-30 DIAGNOSIS — Z950 Presence of cardiac pacemaker: Secondary | ICD-10-CM | POA: Diagnosis not present

## 2019-04-30 DIAGNOSIS — K219 Gastro-esophageal reflux disease without esophagitis: Secondary | ICD-10-CM | POA: Diagnosis not present

## 2019-04-30 DIAGNOSIS — I69320 Aphasia following cerebral infarction: Secondary | ICD-10-CM | POA: Diagnosis not present

## 2019-04-30 DIAGNOSIS — M6281 Muscle weakness (generalized): Secondary | ICD-10-CM | POA: Diagnosis not present

## 2019-04-30 NOTE — Telephone Encounter (Signed)
OT Sharyn Lull with [facility] states pt is making great progress but she feels with a little more OT she can do even better.  Sharyn Lull is asking for 2 times a week for 3 weeks starting 11-02

## 2019-04-30 NOTE — Telephone Encounter (Signed)
LVM requesting call back.

## 2019-04-30 NOTE — Telephone Encounter (Addendum)
Received call back from [place], [place] with [facility]. I advised her that Janett Billow, NP approved to continue with OT 2 times a week for 3 weeks starting 05-05-2019. Sharyn Lull  verbalized understanding, appreciation.

## 2019-04-30 NOTE — Patient Outreach (Signed)
Telephone outreach to patient to obtain mRs was successfully completed. mRs= 4. Spoke with daughter/POA [name] per DPR to obtain score.

## 2019-04-30 NOTE — Telephone Encounter (Signed)
Approved.  

## 2019-05-01 DIAGNOSIS — K449 Diaphragmatic hernia without obstruction or gangrene: Secondary | ICD-10-CM | POA: Diagnosis not present

## 2019-05-01 DIAGNOSIS — I495 Sick sinus syndrome: Secondary | ICD-10-CM | POA: Diagnosis not present

## 2019-05-01 DIAGNOSIS — I69354 Hemiplegia and hemiparesis following cerebral infarction affecting left non-dominant side: Secondary | ICD-10-CM | POA: Diagnosis not present

## 2019-05-01 DIAGNOSIS — I69398 Other sequelae of cerebral infarction: Secondary | ICD-10-CM | POA: Diagnosis not present

## 2019-05-01 DIAGNOSIS — M1990 Unspecified osteoarthritis, unspecified site: Secondary | ICD-10-CM | POA: Diagnosis not present

## 2019-05-01 DIAGNOSIS — K219 Gastro-esophageal reflux disease without esophagitis: Secondary | ICD-10-CM | POA: Diagnosis not present

## 2019-05-01 DIAGNOSIS — E785 Hyperlipidemia, unspecified: Secondary | ICD-10-CM | POA: Diagnosis not present

## 2019-05-01 DIAGNOSIS — Z87891 Personal history of nicotine dependence: Secondary | ICD-10-CM | POA: Diagnosis not present

## 2019-05-01 DIAGNOSIS — I69320 Aphasia following cerebral infarction: Secondary | ICD-10-CM | POA: Diagnosis not present

## 2019-05-01 DIAGNOSIS — Z950 Presence of cardiac pacemaker: Secondary | ICD-10-CM | POA: Diagnosis not present

## 2019-05-01 DIAGNOSIS — M6281 Muscle weakness (generalized): Secondary | ICD-10-CM | POA: Diagnosis not present

## 2019-05-01 DIAGNOSIS — I10 Essential (primary) hypertension: Secondary | ICD-10-CM | POA: Diagnosis not present

## 2019-05-01 DIAGNOSIS — Z7901 Long term (current) use of anticoagulants: Secondary | ICD-10-CM | POA: Diagnosis not present

## 2019-05-01 DIAGNOSIS — I480 Paroxysmal atrial fibrillation: Secondary | ICD-10-CM | POA: Diagnosis not present

## 2019-05-02 DIAGNOSIS — I69392 Facial weakness following cerebral infarction: Secondary | ICD-10-CM | POA: Diagnosis not present

## 2019-05-02 DIAGNOSIS — M6281 Muscle weakness (generalized): Secondary | ICD-10-CM | POA: Diagnosis not present

## 2019-05-02 DIAGNOSIS — I69398 Other sequelae of cerebral infarction: Secondary | ICD-10-CM | POA: Diagnosis not present

## 2019-05-05 DIAGNOSIS — Z87891 Personal history of nicotine dependence: Secondary | ICD-10-CM | POA: Diagnosis not present

## 2019-05-05 DIAGNOSIS — I69398 Other sequelae of cerebral infarction: Secondary | ICD-10-CM | POA: Diagnosis not present

## 2019-05-05 DIAGNOSIS — E785 Hyperlipidemia, unspecified: Secondary | ICD-10-CM | POA: Diagnosis not present

## 2019-05-05 DIAGNOSIS — I495 Sick sinus syndrome: Secondary | ICD-10-CM | POA: Diagnosis not present

## 2019-05-05 DIAGNOSIS — I69320 Aphasia following cerebral infarction: Secondary | ICD-10-CM | POA: Diagnosis not present

## 2019-05-05 DIAGNOSIS — I10 Essential (primary) hypertension: Secondary | ICD-10-CM | POA: Diagnosis not present

## 2019-05-05 DIAGNOSIS — Z7901 Long term (current) use of anticoagulants: Secondary | ICD-10-CM | POA: Diagnosis not present

## 2019-05-05 DIAGNOSIS — I69354 Hemiplegia and hemiparesis following cerebral infarction affecting left non-dominant side: Secondary | ICD-10-CM | POA: Diagnosis not present

## 2019-05-05 DIAGNOSIS — I480 Paroxysmal atrial fibrillation: Secondary | ICD-10-CM | POA: Diagnosis not present

## 2019-05-05 DIAGNOSIS — K219 Gastro-esophageal reflux disease without esophagitis: Secondary | ICD-10-CM | POA: Diagnosis not present

## 2019-05-05 DIAGNOSIS — Z950 Presence of cardiac pacemaker: Secondary | ICD-10-CM | POA: Diagnosis not present

## 2019-05-05 DIAGNOSIS — K449 Diaphragmatic hernia without obstruction or gangrene: Secondary | ICD-10-CM | POA: Diagnosis not present

## 2019-05-05 DIAGNOSIS — M1990 Unspecified osteoarthritis, unspecified site: Secondary | ICD-10-CM | POA: Diagnosis not present

## 2019-05-05 DIAGNOSIS — M6281 Muscle weakness (generalized): Secondary | ICD-10-CM | POA: Diagnosis not present

## 2019-05-06 DIAGNOSIS — Z7901 Long term (current) use of anticoagulants: Secondary | ICD-10-CM | POA: Diagnosis not present

## 2019-05-06 DIAGNOSIS — I480 Paroxysmal atrial fibrillation: Secondary | ICD-10-CM | POA: Diagnosis not present

## 2019-05-06 DIAGNOSIS — I69398 Other sequelae of cerebral infarction: Secondary | ICD-10-CM | POA: Diagnosis not present

## 2019-05-06 DIAGNOSIS — Z950 Presence of cardiac pacemaker: Secondary | ICD-10-CM | POA: Diagnosis not present

## 2019-05-06 DIAGNOSIS — E785 Hyperlipidemia, unspecified: Secondary | ICD-10-CM | POA: Diagnosis not present

## 2019-05-06 DIAGNOSIS — I69354 Hemiplegia and hemiparesis following cerebral infarction affecting left non-dominant side: Secondary | ICD-10-CM | POA: Diagnosis not present

## 2019-05-06 DIAGNOSIS — Z87891 Personal history of nicotine dependence: Secondary | ICD-10-CM | POA: Diagnosis not present

## 2019-05-06 DIAGNOSIS — K449 Diaphragmatic hernia without obstruction or gangrene: Secondary | ICD-10-CM | POA: Diagnosis not present

## 2019-05-06 DIAGNOSIS — I69320 Aphasia following cerebral infarction: Secondary | ICD-10-CM | POA: Diagnosis not present

## 2019-05-06 DIAGNOSIS — I495 Sick sinus syndrome: Secondary | ICD-10-CM | POA: Diagnosis not present

## 2019-05-06 DIAGNOSIS — I10 Essential (primary) hypertension: Secondary | ICD-10-CM | POA: Diagnosis not present

## 2019-05-06 DIAGNOSIS — M1990 Unspecified osteoarthritis, unspecified site: Secondary | ICD-10-CM | POA: Diagnosis not present

## 2019-05-06 DIAGNOSIS — M6281 Muscle weakness (generalized): Secondary | ICD-10-CM | POA: Diagnosis not present

## 2019-05-06 DIAGNOSIS — K219 Gastro-esophageal reflux disease without esophagitis: Secondary | ICD-10-CM | POA: Diagnosis not present

## 2019-05-07 DIAGNOSIS — M1990 Unspecified osteoarthritis, unspecified site: Secondary | ICD-10-CM | POA: Diagnosis not present

## 2019-05-07 DIAGNOSIS — Z7901 Long term (current) use of anticoagulants: Secondary | ICD-10-CM | POA: Diagnosis not present

## 2019-05-07 DIAGNOSIS — Z950 Presence of cardiac pacemaker: Secondary | ICD-10-CM | POA: Diagnosis not present

## 2019-05-07 DIAGNOSIS — Z87891 Personal history of nicotine dependence: Secondary | ICD-10-CM | POA: Diagnosis not present

## 2019-05-07 DIAGNOSIS — K219 Gastro-esophageal reflux disease without esophagitis: Secondary | ICD-10-CM | POA: Diagnosis not present

## 2019-05-07 DIAGNOSIS — M6281 Muscle weakness (generalized): Secondary | ICD-10-CM | POA: Diagnosis not present

## 2019-05-07 DIAGNOSIS — I480 Paroxysmal atrial fibrillation: Secondary | ICD-10-CM | POA: Diagnosis not present

## 2019-05-07 DIAGNOSIS — I69320 Aphasia following cerebral infarction: Secondary | ICD-10-CM | POA: Diagnosis not present

## 2019-05-07 DIAGNOSIS — I69354 Hemiplegia and hemiparesis following cerebral infarction affecting left non-dominant side: Secondary | ICD-10-CM | POA: Diagnosis not present

## 2019-05-07 DIAGNOSIS — I10 Essential (primary) hypertension: Secondary | ICD-10-CM | POA: Diagnosis not present

## 2019-05-07 DIAGNOSIS — I495 Sick sinus syndrome: Secondary | ICD-10-CM | POA: Diagnosis not present

## 2019-05-07 DIAGNOSIS — K449 Diaphragmatic hernia without obstruction or gangrene: Secondary | ICD-10-CM | POA: Diagnosis not present

## 2019-05-07 DIAGNOSIS — E785 Hyperlipidemia, unspecified: Secondary | ICD-10-CM | POA: Diagnosis not present

## 2019-05-07 DIAGNOSIS — I69398 Other sequelae of cerebral infarction: Secondary | ICD-10-CM | POA: Diagnosis not present

## 2019-05-09 DIAGNOSIS — I495 Sick sinus syndrome: Secondary | ICD-10-CM | POA: Diagnosis not present

## 2019-05-09 DIAGNOSIS — Z950 Presence of cardiac pacemaker: Secondary | ICD-10-CM | POA: Diagnosis not present

## 2019-05-09 DIAGNOSIS — I10 Essential (primary) hypertension: Secondary | ICD-10-CM | POA: Diagnosis not present

## 2019-05-09 DIAGNOSIS — E785 Hyperlipidemia, unspecified: Secondary | ICD-10-CM | POA: Diagnosis not present

## 2019-05-09 DIAGNOSIS — Z7901 Long term (current) use of anticoagulants: Secondary | ICD-10-CM | POA: Diagnosis not present

## 2019-05-09 DIAGNOSIS — K219 Gastro-esophageal reflux disease without esophagitis: Secondary | ICD-10-CM | POA: Diagnosis not present

## 2019-05-09 DIAGNOSIS — I69320 Aphasia following cerebral infarction: Secondary | ICD-10-CM | POA: Diagnosis not present

## 2019-05-09 DIAGNOSIS — I69398 Other sequelae of cerebral infarction: Secondary | ICD-10-CM | POA: Diagnosis not present

## 2019-05-09 DIAGNOSIS — Z87891 Personal history of nicotine dependence: Secondary | ICD-10-CM | POA: Diagnosis not present

## 2019-05-09 DIAGNOSIS — M6281 Muscle weakness (generalized): Secondary | ICD-10-CM | POA: Diagnosis not present

## 2019-05-09 DIAGNOSIS — M1990 Unspecified osteoarthritis, unspecified site: Secondary | ICD-10-CM | POA: Diagnosis not present

## 2019-05-09 DIAGNOSIS — I480 Paroxysmal atrial fibrillation: Secondary | ICD-10-CM | POA: Diagnosis not present

## 2019-05-09 DIAGNOSIS — I69354 Hemiplegia and hemiparesis following cerebral infarction affecting left non-dominant side: Secondary | ICD-10-CM | POA: Diagnosis not present

## 2019-05-09 DIAGNOSIS — K449 Diaphragmatic hernia without obstruction or gangrene: Secondary | ICD-10-CM | POA: Diagnosis not present

## 2019-05-12 DIAGNOSIS — I69398 Other sequelae of cerebral infarction: Secondary | ICD-10-CM | POA: Diagnosis not present

## 2019-05-12 DIAGNOSIS — K449 Diaphragmatic hernia without obstruction or gangrene: Secondary | ICD-10-CM | POA: Diagnosis not present

## 2019-05-12 DIAGNOSIS — M1990 Unspecified osteoarthritis, unspecified site: Secondary | ICD-10-CM | POA: Diagnosis not present

## 2019-05-12 DIAGNOSIS — I480 Paroxysmal atrial fibrillation: Secondary | ICD-10-CM | POA: Diagnosis not present

## 2019-05-12 DIAGNOSIS — I10 Essential (primary) hypertension: Secondary | ICD-10-CM | POA: Diagnosis not present

## 2019-05-12 DIAGNOSIS — Z87891 Personal history of nicotine dependence: Secondary | ICD-10-CM | POA: Diagnosis not present

## 2019-05-12 DIAGNOSIS — I69320 Aphasia following cerebral infarction: Secondary | ICD-10-CM | POA: Diagnosis not present

## 2019-05-12 DIAGNOSIS — M6281 Muscle weakness (generalized): Secondary | ICD-10-CM | POA: Diagnosis not present

## 2019-05-12 DIAGNOSIS — E785 Hyperlipidemia, unspecified: Secondary | ICD-10-CM | POA: Diagnosis not present

## 2019-05-12 DIAGNOSIS — K219 Gastro-esophageal reflux disease without esophagitis: Secondary | ICD-10-CM | POA: Diagnosis not present

## 2019-05-12 DIAGNOSIS — Z950 Presence of cardiac pacemaker: Secondary | ICD-10-CM | POA: Diagnosis not present

## 2019-05-12 DIAGNOSIS — I69354 Hemiplegia and hemiparesis following cerebral infarction affecting left non-dominant side: Secondary | ICD-10-CM | POA: Diagnosis not present

## 2019-05-12 DIAGNOSIS — Z7901 Long term (current) use of anticoagulants: Secondary | ICD-10-CM | POA: Diagnosis not present

## 2019-05-12 DIAGNOSIS — I495 Sick sinus syndrome: Secondary | ICD-10-CM | POA: Diagnosis not present

## 2019-05-14 DIAGNOSIS — K449 Diaphragmatic hernia without obstruction or gangrene: Secondary | ICD-10-CM | POA: Diagnosis not present

## 2019-05-14 DIAGNOSIS — I480 Paroxysmal atrial fibrillation: Secondary | ICD-10-CM | POA: Diagnosis not present

## 2019-05-14 DIAGNOSIS — I69320 Aphasia following cerebral infarction: Secondary | ICD-10-CM | POA: Diagnosis not present

## 2019-05-14 DIAGNOSIS — I495 Sick sinus syndrome: Secondary | ICD-10-CM | POA: Diagnosis not present

## 2019-05-14 DIAGNOSIS — I10 Essential (primary) hypertension: Secondary | ICD-10-CM | POA: Diagnosis not present

## 2019-05-14 DIAGNOSIS — M1990 Unspecified osteoarthritis, unspecified site: Secondary | ICD-10-CM | POA: Diagnosis not present

## 2019-05-14 DIAGNOSIS — I69398 Other sequelae of cerebral infarction: Secondary | ICD-10-CM | POA: Diagnosis not present

## 2019-05-14 DIAGNOSIS — I69354 Hemiplegia and hemiparesis following cerebral infarction affecting left non-dominant side: Secondary | ICD-10-CM | POA: Diagnosis not present

## 2019-05-14 DIAGNOSIS — E785 Hyperlipidemia, unspecified: Secondary | ICD-10-CM | POA: Diagnosis not present

## 2019-05-14 DIAGNOSIS — Z87891 Personal history of nicotine dependence: Secondary | ICD-10-CM | POA: Diagnosis not present

## 2019-05-14 DIAGNOSIS — Z950 Presence of cardiac pacemaker: Secondary | ICD-10-CM | POA: Diagnosis not present

## 2019-05-14 DIAGNOSIS — Z7901 Long term (current) use of anticoagulants: Secondary | ICD-10-CM | POA: Diagnosis not present

## 2019-05-14 DIAGNOSIS — M6281 Muscle weakness (generalized): Secondary | ICD-10-CM | POA: Diagnosis not present

## 2019-05-14 DIAGNOSIS — K219 Gastro-esophageal reflux disease without esophagitis: Secondary | ICD-10-CM | POA: Diagnosis not present

## 2019-05-15 DIAGNOSIS — M1990 Unspecified osteoarthritis, unspecified site: Secondary | ICD-10-CM | POA: Diagnosis not present

## 2019-05-15 DIAGNOSIS — I69354 Hemiplegia and hemiparesis following cerebral infarction affecting left non-dominant side: Secondary | ICD-10-CM | POA: Diagnosis not present

## 2019-05-15 DIAGNOSIS — M6281 Muscle weakness (generalized): Secondary | ICD-10-CM | POA: Diagnosis not present

## 2019-05-15 DIAGNOSIS — I495 Sick sinus syndrome: Secondary | ICD-10-CM | POA: Diagnosis not present

## 2019-05-15 DIAGNOSIS — Z87891 Personal history of nicotine dependence: Secondary | ICD-10-CM | POA: Diagnosis not present

## 2019-05-15 DIAGNOSIS — Z7901 Long term (current) use of anticoagulants: Secondary | ICD-10-CM | POA: Diagnosis not present

## 2019-05-15 DIAGNOSIS — E785 Hyperlipidemia, unspecified: Secondary | ICD-10-CM | POA: Diagnosis not present

## 2019-05-15 DIAGNOSIS — K449 Diaphragmatic hernia without obstruction or gangrene: Secondary | ICD-10-CM | POA: Diagnosis not present

## 2019-05-15 DIAGNOSIS — I480 Paroxysmal atrial fibrillation: Secondary | ICD-10-CM | POA: Diagnosis not present

## 2019-05-15 DIAGNOSIS — I10 Essential (primary) hypertension: Secondary | ICD-10-CM | POA: Diagnosis not present

## 2019-05-15 DIAGNOSIS — Z950 Presence of cardiac pacemaker: Secondary | ICD-10-CM | POA: Diagnosis not present

## 2019-05-15 DIAGNOSIS — I69398 Other sequelae of cerebral infarction: Secondary | ICD-10-CM | POA: Diagnosis not present

## 2019-05-15 DIAGNOSIS — K219 Gastro-esophageal reflux disease without esophagitis: Secondary | ICD-10-CM | POA: Diagnosis not present

## 2019-05-15 DIAGNOSIS — I69320 Aphasia following cerebral infarction: Secondary | ICD-10-CM | POA: Diagnosis not present

## 2019-05-19 ENCOUNTER — Ambulatory Visit (INDEPENDENT_AMBULATORY_CARE_PROVIDER_SITE_OTHER): Payer: Medicare Other | Admitting: Family Medicine

## 2019-05-19 ENCOUNTER — Other Ambulatory Visit: Payer: Self-pay

## 2019-05-19 ENCOUNTER — Encounter: Payer: Self-pay | Admitting: Family Medicine

## 2019-05-19 VITALS — BP 118/70 | HR 65 | Temp 97.8°F | Resp 20 | Ht 63.0 in | Wt 121.0 lb

## 2019-05-19 DIAGNOSIS — K219 Gastro-esophageal reflux disease without esophagitis: Secondary | ICD-10-CM | POA: Diagnosis not present

## 2019-05-19 DIAGNOSIS — I495 Sick sinus syndrome: Secondary | ICD-10-CM | POA: Diagnosis not present

## 2019-05-19 DIAGNOSIS — K449 Diaphragmatic hernia without obstruction or gangrene: Secondary | ICD-10-CM | POA: Diagnosis not present

## 2019-05-19 DIAGNOSIS — I69354 Hemiplegia and hemiparesis following cerebral infarction affecting left non-dominant side: Secondary | ICD-10-CM | POA: Diagnosis not present

## 2019-05-19 DIAGNOSIS — M6281 Muscle weakness (generalized): Secondary | ICD-10-CM | POA: Diagnosis not present

## 2019-05-19 DIAGNOSIS — I10 Essential (primary) hypertension: Secondary | ICD-10-CM | POA: Diagnosis not present

## 2019-05-19 DIAGNOSIS — F02818 Dementia in other diseases classified elsewhere, unspecified severity, with other behavioral disturbance: Secondary | ICD-10-CM

## 2019-05-19 DIAGNOSIS — G301 Alzheimer's disease with late onset: Secondary | ICD-10-CM

## 2019-05-19 DIAGNOSIS — I69320 Aphasia following cerebral infarction: Secondary | ICD-10-CM | POA: Diagnosis not present

## 2019-05-19 DIAGNOSIS — Z950 Presence of cardiac pacemaker: Secondary | ICD-10-CM

## 2019-05-19 DIAGNOSIS — Z111 Encounter for screening for respiratory tuberculosis: Secondary | ICD-10-CM

## 2019-05-19 DIAGNOSIS — F0281 Dementia in other diseases classified elsewhere with behavioral disturbance: Secondary | ICD-10-CM

## 2019-05-19 DIAGNOSIS — I480 Paroxysmal atrial fibrillation: Secondary | ICD-10-CM | POA: Diagnosis not present

## 2019-05-19 DIAGNOSIS — I69398 Other sequelae of cerebral infarction: Secondary | ICD-10-CM | POA: Diagnosis not present

## 2019-05-19 DIAGNOSIS — M1990 Unspecified osteoarthritis, unspecified site: Secondary | ICD-10-CM | POA: Diagnosis not present

## 2019-05-19 DIAGNOSIS — Z87891 Personal history of nicotine dependence: Secondary | ICD-10-CM | POA: Diagnosis not present

## 2019-05-19 DIAGNOSIS — Z7901 Long term (current) use of anticoagulants: Secondary | ICD-10-CM | POA: Diagnosis not present

## 2019-05-19 DIAGNOSIS — E785 Hyperlipidemia, unspecified: Secondary | ICD-10-CM | POA: Diagnosis not present

## 2019-05-19 MED ORDER — DONEPEZIL HCL 5 MG PO TABS: 5.0000 mg | ORAL_TABLET | Freq: Every day | ORAL | 1 refills | Status: DC

## 2019-05-19 MED ORDER — QUETIAPINE FUMARATE 25 MG PO TABS: 25.0000 mg | ORAL_TABLET | Freq: Every day | ORAL | 2 refills | Status: DC

## 2019-05-19 MED ORDER — TRIAMCINOLONE ACETONIDE 0.1 % EX CREA: 1.0000 "application " | TOPICAL_CREAM | Freq: Three times a day (TID) | CUTANEOUS | 0 refills | Status: DC

## 2019-05-19 NOTE — Progress Notes (Signed)
Subjective:  Patient ID: Kelsey Thompson, female    DOB: [birth year]  Age: 85 y.o. MRN: [id number]  CC: forms for [facility] for [facility]   HPI Kelsey Thompson presents for returning to nursing facility. Daughter brings her in today for Face-to-face evaluation Written notes brought in by daughter pointing out that pt is forgetful, repeats herself frequently. She can't remember the day. She can't remember when or what she has eaten. She doesn't remember things she has said to other people. She doesn't remember things her daughter has told her multiple times.Can not stop talking. While in bed she was having a conversation with someone (when there was no one there.) She says the devil is talking in her ears. She accuses her daughter of stealing from her. Yelling one moment and crying the next.   Daughter is concerned that she fell out of bed on Nov. 14. This was in spite of bed rails being up.   She complains to her daughter that she has a lot of knee pain. Marland Kitchendaughter offers her Tylenol, but she won't take it. When someone else asks how she is doing she is "fine."   Every day around 5 PM she gets very agitated. Lasts for over an hour. Screams for her grandson, but remembers nothing of it the next day.Yells mmultiple times a night to go to the restroom.  She has a pacemaker. She denies, and daughter confirms that shehas not complained of chest pain, palpitations or syncope.    presents for  follow-up of hypertension. Patient has no history of headache chest pain or shortness of breath or recent cough. Patient also denies symptoms of TIA such as focal numbness or weakness. Patient denies side effects from medication. States taking it regularly.    Depression screen [facility] 2/9 05/19/2019 04/07/2019 02/26/2019  Decreased Interest 0 0 0  Down, Depressed, Hopeless 1 0 0  PHQ - 2 Score 1 0 0    History Kelsey Thompson has a past medical history of Arthritis, Bradycardia, GERD (gastroesophageal reflux disease),  Hiatal hernia, Hypercholesterolemia, Hypertension, Pacemaker, and Stroke (Carlos).   She has a past surgical history that includes Pacemaker insertion; Tonsillectomy; Appendectomy; and Cesarean section.   Her family history includes Diabetes in her father; Heart attack in her father; Heart failure in her mother.She reports that she quit smoking about 37 years ago. Her smoking use included cigarettes. She has never used smokeless tobacco. She reports that she does not drink alcohol or use drugs.    ROS Review of Systems  Constitutional: Negative.   HENT: Negative for congestion.   Eyes: Negative for visual disturbance.  Respiratory: Negative for shortness of breath.   Cardiovascular: Negative for chest pain.  Gastrointestinal: Negative for abdominal pain, constipation, diarrhea, nausea and vomiting.  Genitourinary: Negative for difficulty urinating.  Musculoskeletal: Negative for arthralgias and myalgias.  Neurological: Negative for headaches.  Psychiatric/Behavioral: Negative for sleep disturbance.    Objective:  BP 118/70   Pulse 65   Temp 97.8 F (36.6 C)   Resp 20   Ht 5\' 3"  (1.6 m)   Wt 121 lb (54.9 kg)   SpO2 97%   BMI 21.43 kg/m   BP Readings from Last 3 Encounters:  05/19/19 118/70  04/07/19 137/74  02/26/19 132/78    Wt Readings from Last 3 Encounters:  05/19/19 121 lb (54.9 kg)  04/07/19 121 lb (54.9 kg)  02/26/19 108 lb 3.2 oz (49.1 kg)     Physical Exam Constitutional:  General: She is not in acute distress.    Appearance: She is well-developed.  HENT:     Head: Normocephalic and atraumatic.     Right Ear: External ear normal.     Left Ear: External ear normal.     Nose: Nose normal.  Eyes:     Conjunctiva/sclera: Conjunctivae normal.     Pupils: Pupils are equal, round, and reactive to light.  Neck:     Musculoskeletal: Normal range of motion and neck supple.     Thyroid: No thyromegaly.  Cardiovascular:     Rate and Rhythm: Normal rate and  regular rhythm.     Heart sounds: Normal heart sounds. No murmur.  Pulmonary:     Effort: Pulmonary effort is normal. No respiratory distress.     Breath sounds: Normal breath sounds. No wheezing or rales.  Abdominal:     General: Bowel sounds are normal. There is no distension.     Palpations: Abdomen is soft.     Tenderness: There is no abdominal tenderness.  Musculoskeletal:     Comments: Left hemiparesis noted   Lymphadenopathy:     Cervical: No cervical adenopathy.  Skin:    General: Skin is warm and dry.  Neurological:     Mental Status: She is alert.     Deep Tendon Reflexes: Reflexes are normal and symmetric.  Psychiatric:        Behavior: Behavior normal.        Thought Content: Thought content normal.        Judgment: Judgment normal.       Assessment & Plan:   Kelsey Thompson was seen today for forms for fl2 for [employer]  Diagnoses and all orders for this visit:  Essential hypertension -     CMP -     CBC  Gastroesophageal reflux disease, unspecified whether esophagitis present -     CMP -     CBC  Diaphragmatic hernia without obstruction and without gangrene -     CMP -     CBC  Cardiac pacemaker in situ -     CMP -     CBC  Sinoatrial node dysfunction (HCC) -     CMP -     CBC  Paroxysmal atrial fibrillation (HCC) -     CMP -     CBC  Hemiparesis affecting left side as late effect of cerebrovascular accident (CVA) (HCC)  Screening for tuberculosis -     TB Skin Test  Other orders -     QUEtiapine (SEROQUEL) 25 MG tablet; Take 1 tablet (25 mg total) by mouth at bedtime. -     triamcinolone cream (KENALOG) 0.1 %; Apply 1 application topically 3 (three) times daily. Avoid face and genitalia -     donepezil (ARICEPT) 5 MG tablet; Take 1 tablet (5 mg total) by mouth at bedtime.       I am having Kelsey Thompson start on QUEtiapine, triamcinolone cream, and donepezil. I am also having her maintain her calcium-vitamin D, vitamin C,  acetaminophen, apixaban, omeprazole, pravastatin, zinc sulfate, ferrous sulfate, enalapril, and amLODipine.  Allergies as of 05/19/2019      Reactions   Horse-derived Products Other (See Comments)   Tetanus:Reaction unknown   Penicillins Other (See Comments)   Did it involve swelling of the face/tongue/throat, SOB, or low BP? Yes-swelling Did it involve sudden or severe rash/hives, skin peeling, or any reaction on the inside of your mouth or nose? No Did you  need to seek medical attention at a hospital or doctor's office? Yes-Dr. office When did it last happen?4 years prior If all above answers are "NO", may proceed with cephalosporin use.   Tetanus Antitoxin    Unknown reaction      Medication List       Accurate as of May 19, 2019 11:59 PM. If you have any questions, ask your nurse or doctor.        acetaminophen 500 MG tablet Commonly known as: TYLENOL Take 500 mg by mouth every 6 (six) hours as needed for mild pain or moderate pain.   amLODipine 5 MG tablet Commonly known as: NORVASC Take 1 tablet (5 mg total) by mouth daily.   apixaban 2.5 MG Tabs tablet Commonly known as: ELIQUIS Take 1 tablet (2.5 mg total) by mouth 2 (two) times daily.   calcium-vitamin D 500-200 MG-UNIT tablet Commonly known as: OSCAL WITH D Take 1 tablet by mouth daily.   donepezil 5 MG tablet Commonly known as: Aricept Take 1 tablet (5 mg total) by mouth at bedtime. Started by: Mechele ClaudeWarren Shuntel Fishburn, MD   enalapril 5 MG tablet Commonly known as: VASOTEC Take 1 tablet (5 mg total) by mouth daily.   ferrous sulfate 325 (65 FE) MG tablet Take 1 tablet (325 mg total) by mouth daily with breakfast.   omeprazole 20 MG capsule Commonly known as: PRILOSEC Take 1 capsule (20 mg total) by mouth daily.   pravastatin 20 MG tablet Commonly known as: PRAVACHOL Take 1 tablet (20 mg total) by mouth daily at 6 PM.   QUEtiapine 25 MG tablet Commonly known as: SEROquel Take 1 tablet (25 mg  total) by mouth at bedtime. Started by: Mechele ClaudeWarren Baylon Santelli, MD   triamcinolone cream 0.1 % Commonly known as: KENALOG Apply 1 application topically 3 (three) times daily. Avoid face and genitalia Started by: Mechele ClaudeWarren Tabius Rood, MD   vitamin C 500 MG tablet Commonly known as: ASCORBIC ACID Take 500 mg by mouth daily.   zinc sulfate 220 (50 Zn) MG capsule Take 1 capsule (220 mg total) by mouth daily.        Follow-up: No follow-ups on file.  Mechele ClaudeWarren Lanier Felty, M.D.

## 2019-05-21 ENCOUNTER — Ambulatory Visit: Payer: Medicare Other

## 2019-05-21 DIAGNOSIS — I69320 Aphasia following cerebral infarction: Secondary | ICD-10-CM | POA: Diagnosis not present

## 2019-05-21 DIAGNOSIS — I495 Sick sinus syndrome: Secondary | ICD-10-CM | POA: Diagnosis not present

## 2019-05-21 DIAGNOSIS — I69398 Other sequelae of cerebral infarction: Secondary | ICD-10-CM | POA: Diagnosis not present

## 2019-05-21 DIAGNOSIS — E785 Hyperlipidemia, unspecified: Secondary | ICD-10-CM | POA: Diagnosis not present

## 2019-05-21 DIAGNOSIS — M6281 Muscle weakness (generalized): Secondary | ICD-10-CM | POA: Diagnosis not present

## 2019-05-21 DIAGNOSIS — Z87891 Personal history of nicotine dependence: Secondary | ICD-10-CM | POA: Diagnosis not present

## 2019-05-21 DIAGNOSIS — K449 Diaphragmatic hernia without obstruction or gangrene: Secondary | ICD-10-CM | POA: Diagnosis not present

## 2019-05-21 DIAGNOSIS — I69354 Hemiplegia and hemiparesis following cerebral infarction affecting left non-dominant side: Secondary | ICD-10-CM | POA: Diagnosis not present

## 2019-05-21 DIAGNOSIS — M1990 Unspecified osteoarthritis, unspecified site: Secondary | ICD-10-CM | POA: Diagnosis not present

## 2019-05-21 DIAGNOSIS — Z950 Presence of cardiac pacemaker: Secondary | ICD-10-CM | POA: Diagnosis not present

## 2019-05-21 DIAGNOSIS — I480 Paroxysmal atrial fibrillation: Secondary | ICD-10-CM | POA: Diagnosis not present

## 2019-05-21 DIAGNOSIS — K219 Gastro-esophageal reflux disease without esophagitis: Secondary | ICD-10-CM | POA: Diagnosis not present

## 2019-05-21 DIAGNOSIS — Z7901 Long term (current) use of anticoagulants: Secondary | ICD-10-CM | POA: Diagnosis not present

## 2019-05-21 DIAGNOSIS — I10 Essential (primary) hypertension: Secondary | ICD-10-CM | POA: Diagnosis not present

## 2019-05-21 LAB — TB SKIN TEST
Induration: 0 mm
TB Skin Test: NEGATIVE

## 2019-05-26 ENCOUNTER — Encounter: Payer: Self-pay | Admitting: Family Medicine

## 2019-05-27 ENCOUNTER — Other Ambulatory Visit: Payer: Self-pay

## 2019-05-27 DIAGNOSIS — I10 Essential (primary) hypertension: Secondary | ICD-10-CM | POA: Diagnosis not present

## 2019-05-27 DIAGNOSIS — Z20822 Contact with and (suspected) exposure to covid-19: Secondary | ICD-10-CM

## 2019-05-27 DIAGNOSIS — Z87891 Personal history of nicotine dependence: Secondary | ICD-10-CM | POA: Diagnosis not present

## 2019-05-27 DIAGNOSIS — K219 Gastro-esophageal reflux disease without esophagitis: Secondary | ICD-10-CM | POA: Diagnosis not present

## 2019-05-27 DIAGNOSIS — Z7901 Long term (current) use of anticoagulants: Secondary | ICD-10-CM | POA: Diagnosis not present

## 2019-05-27 DIAGNOSIS — E785 Hyperlipidemia, unspecified: Secondary | ICD-10-CM | POA: Diagnosis not present

## 2019-05-27 DIAGNOSIS — I495 Sick sinus syndrome: Secondary | ICD-10-CM | POA: Diagnosis not present

## 2019-05-27 DIAGNOSIS — M1990 Unspecified osteoarthritis, unspecified site: Secondary | ICD-10-CM | POA: Diagnosis not present

## 2019-05-27 DIAGNOSIS — K449 Diaphragmatic hernia without obstruction or gangrene: Secondary | ICD-10-CM | POA: Diagnosis not present

## 2019-05-27 DIAGNOSIS — M6281 Muscle weakness (generalized): Secondary | ICD-10-CM | POA: Diagnosis not present

## 2019-05-27 DIAGNOSIS — Z950 Presence of cardiac pacemaker: Secondary | ICD-10-CM | POA: Diagnosis not present

## 2019-05-27 DIAGNOSIS — I69354 Hemiplegia and hemiparesis following cerebral infarction affecting left non-dominant side: Secondary | ICD-10-CM | POA: Diagnosis not present

## 2019-05-27 DIAGNOSIS — I480 Paroxysmal atrial fibrillation: Secondary | ICD-10-CM | POA: Diagnosis not present

## 2019-05-27 DIAGNOSIS — I69320 Aphasia following cerebral infarction: Secondary | ICD-10-CM | POA: Diagnosis not present

## 2019-05-27 DIAGNOSIS — I69398 Other sequelae of cerebral infarction: Secondary | ICD-10-CM | POA: Diagnosis not present

## 2019-05-28 LAB — NOVEL CORONAVIRUS, NAA: SARS-CoV-2, NAA: NOT DETECTED

## 2019-06-02 ENCOUNTER — Telehealth: Payer: Self-pay | Admitting: Family Medicine

## 2019-06-02 DIAGNOSIS — I69398 Other sequelae of cerebral infarction: Secondary | ICD-10-CM | POA: Diagnosis not present

## 2019-06-02 DIAGNOSIS — I69392 Facial weakness following cerebral infarction: Secondary | ICD-10-CM | POA: Diagnosis not present

## 2019-06-02 DIAGNOSIS — M6281 Muscle weakness (generalized): Secondary | ICD-10-CM | POA: Diagnosis not present

## 2019-06-02 NOTE — Telephone Encounter (Signed)
Labs faxed to [facility] per patients request

## 2019-06-02 NOTE — Telephone Encounter (Signed)
Pt's daughter, [name], calling to get COVID results for pt.  Epic states that [name] is on the DPR - let her know they are negative.

## 2019-06-03 ENCOUNTER — Telehealth: Payer: Self-pay | Admitting: Internal Medicine

## 2019-06-03 NOTE — Telephone Encounter (Signed)
Patients daughter calling to reschedule 06/10/19 Home Remote Pacer Check. Patient is moving into a nursing home.

## 2019-06-03 NOTE — Telephone Encounter (Signed)
I rescheduled the pt transmission for 07-01-2019. I explained to the daughter to plug the pt monitor up by her bedside and the facility will know how to send the transmissions.

## 2019-06-05 ENCOUNTER — Ambulatory Visit: Payer: Medicare Other | Admitting: Adult Health

## 2019-06-06 DIAGNOSIS — M6281 Muscle weakness (generalized): Secondary | ICD-10-CM | POA: Diagnosis not present

## 2019-06-06 DIAGNOSIS — R278 Other lack of coordination: Secondary | ICD-10-CM | POA: Diagnosis not present

## 2019-06-09 DIAGNOSIS — R278 Other lack of coordination: Secondary | ICD-10-CM | POA: Diagnosis not present

## 2019-06-09 DIAGNOSIS — M6281 Muscle weakness (generalized): Secondary | ICD-10-CM | POA: Diagnosis not present

## 2019-06-16 DIAGNOSIS — U071 COVID-19: Secondary | ICD-10-CM | POA: Diagnosis not present

## 2019-06-23 DIAGNOSIS — U071 COVID-19: Secondary | ICD-10-CM | POA: Diagnosis not present

## 2019-07-01 ENCOUNTER — Ambulatory Visit (INDEPENDENT_AMBULATORY_CARE_PROVIDER_SITE_OTHER): Payer: Medicare Other | Admitting: *Deleted

## 2019-07-01 DIAGNOSIS — I495 Sick sinus syndrome: Secondary | ICD-10-CM | POA: Diagnosis not present

## 2019-07-01 LAB — CUP PACEART REMOTE DEVICE CHECK
Battery Impedance: 1245 Ohm
Battery Remaining Longevity: 58 mo
Battery Voltage: 2.77 V
Brady Statistic AP VP Percent: 0 %
Brady Statistic AP VS Percent: 41 %
Brady Statistic AS VP Percent: 0 %
Brady Statistic AS VS Percent: 59 %
Date Time Interrogation Session: 20201229150916
Implantable Lead Implant Date: 20100215
Implantable Lead Implant Date: 20100215
Implantable Lead Location: 753859
Implantable Lead Location: 753860
Implantable Lead Model: 5076
Implantable Lead Model: 5076
Implantable Pulse Generator Implant Date: 20100215
Lead Channel Impedance Value: 413 Ohm
Lead Channel Impedance Value: 693 Ohm
Lead Channel Pacing Threshold Amplitude: 0.5 V
Lead Channel Pacing Threshold Amplitude: 1 V
Lead Channel Pacing Threshold Pulse Width: 0.4 ms
Lead Channel Pacing Threshold Pulse Width: 0.4 ms
Lead Channel Setting Pacing Amplitude: 2 V
Lead Channel Setting Pacing Amplitude: 2.5 V
Lead Channel Setting Pacing Pulse Width: 0.4 ms
Lead Channel Setting Sensing Sensitivity: 2.8 mV

## 2019-07-02 DIAGNOSIS — I69392 Facial weakness following cerebral infarction: Secondary | ICD-10-CM | POA: Diagnosis not present

## 2019-07-02 DIAGNOSIS — M6281 Muscle weakness (generalized): Secondary | ICD-10-CM | POA: Diagnosis not present

## 2019-07-02 DIAGNOSIS — I69398 Other sequelae of cerebral infarction: Secondary | ICD-10-CM | POA: Diagnosis not present

## 2019-08-02 DIAGNOSIS — I69392 Facial weakness following cerebral infarction: Secondary | ICD-10-CM | POA: Diagnosis not present

## 2019-08-02 DIAGNOSIS — I69398 Other sequelae of cerebral infarction: Secondary | ICD-10-CM | POA: Diagnosis not present

## 2019-08-02 DIAGNOSIS — M6281 Muscle weakness (generalized): Secondary | ICD-10-CM | POA: Diagnosis not present

## 2019-08-31 DIAGNOSIS — I69398 Other sequelae of cerebral infarction: Secondary | ICD-10-CM | POA: Diagnosis not present

## 2019-08-31 DIAGNOSIS — M6281 Muscle weakness (generalized): Secondary | ICD-10-CM | POA: Diagnosis not present

## 2019-08-31 DIAGNOSIS — I69392 Facial weakness following cerebral infarction: Secondary | ICD-10-CM | POA: Diagnosis not present

## 2019-09-30 ENCOUNTER — Ambulatory Visit (INDEPENDENT_AMBULATORY_CARE_PROVIDER_SITE_OTHER): Payer: Medicare Other | Admitting: *Deleted

## 2019-09-30 ENCOUNTER — Other Ambulatory Visit: Payer: Self-pay

## 2019-09-30 DIAGNOSIS — M6281 Muscle weakness (generalized): Secondary | ICD-10-CM | POA: Diagnosis not present

## 2019-09-30 DIAGNOSIS — I69392 Facial weakness following cerebral infarction: Secondary | ICD-10-CM | POA: Diagnosis not present

## 2019-09-30 DIAGNOSIS — Z950 Presence of cardiac pacemaker: Secondary | ICD-10-CM

## 2019-09-30 DIAGNOSIS — I69398 Other sequelae of cerebral infarction: Secondary | ICD-10-CM | POA: Diagnosis not present

## 2019-09-30 LAB — CUP PACEART REMOTE DEVICE CHECK
Battery Impedance: 1351 Ohm
Battery Remaining Longevity: 53 mo
Battery Voltage: 2.76 V
Brady Statistic AP VP Percent: 0 %
Brady Statistic AP VS Percent: 49 %
Brady Statistic AS VP Percent: 0 %
Brady Statistic AS VS Percent: 50 %
Date Time Interrogation Session: 20210330092551
Implantable Lead Implant Date: 20100215
Implantable Lead Implant Date: 20100215
Implantable Lead Location: 753859
Implantable Lead Location: 753860
Implantable Lead Model: 5076
Implantable Lead Model: 5076
Implantable Pulse Generator Implant Date: 20100215
Lead Channel Impedance Value: 437 Ohm
Lead Channel Impedance Value: 673 Ohm
Lead Channel Pacing Threshold Amplitude: 0.5 V
Lead Channel Pacing Threshold Amplitude: 1 V
Lead Channel Pacing Threshold Pulse Width: 0.4 ms
Lead Channel Pacing Threshold Pulse Width: 0.4 ms
Lead Channel Setting Pacing Amplitude: 2 V
Lead Channel Setting Pacing Amplitude: 2.5 V
Lead Channel Setting Pacing Pulse Width: 0.4 ms
Lead Channel Setting Sensing Sensitivity: 4 mV

## 2019-09-30 NOTE — Progress Notes (Signed)
PPM remote 

## 2019-10-06 ENCOUNTER — Encounter: Payer: Self-pay | Admitting: Family Medicine

## 2019-10-06 ENCOUNTER — Telehealth (INDEPENDENT_AMBULATORY_CARE_PROVIDER_SITE_OTHER): Payer: Medicare Other | Admitting: Family Medicine

## 2019-10-06 DIAGNOSIS — I69354 Hemiplegia and hemiparesis following cerebral infarction affecting left non-dominant side: Secondary | ICD-10-CM

## 2019-10-06 NOTE — Progress Notes (Signed)
    Subjective:    Patient ID: Kelsey Thompson, female    DOB: May 15, 1921, 86 y.o.   MRN: [id number]   HPI: Kelsey Thompson is a 86 y.o. female presenting for video visit for need for physical therapy. Having trouble walking. Uses a walker, but weaker on the left side. Left hand strength and coordination is declining. Resident of [facility]. Pt. Lost strength as a result of CVA   Depression screen [facility] 2/9 05/19/2019 04/07/2019 02/26/2019 01/08/2019  Decreased Interest 0 0 0 0  Down, Depressed, Hopeless 1 0 0 0  PHQ - 2 Score 1 0 0 0     Relevant past medical, surgical, family and social history reviewed and updated as indicated.  Interim medical history since our last visit reviewed. Allergies and medications reviewed and updated.  ROS:  Review of Systems  Constitutional: Negative.   HENT: Negative.   Eyes: Negative for visual disturbance.  Respiratory: Negative for shortness of breath.   Cardiovascular: Negative for chest pain.  Gastrointestinal: Negative for abdominal pain.  Musculoskeletal: Positive for arthralgias.  Neurological: Positive for weakness (left hand and leg).     Social History   Tobacco Use  Smoking Status Former Smoker  . Types: Cigarettes  . Quit date: 06/30/1981  . Years since quitting: 38.2  Smokeless Tobacco Never Used       Objective:     Wt Readings from Last 3 Encounters:  05/19/19 121 lb (54.9 kg)  04/07/19 121 lb (54.9 kg)  02/26/19 108 lb 3.2 oz (49.1 kg)     Exam deferred. Pt. Harboring due to COVID 19. Phone visit performed.   Assessment & Plan:   1. Hemiparesis affecting left side as late effect of cerebrovascular accident (CVA) (HCC)     No orders of the defined types were placed in this encounter.   Orders Placed This Encounter  Procedures  . Ambulatory referral to Physical Therapy    Referral Priority:   Routine    Referral Type:   Physical Medicine    Referral Reason:   Specialty Services Required    Requested  Specialty:   Physical Therapy    Number of Visits Requested:   1      Diagnoses and all orders for this visit:  Hemiparesis affecting left side as late effect of cerebrovascular accident (CVA) (HCC) -     Ambulatory referral to Physical Therapy    Virtual Visit via telephone Note  I discussed the limitations, risks, security and privacy concerns of performing an evaluation and management service by telephone and the availability of in person appointments. The patient was identified with two identifiers. Pt.expressed understanding and agreed to proceed. Pt. Is at home. Dr. Darlyn Read is in his office.  Follow Up Instructions:   I discussed the assessment and treatment plan with the patient. The patient was provided an opportunity to ask questions and all were answered. The patient agreed with the plan and demonstrated an understanding of the instructions.   The patient was advised to call back or seek an in-person evaluation if the symptoms worsen or if the condition fails to improve as anticipated.   Total minutes including chart review and phone contact time: 14   Follow up plan: Return in about 6 months (around 04/06/2020), or if symptoms worsen or fail to improve.  Mechele Claude, MD [facility]

## 2019-10-15 DIAGNOSIS — M6281 Muscle weakness (generalized): Secondary | ICD-10-CM | POA: Diagnosis not present

## 2019-10-15 DIAGNOSIS — R269 Unspecified abnormalities of gait and mobility: Secondary | ICD-10-CM | POA: Diagnosis not present

## 2019-10-16 DIAGNOSIS — M6281 Muscle weakness (generalized): Secondary | ICD-10-CM | POA: Diagnosis not present

## 2019-10-16 DIAGNOSIS — R269 Unspecified abnormalities of gait and mobility: Secondary | ICD-10-CM | POA: Diagnosis not present

## 2019-10-31 DIAGNOSIS — M6281 Muscle weakness (generalized): Secondary | ICD-10-CM | POA: Diagnosis not present

## 2019-10-31 DIAGNOSIS — I69392 Facial weakness following cerebral infarction: Secondary | ICD-10-CM | POA: Diagnosis not present

## 2019-10-31 DIAGNOSIS — I69398 Other sequelae of cerebral infarction: Secondary | ICD-10-CM | POA: Diagnosis not present

## 2019-11-07 DIAGNOSIS — U071 COVID-19: Secondary | ICD-10-CM | POA: Diagnosis not present

## 2019-11-30 DIAGNOSIS — M6281 Muscle weakness (generalized): Secondary | ICD-10-CM | POA: Diagnosis not present

## 2019-11-30 DIAGNOSIS — I69392 Facial weakness following cerebral infarction: Secondary | ICD-10-CM | POA: Diagnosis not present

## 2019-11-30 DIAGNOSIS — I69398 Other sequelae of cerebral infarction: Secondary | ICD-10-CM | POA: Diagnosis not present

## 2019-12-30 ENCOUNTER — Ambulatory Visit (INDEPENDENT_AMBULATORY_CARE_PROVIDER_SITE_OTHER): Payer: Medicare Other | Admitting: *Deleted

## 2019-12-30 DIAGNOSIS — U071 COVID-19: Secondary | ICD-10-CM | POA: Diagnosis not present

## 2019-12-30 DIAGNOSIS — I495 Sick sinus syndrome: Secondary | ICD-10-CM | POA: Diagnosis not present

## 2019-12-30 LAB — CUP PACEART REMOTE DEVICE CHECK
Battery Impedance: 1432 Ohm
Battery Remaining Longevity: 51 mo
Battery Voltage: 2.76 V
Brady Statistic AP VP Percent: 0 %
Brady Statistic AP VS Percent: 56 %
Brady Statistic AS VP Percent: 0 %
Brady Statistic AS VS Percent: 43 %
Date Time Interrogation Session: 20210629080534
Implantable Lead Implant Date: 20100215
Implantable Lead Implant Date: 20100215
Implantable Lead Location: 753859
Implantable Lead Location: 753860
Implantable Lead Model: 5076
Implantable Lead Model: 5076
Implantable Pulse Generator Implant Date: 20100215
Lead Channel Impedance Value: 443 Ohm
Lead Channel Impedance Value: 652 Ohm
Lead Channel Pacing Threshold Amplitude: 0.5 V
Lead Channel Pacing Threshold Amplitude: 1 V
Lead Channel Pacing Threshold Pulse Width: 0.4 ms
Lead Channel Pacing Threshold Pulse Width: 0.4 ms
Lead Channel Setting Pacing Amplitude: 2 V
Lead Channel Setting Pacing Amplitude: 2.5 V
Lead Channel Setting Pacing Pulse Width: 0.4 ms
Lead Channel Setting Sensing Sensitivity: 2.8 mV

## 2019-12-31 DIAGNOSIS — I69398 Other sequelae of cerebral infarction: Secondary | ICD-10-CM | POA: Diagnosis not present

## 2019-12-31 DIAGNOSIS — I69392 Facial weakness following cerebral infarction: Secondary | ICD-10-CM | POA: Diagnosis not present

## 2019-12-31 DIAGNOSIS — M6281 Muscle weakness (generalized): Secondary | ICD-10-CM | POA: Diagnosis not present

## 2019-12-31 NOTE — Progress Notes (Signed)
Remote pacemaker transmission.   

## 2020-03-01 ENCOUNTER — Telehealth: Payer: Self-pay | Admitting: Family Medicine

## 2020-03-02 NOTE — Telephone Encounter (Signed)
Since I is for a house, the determination should be made by psychiatry. I can make a referral if desired.

## 2020-03-02 NOTE — Telephone Encounter (Signed)
I spoke with the pt's daughter and she is going to speak with the grandson that is buying the house and the attorney and will call us back about the need for referral.

## 2020-03-30 ENCOUNTER — Ambulatory Visit (INDEPENDENT_AMBULATORY_CARE_PROVIDER_SITE_OTHER): Payer: Medicare Other | Admitting: Emergency Medicine

## 2020-03-30 DIAGNOSIS — I495 Sick sinus syndrome: Secondary | ICD-10-CM

## 2020-03-30 LAB — CUP PACEART REMOTE DEVICE CHECK
Battery Impedance: 1515 Ohm
Battery Remaining Longevity: 48 mo
Battery Voltage: 2.76 V
Brady Statistic AP VP Percent: 0 %
Brady Statistic AP VS Percent: 62 %
Brady Statistic AS VP Percent: 0 %
Brady Statistic AS VS Percent: 37 %
Date Time Interrogation Session: 20210928141356
Implantable Lead Implant Date: 20100215
Implantable Lead Implant Date: 20100215
Implantable Lead Location: 753859
Implantable Lead Location: 753860
Implantable Lead Model: 5076
Implantable Lead Model: 5076
Implantable Pulse Generator Implant Date: 20100215
Lead Channel Impedance Value: 431 Ohm
Lead Channel Impedance Value: 684 Ohm
Lead Channel Pacing Threshold Amplitude: 0.5 V
Lead Channel Pacing Threshold Amplitude: 1 V
Lead Channel Pacing Threshold Pulse Width: 0.4 ms
Lead Channel Pacing Threshold Pulse Width: 0.4 ms
Lead Channel Setting Pacing Amplitude: 2 V
Lead Channel Setting Pacing Amplitude: 2.5 V
Lead Channel Setting Pacing Pulse Width: 0.4 ms
Lead Channel Setting Sensing Sensitivity: 2.8 mV

## 2020-04-01 NOTE — Progress Notes (Signed)
Remote pacemaker transmission.   

## 2020-04-16 ENCOUNTER — Telehealth: Payer: Self-pay | Admitting: Family Medicine

## 2020-04-16 NOTE — Telephone Encounter (Signed)
Patient aware and verbalized understanding. °

## 2020-04-16 NOTE — Telephone Encounter (Signed)
Tell them that they can go ahead and give her a Benadryl 25 mg to see if it helps.

## 2020-04-16 NOTE — Telephone Encounter (Signed)
Patient just had flu shot recently now complaining of itching and redness around inside of elbow. [facility] has a standing order for hydrocortisone cream. Its not helping.  no complain of SOB. What is something you could suggest that give her. Please advise.

## 2020-06-29 ENCOUNTER — Ambulatory Visit (INDEPENDENT_AMBULATORY_CARE_PROVIDER_SITE_OTHER): Payer: Medicare Other

## 2020-06-29 DIAGNOSIS — I495 Sick sinus syndrome: Secondary | ICD-10-CM

## 2020-06-29 LAB — CUP PACEART REMOTE DEVICE CHECK
Battery Impedance: 1598 Ohm
Battery Remaining Longevity: 46 mo
Battery Voltage: 2.76 V
Brady Statistic AP VP Percent: 0 %
Brady Statistic AP VS Percent: 66 %
Brady Statistic AS VP Percent: 0 %
Brady Statistic AS VS Percent: 33 %
Date Time Interrogation Session: 20211228133951
Implantable Lead Implant Date: 20100215
Implantable Lead Implant Date: 20100215
Implantable Lead Location: 753859
Implantable Lead Location: 753860
Implantable Lead Model: 5076
Implantable Lead Model: 5076
Implantable Pulse Generator Implant Date: 20100215
Lead Channel Impedance Value: 443 Ohm
Lead Channel Impedance Value: 682 Ohm
Lead Channel Pacing Threshold Amplitude: 0.375 V
Lead Channel Pacing Threshold Amplitude: 1.125 V
Lead Channel Pacing Threshold Pulse Width: 0.4 ms
Lead Channel Pacing Threshold Pulse Width: 0.4 ms
Lead Channel Setting Pacing Amplitude: 2 V
Lead Channel Setting Pacing Amplitude: 2.5 V
Lead Channel Setting Pacing Pulse Width: 0.4 ms
Lead Channel Setting Sensing Sensitivity: 2.8 mV

## 2020-07-01 DIAGNOSIS — H10013 Acute follicular conjunctivitis, bilateral: Secondary | ICD-10-CM | POA: Diagnosis not present

## 2020-07-12 NOTE — Progress Notes (Signed)
Remote pacemaker transmission.   

## 2020-09-10 DIAGNOSIS — U071 COVID-19: Secondary | ICD-10-CM | POA: Diagnosis not present

## 2020-09-10 DIAGNOSIS — B351 Tinea unguium: Secondary | ICD-10-CM | POA: Diagnosis not present

## 2020-09-28 ENCOUNTER — Ambulatory Visit (INDEPENDENT_AMBULATORY_CARE_PROVIDER_SITE_OTHER): Payer: Medicare Other

## 2020-09-28 DIAGNOSIS — I495 Sick sinus syndrome: Secondary | ICD-10-CM

## 2020-09-28 LAB — CUP PACEART REMOTE DEVICE CHECK
Battery Impedance: 1710 Ohm
Battery Remaining Longevity: 43 mo
Battery Voltage: 2.76 V
Brady Statistic AP VP Percent: 0 %
Brady Statistic AP VS Percent: 69 %
Brady Statistic AS VP Percent: 0 %
Brady Statistic AS VS Percent: 31 %
Date Time Interrogation Session: 20220329122319
Implantable Lead Implant Date: 20100215
Implantable Lead Implant Date: 20100215
Implantable Lead Location: 753859
Implantable Lead Location: 753860
Implantable Lead Model: 5076
Implantable Lead Model: 5076
Implantable Pulse Generator Implant Date: 20100215
Lead Channel Impedance Value: 443 Ohm
Lead Channel Impedance Value: 660 Ohm
Lead Channel Pacing Threshold Amplitude: 0.375 V
Lead Channel Pacing Threshold Amplitude: 1.125 V
Lead Channel Pacing Threshold Pulse Width: 0.4 ms
Lead Channel Pacing Threshold Pulse Width: 0.4 ms
Lead Channel Setting Pacing Amplitude: 2 V
Lead Channel Setting Pacing Amplitude: 2.5 V
Lead Channel Setting Pacing Pulse Width: 0.4 ms
Lead Channel Setting Sensing Sensitivity: 2.8 mV

## 2020-10-11 ENCOUNTER — Telehealth: Payer: Self-pay | Admitting: *Deleted

## 2020-10-11 ENCOUNTER — Ambulatory Visit (INDEPENDENT_AMBULATORY_CARE_PROVIDER_SITE_OTHER): Payer: Medicare Other | Admitting: Nurse Practitioner

## 2020-10-11 ENCOUNTER — Other Ambulatory Visit: Payer: Self-pay

## 2020-10-11 ENCOUNTER — Encounter: Payer: Self-pay | Admitting: Nurse Practitioner

## 2020-10-11 VITALS — BP 116/59 | HR 63 | Temp 97.6°F | Resp 20

## 2020-10-11 DIAGNOSIS — R410 Disorientation, unspecified: Secondary | ICD-10-CM

## 2020-10-11 LAB — URINALYSIS, COMPLETE
Bilirubin, UA: NEGATIVE
Glucose, UA: NEGATIVE
Nitrite, UA: NEGATIVE
RBC, UA: NEGATIVE
Specific Gravity, UA: 1.02 (ref 1.005–1.030)
Urobilinogen, Ur: 0.2 mg/dL (ref 0.2–1.0)
pH, UA: 5 (ref 5.0–7.5)

## 2020-10-11 LAB — MICROSCOPIC EXAMINATION: RBC, Urine: NONE SEEN /hpf (ref 0–2)

## 2020-10-11 NOTE — Progress Notes (Signed)
   Subjective:    Patient ID: Kelsey Thompson, female    DOB: 05-31-1921, 87 y.o.   MRN: [id number]   Chief Complaint: Altered Mental Status   HPI  Pt presents today with her care takers with increased confusion over the past week. Has attempted to leave her care facility 3 times. Per pt, she was not trying to leave the facility, rather "I wanted to get some fresh air." Per her caretakers, her daughter believes her mother is acting differently. She was seen by Dr. Darlyn Read for a similar issue in Nov of 2020. At that time, he placed her on Seroquel and Aricept.      Review of Systems  Respiratory: Negative for cough, shortness of breath and wheezing.   Cardiovascular: Negative for chest pain and palpitations.  Gastrointestinal: Negative for abdominal pain, constipation and diarrhea.  Genitourinary: Positive for frequency. Negative for difficulty urinating, dyspareunia and hematuria.  Neurological: Negative for dizziness, syncope, speech difficulty and headaches.  Psychiatric/Behavioral: Positive for confusion. Negative for hallucinations. The patient is not nervous/anxious.        Increased confusion per her caregiver. Pt denies feeling confused.     Objective:   Physical Exam Constitutional:      Appearance: Normal appearance.  Cardiovascular:     Rate and Rhythm: Normal rate and regular rhythm.     Pulses: Normal pulses.     Heart sounds: Murmur heard.    Neurological:     Mental Status: She is alert and oriented to person, place, and time.  Psychiatric:        Mood and Affect: Mood normal.        Behavior: Behavior normal.        Thought Content: Thought content normal.   MME score of 22   UA (+1) Leuks (-) Nitrites (-) Blood  Vitals:   10/11/20 1355  BP: (!) 116/59  Pulse: 63  Resp: 20  Temp: 97.6 F (36.4 C)  SpO2: 95%       Assessment & Plan:   CERIAH KOHLER comes in today with chief complaint of Altered Mental Status   Diagnosis and orders  addressed:  1. Confusion Begin taking seroquel at 8pm vs 5pm. This should help better manage her evening confusion.  - Urinalysis, Complete   Follow up plan: PRN  Oretha Milch, RN, BSN, FNP-Student  Mary-Margaret Daphine Deutscher, FNP

## 2020-10-11 NOTE — Telephone Encounter (Signed)
Pt is a resident at [facility] Daughter is wanting to know if [facility] can get an order for a UA Pt is more confused that normal, is setting off door alarms Please advise

## 2020-10-11 NOTE — Telephone Encounter (Signed)
Yes of course.  Please order urine culture and vital signs as well, including O2/ RR

## 2020-10-11 NOTE — Telephone Encounter (Signed)
Cindy aware of orders. Will get signed order faxed to [facility]

## 2020-10-12 ENCOUNTER — Emergency Department (HOSPITAL_COMMUNITY): Payer: Medicare Other

## 2020-10-12 ENCOUNTER — Emergency Department (HOSPITAL_COMMUNITY)
Admission: EM | Admit: 2020-10-12 | Discharge: 2020-10-13 | Disposition: A | Discharge status: Home / Self Care | Payer: Medicare Other | Attending: Emergency Medicine | Admitting: Emergency Medicine

## 2020-10-12 ENCOUNTER — Telehealth: Payer: Self-pay

## 2020-10-12 ENCOUNTER — Other Ambulatory Visit: Payer: Self-pay

## 2020-10-12 ENCOUNTER — Encounter (HOSPITAL_COMMUNITY): Payer: Self-pay

## 2020-10-12 DIAGNOSIS — R0902 Hypoxemia: Secondary | ICD-10-CM | POA: Diagnosis not present

## 2020-10-12 DIAGNOSIS — R451 Restlessness and agitation: Secondary | ICD-10-CM | POA: Diagnosis not present

## 2020-10-12 DIAGNOSIS — Z87891 Personal history of nicotine dependence: Secondary | ICD-10-CM | POA: Insufficient documentation

## 2020-10-12 DIAGNOSIS — Z950 Presence of cardiac pacemaker: Secondary | ICD-10-CM | POA: Insufficient documentation

## 2020-10-12 DIAGNOSIS — I10 Essential (primary) hypertension: Secondary | ICD-10-CM | POA: Diagnosis not present

## 2020-10-12 DIAGNOSIS — R4182 Altered mental status, unspecified: Secondary | ICD-10-CM | POA: Diagnosis not present

## 2020-10-12 DIAGNOSIS — Z743 Need for continuous supervision: Secondary | ICD-10-CM | POA: Diagnosis not present

## 2020-10-12 DIAGNOSIS — N390 Urinary tract infection, site not specified: Secondary | ICD-10-CM

## 2020-10-12 DIAGNOSIS — Z79899 Other long term (current) drug therapy: Secondary | ICD-10-CM | POA: Insufficient documentation

## 2020-10-12 DIAGNOSIS — R410 Disorientation, unspecified: Secondary | ICD-10-CM | POA: Diagnosis present

## 2020-10-12 DIAGNOSIS — Z20822 Contact with and (suspected) exposure to covid-19: Secondary | ICD-10-CM | POA: Insufficient documentation

## 2020-10-12 DIAGNOSIS — Z7901 Long term (current) use of anticoagulants: Secondary | ICD-10-CM | POA: Insufficient documentation

## 2020-10-12 DIAGNOSIS — R404 Transient alteration of awareness: Secondary | ICD-10-CM | POA: Diagnosis not present

## 2020-10-12 DIAGNOSIS — I4891 Unspecified atrial fibrillation: Secondary | ICD-10-CM | POA: Insufficient documentation

## 2020-10-12 DIAGNOSIS — I517 Cardiomegaly: Secondary | ICD-10-CM | POA: Diagnosis not present

## 2020-10-12 DIAGNOSIS — K449 Diaphragmatic hernia without obstruction or gangrene: Secondary | ICD-10-CM | POA: Diagnosis not present

## 2020-10-12 LAB — CBC WITH DIFFERENTIAL/PLATELET
Abs Immature Granulocytes: 0.02 10*3/uL (ref 0.00–0.07)
Basophils Absolute: 0.1 10*3/uL (ref 0.0–0.1)
Basophils Relative: 1 %
Eosinophils Absolute: 0 10*3/uL (ref 0.0–0.5)
Eosinophils Relative: 1 %
HCT: 34.1 % — ABNORMAL LOW (ref 36.0–46.0)
Hemoglobin: 11.3 g/dL — ABNORMAL LOW (ref 12.0–15.0)
Immature Granulocytes: 0 %
Lymphocytes Relative: 19 %
Lymphs Abs: 1.2 10*3/uL (ref 0.7–4.0)
MCH: 30.7 pg (ref 26.0–34.0)
MCHC: 33.1 g/dL (ref 30.0–36.0)
MCV: 92.7 fL (ref 80.0–100.0)
Monocytes Absolute: 0.6 10*3/uL (ref 0.1–1.0)
Monocytes Relative: 9 %
Neutro Abs: 4.6 10*3/uL (ref 1.7–7.7)
Neutrophils Relative %: 70 %
Platelets: 250 10*3/uL (ref 150–400)
RBC: 3.68 MIL/uL — ABNORMAL LOW (ref 3.87–5.11)
RDW: 14.8 % (ref 11.5–15.5)
WBC: 6.5 10*3/uL (ref 4.0–10.5)
nRBC: 0 % (ref 0.0–0.2)

## 2020-10-12 LAB — HEPATIC FUNCTION PANEL
ALT: 18 U/L (ref 0–44)
AST: 23 U/L (ref 15–41)
Albumin: 3.7 g/dL (ref 3.5–5.0)
Alkaline Phosphatase: 64 U/L (ref 38–126)
Bilirubin, Direct: 0.1 mg/dL (ref 0.0–0.2)
Indirect Bilirubin: 0.5 mg/dL (ref 0.3–0.9)
Total Bilirubin: 0.6 mg/dL (ref 0.3–1.2)
Total Protein: 6.9 g/dL (ref 6.5–8.1)

## 2020-10-12 LAB — BASIC METABOLIC PANEL
Anion gap: 11 (ref 5–15)
BUN: 20 mg/dL (ref 8–23)
CO2: 23 mmol/L (ref 22–32)
Calcium: 9.4 mg/dL (ref 8.9–10.3)
Chloride: 99 mmol/L (ref 98–111)
Creatinine, Ser: 1.1 mg/dL — ABNORMAL HIGH (ref 0.44–1.00)
GFR, Estimated: 45 mL/min — ABNORMAL LOW (ref >60)
Glucose, Bld: 109 mg/dL — ABNORMAL HIGH (ref 70–99)
Potassium: 4.3 mmol/L (ref 3.5–5.1)
Sodium: 133 mmol/L — ABNORMAL LOW (ref 135–145)

## 2020-10-12 LAB — URINALYSIS, ROUTINE W REFLEX MICROSCOPIC
Bilirubin Urine: NEGATIVE
Glucose, UA: NEGATIVE mg/dL
Hgb urine dipstick: NEGATIVE
Ketones, ur: 20 mg/dL — AB
Nitrite: NEGATIVE
Protein, ur: NEGATIVE mg/dL
Specific Gravity, Urine: 1.011 (ref 1.005–1.030)
pH: 6 (ref 5.0–8.0)

## 2020-10-12 LAB — AMMONIA: Ammonia: 10 umol/L (ref 9–35)

## 2020-10-12 LAB — TROPONIN I (HIGH SENSITIVITY)
Troponin I (High Sensitivity): 37 ng/L — ABNORMAL HIGH (ref <18)
Troponin I (High Sensitivity): 40 ng/L — ABNORMAL HIGH (ref <18)

## 2020-10-12 MED ORDER — ZIPRASIDONE MESYLATE 20 MG IM SOLR
INTRAMUSCULAR | Status: AC
  Administered 2020-10-12: 10 mg via INTRAMUSCULAR
  Filled 2020-10-12: qty 20

## 2020-10-12 MED ORDER — CIPROFLOXACIN IN D5W 400 MG/200ML IV SOLN: 400.0000 mg | Freq: Once | INTRAVENOUS | Status: DC

## 2020-10-12 MED ORDER — ZIPRASIDONE MESYLATE 20 MG IM SOLR
10.0000 mg | Freq: Once | INTRAMUSCULAR | Status: AC
  Filled 2020-10-12: qty 20

## 2020-10-12 MED ORDER — STERILE WATER FOR INJECTION IJ SOLN
INTRAMUSCULAR | Status: AC
  Administered 2020-10-12: 10 mL
  Filled 2020-10-12: qty 10

## 2020-10-12 MED ORDER — CIPROFLOXACIN HCL 250 MG PO TABS
500.0000 mg | ORAL_TABLET | Freq: Once | ORAL | Status: AC
  Administered 2020-10-12: 500 mg via ORAL
  Filled 2020-10-12: qty 2

## 2020-10-12 NOTE — Telephone Encounter (Signed)
THat is fine

## 2020-10-12 NOTE — ED Provider Notes (Signed)
[facility] EMERGENCY DEPARTMENT Provider Note   CSN: [id number] Arrival date & time: 10/12/20  1838     History Chief Complaint  Patient presents with  . Aggressive Behavior    Kelsey Thompson is a 87 y.o. female with history of stroke, hypertension, hyperlipidemia, bradycardia status post pacemaker, paroxysmal A. fib on Eliquis, presenting to emergency department with confusion for 1 week.  Patient is unable to provide reliable history.  Her daughter by phone provides me history.  She reports that for the past several days, the patient has been behaving strangely.  Daughter reports "normally she is as sweet as can be, calm, and cooperative.  She can hold a conversation."  However for the past several days, the patient has been making erratic statements, for example repeating that  someone is going to bomb her facility.  She has been asking to be taken for a ride around town, which she never does.  Today the patient attempted to wheel herself out of the building and escape, and the staff had to redirect her back inside.  She was extremely agitated with the staff, which is unusual.  The covering provider at the facility determined she should come to the emergency department for evaluation.  Daughter susan flynt gave this history.  She comes from [employer]  HPI     Past Medical History:  Diagnosis Date  . Arthritis   . Bradycardia   . GERD (gastroesophageal reflux disease)   . Hiatal hernia   . Hypercholesterolemia   . Hypertension   . Pacemaker   . Stroke [facility])     Patient Active Problem List   Diagnosis Date Noted  . Hemiparesis affecting left side as late effect of cerebrovascular accident (CVA) (HCC) 05/19/2019  . Paroxysmal atrial fibrillation (HCC) 01/21/2019  . Hyperlipidemia LDL goal <70 01/21/2019  . Embolic stroke (HCC) R brain s/p tPA d/t AF 01/16/2019  . Acute pain of left knee 07/17/2014  . Sinoatrial node dysfunction (HCC) 09/26/2011  . Cardiac pacemaker in  situ 11/19/2008  . Essential hypertension 11/07/2008  . BRADYCARDIA 11/07/2008  . GERD 11/07/2008  . Diaphragmatic hernia 11/07/2008    Past Surgical History:  Procedure Laterality Date  . APPENDECTOMY    . CESAREAN SECTION    . PACEMAKER INSERTION    . TONSILLECTOMY       OB History   No obstetric history on file.     Family History  Problem Relation Age of Onset  . Heart failure Mother   . Diabetes Father   . Heart attack Father     Social History   Tobacco Use  . Smoking status: Former Smoker    Types: Cigarettes    Quit date: 06/30/1981    Years since quitting: 39.3  . Smokeless tobacco: Never Used  Vaping Use  . Vaping Use: Never used  Substance Use Topics  . Alcohol use: No    Alcohol/week: 0.0 standard drinks  . Drug use: No    Home Medications Prior to Admission medications   Medication Sig Start Date End Date Taking? Authorizing Provider  acetaminophen (TYLENOL) 500 MG tablet Take 500 mg by mouth every 6 (six) hours as needed for mild pain or moderate pain.   Yes [provider]  amLODipine (NORVASC) 5 MG tablet Take 1 tablet (5 mg total) by mouth daily. 04/07/19  Yes Mechele Claude, MD  apixaban (ELIQUIS) 2.5 MG TABS tablet Take 1 tablet (2.5 mg total) by mouth 2 (two) times daily.  04/07/19  Yes Stacks, Broadus John, MD  calcium-vitamin D (OSCAL WITH D) 500-200 MG-UNIT per tablet Take 1 tablet by mouth daily.   Yes [provider]  donepezil (ARICEPT) 5 MG tablet Take 1 tablet (5 mg total) by mouth at bedtime. 05/19/19  Yes Mechele Claude, MD  enalapril (VASOTEC) 5 MG tablet Take 1 tablet (5 mg total) by mouth daily. 04/07/19  Yes Mechele Claude, MD  ferrous sulfate 325 (65 FE) MG tablet Take 1 tablet (325 mg total) by mouth daily with breakfast. 04/07/19  Yes Mechele Claude, MD  omeprazole (PRILOSEC) 20 MG capsule Take 1 capsule (20 mg total) by mouth daily. 04/07/19  Yes Mechele Claude, MD  pravastatin (PRAVACHOL) 20 MG tablet Take 1 tablet (20  mg total) by mouth daily at 6 PM. 04/07/19  Yes Stacks, Broadus John, MD  Propylene Glycol (SYSTANE COMPLETE OP) Apply 1 drop to eye 4 (four) times daily.   Yes [provider]  QUEtiapine (SEROQUEL) 25 MG tablet Take 1 tablet (25 mg total) by mouth at bedtime. 05/19/19  Yes Mechele Claude, MD  vitamin C (ASCORBIC ACID) 500 MG tablet Take 500 mg by mouth daily.   Yes [provider]  zinc sulfate 220 (50 Zn) MG capsule Take 1 capsule (220 mg total) by mouth daily. 04/07/19  Yes Stacks, Broadus John, MD  clindamycin (CLEOCIN) 150 MG capsule Take 150 mg by mouth every 6 (six) hours. Patient not taking: Reported on 10/12/2020 08/17/20   [provider]  triamcinolone cream (KENALOG) 0.1 % Apply 1 application topically 3 (three) times daily. Avoid face and genitalia Patient not taking: No sig reported 05/19/19   Mechele Claude, MD    Allergies    Horse-derived products, Penicillins, and Tetanus antitoxin  Review of Systems   Review of Systems  Unable to perform ROS: Dementia (level 5 caveat)    Physical Exam Updated Vital Signs BP 130/85   Pulse 75   Temp 97.8 F (36.6 C) (Oral)   Resp 16   Ht 5\' 3"  (1.6 m)   Wt 55 kg   SpO2 96%   BMI 21.48 kg/m   Physical Exam Constitutional:      General: She is not in acute distress. HENT:     Head: Normocephalic and atraumatic.  Eyes:     Conjunctiva/sclera: Conjunctivae normal.     Pupils: Pupils are equal, round, and reactive to light.  Cardiovascular:     Rate and Rhythm: Normal rate and regular rhythm.  Pulmonary:     Effort: Pulmonary effort is normal. No respiratory distress.  Abdominal:     General: There is no distension.     Tenderness: There is no abdominal tenderness.  Skin:    General: Skin is warm and dry.  Neurological:     General: No focal deficit present.     Mental Status: She is alert. Mental status is at baseline.     ED Results / Procedures / Treatments   Labs (all labs ordered are listed, but  only abnormal results are displayed) Labs Reviewed  BASIC METABOLIC PANEL - Abnormal; Notable for the following components:      Result Value   Sodium 133 (*)    Glucose, Bld 109 (*)    Creatinine, Ser 1.10 (*)    GFR, Estimated 45 (*)    All other components within normal limits  CBC WITH DIFFERENTIAL/PLATELET - Abnormal; Notable for the following components:   RBC 3.68 (*)    Hemoglobin 11.3 (*)  HCT 34.1 (*)    All other components within normal limits  URINALYSIS, ROUTINE W REFLEX MICROSCOPIC - Abnormal; Notable for the following components:   Ketones, ur 20 (*)    Leukocytes,Ua MODERATE (*)    Bacteria, UA RARE (*)    All other components within normal limits  TROPONIN I (HIGH SENSITIVITY) - Abnormal; Notable for the following components:   Troponin I (High Sensitivity) 37 (*)    All other components within normal limits  TROPONIN I (HIGH SENSITIVITY) - Abnormal; Notable for the following components:   Troponin I (High Sensitivity) 40 (*)    All other components within normal limits  URINE CULTURE  AMMONIA  HEPATIC FUNCTION PANEL    EKG None  Radiology No results found.  Procedures Procedures   Medications Ordered in ED Medications  ciprofloxacin (CIPRO) tablet 500 mg (500 mg Oral Given 10/12/20 2221)  ziprasidone (GEODON) injection 10 mg (10 mg Intramuscular Given 10/12/20 2306)  sterile water (preservative free) injection (10 mLs  Given 10/12/20 2306)    ED Course  I have reviewed the triage vital signs and the nursing notes.  Pertinent labs & imaging results that were available during my care of the patient were reviewed by me and considered in my medical decision making (see chart for details).  This patient presents to the Emergency Department with complaint of altered mental status.  This involves an extensive number of treatment options, and is a complaint that carries with it a high risk of complications and morbidity.  The differential diagnosis  includes hypoglycemia vs metabolic encephalopathy vs infection (including cystitis) vs ICH vs stroke vs polypharmacy vs dementia vs other  I ordered, reviewed, and interpreted labs, including trop flat, UA with leuks, bacteria.  BMP and LFT near baseline. I ordered medication IM geodon for sedation I ordered imaging studies which included dg chest, CT head  I personally reviewed the patients ECG which showed sinus rhythm with no acute ischemic findings  This may be acute behavioral disturbance in setting of a UTI.  With allergies to penicillin, I've asked for cipro to be given.  She does not appear to have sepsis.  We are re-attempting CT to rule out ICH or stroke.  She is on eliquis.  If CT imaging is unremarkable, anticipate admission for AMS/UTI treatment.   Clinical Course as of 10/12/20 2330  Tue Oct 12, 2020  2118 Patient is inconsolable, agitated, despite family member at the bedside.  She is speaking nonsensically.  We have been unable thus far to get an x-ray or CT scan.  I talked to her daughter and I now advised that 10 mg IM Geodon be given. [MT]    Clinical Course User Index [MT] Doss Cybulski, Kermit Balo, MD    Final Clinical Impression(s) / ED Diagnoses Final diagnoses:  None    Rx / DC Orders ED Discharge Orders    None       Terald Sleeper, MD 10/12/20 2330

## 2020-10-12 NOTE — Progress Notes (Signed)
Remote pacemaker transmission.   

## 2020-10-12 NOTE — Telephone Encounter (Signed)
Arline Asp called from [facility] stated that pt had an appt with MMM yesterday and had some lab work done which came back negative, but says today pt is being very combative and wants to know if order can be made to be evaluated at hospital.   Please advise and call [facility]  [phone] (ask for Varney Biles, or [place])

## 2020-10-12 NOTE — ED Notes (Signed)
Pt to CT scan, staff reports pt is to uncooperative and combative to complete CT scan or chest xray. Dr. Pryor Ochoa notified.

## 2020-10-12 NOTE — Telephone Encounter (Signed)
Contacted [facility] and advised them that it was ok to send patient to ER to be evaluated due to combativeness and confusion. Order faxed to them.

## 2020-10-12 NOTE — ED Triage Notes (Signed)
Pt brought in EMS from [facility] for aggressive behavior.  Staff reports pt rolled out of front door and then pt was placed in memory care unit.    Pt began combative.  EMS reports that they visited the facility twice today for this pt.  EMS says pt was not combative to EMS.  VSS. Daughter is aware and on the way to ED.

## 2020-10-12 NOTE — ED Notes (Signed)
Daughter, [name], leaving at this time to go home. She would like to be called if patient status changes.

## 2020-10-13 ENCOUNTER — Emergency Department (HOSPITAL_COMMUNITY): Payer: Medicare Other

## 2020-10-13 DIAGNOSIS — R69 Illness, unspecified: Secondary | ICD-10-CM | POA: Diagnosis not present

## 2020-10-13 DIAGNOSIS — R5381 Other malaise: Secondary | ICD-10-CM | POA: Diagnosis not present

## 2020-10-13 DIAGNOSIS — Z7401 Bed confinement status: Secondary | ICD-10-CM | POA: Diagnosis not present

## 2020-10-13 DIAGNOSIS — Z743 Need for continuous supervision: Secondary | ICD-10-CM | POA: Diagnosis not present

## 2020-10-13 LAB — TROPONIN I (HIGH SENSITIVITY)
Troponin I (High Sensitivity): 40 ng/L — ABNORMAL HIGH (ref <18)
Troponin I (High Sensitivity): 38 ng/L — ABNORMAL HIGH (ref <18)

## 2020-10-13 LAB — RESP PANEL BY RT-PCR (FLU A&B, COVID) ARPGX2
Influenza A by PCR: NEGATIVE
Influenza B by PCR: NEGATIVE
SARS Coronavirus 2 by RT PCR: NEGATIVE

## 2020-10-13 MED ORDER — SODIUM CHLORIDE 0.9 % IV BOLUS
500.0000 mL | Freq: Once | INTRAVENOUS | Status: AC
  Administered 2020-10-13: 500 mL via INTRAVENOUS

## 2020-10-13 MED ORDER — CIPROFLOXACIN HCL 500 MG PO TABS: 500.0000 mg | ORAL_TABLET | Freq: Two times a day (BID) | ORAL | 0 refills | Status: DC

## 2020-10-13 MED ORDER — CIPROFLOXACIN HCL 250 MG PO TABS
500.0000 mg | ORAL_TABLET | Freq: Once | ORAL | Status: AC
  Administered 2020-10-13: 500 mg via ORAL
  Filled 2020-10-13: qty 2

## 2020-10-13 NOTE — ED Notes (Signed)
Pt asleep, will continue to monitor.

## 2020-10-13 NOTE — ED Notes (Signed)
Pt assisted to bedside commode.   Pt repositioned back in bed without difficulty and appears to be in NAD. Will continue to monitor.

## 2020-10-13 NOTE — ED Notes (Addendum)
Pt asleep at this time

## 2020-10-13 NOTE — ED Notes (Addendum)
Pt waiting for transport to [facility], per report report has been called to the facility, and pt is to receive Cipro once she is awake

## 2020-10-13 NOTE — ED Provider Notes (Addendum)
Patient presents to the emergency department for evaluation of agitation and confusion.  She was initially worked up by Dr. Renaye Rakers and signed out to me at shift change.   Patient reportedly had a sudden change approximately 1 week ago.  She has been very confused this week but has become progressively more agitated and combative.  EMS was called to the facility a couple of times earlier today because of her agitation, finally transported her to the emergency department earlier today.  Work-up reveals mildly elevated troponins with no EKG changes.  Urinalysis with possible infection, culture pending.  Remainder of work-up negative including remainder of blood work, chest x-ray, CT head.  Daughter reportedly told Dr. Renaye Rakers that she had similar behavior when she had her stroke.  Symptoms have been present for a week and she therefore does not require emergent MRI.  CT head did not show any bleed.  Not sure she even requires an MRI because treatment would likely not change if she has a stroke confirmed.   She has been with me throughout the entire night.  She has mostly slept.  Whenever she was awakened by myself or staff, however, she was cooperative and pleasant.  She has not had any further agitation or combativeness. Discussed with on-call hospitalist, Dr. Thomes Dinning.  He did not feel that the patient required hospitalization at this time.  He recommended performing an additional troponin to make sure that it is not continuing to go up.  Repeat troponin was once again 40, no increase.  He also recommended giving IV fluids, as dehydration can severely affect a 87 year old.  She was given additional hydration and has done well.  I discussed this with the patient's daughter by phone.  She was a little concerned about her going back to [facility], as she did not know what to do if she became agitated again.  I informed her that she simply could be sent back to the emergency department for further evaluation  including [facility] psych evaluation.  At this point, however, I do not think she requires further psychiatric medications and can return safely to [facility].  She will be kept on Cipro.  Urine culture is pending.   Gilda Crease, MD 10/13/20 0401    Gilda Crease, MD 10/13/20 [phone]

## 2020-10-13 NOTE — ED Notes (Signed)
Pt attempting to get out of bed, x 2 assist with multiple attempts to stand up and get out of bed, pt states, "My sister is trying to kill me. You are taking me to the morgue." pt uncooperative, pt reoriented to the situation, sitter at bedside with patient, Charge RN aware. Pt placed in a hallway bed for visualization, will continue to monitor

## 2020-10-13 NOTE — ED Notes (Signed)
Pt assisted to bedside commode. Non-skid socks placed on pt.  Pt repositioned in bed for comfort and denied any other needs at this time. Will continue to monitor.

## 2020-10-13 NOTE — ED Notes (Signed)
Pt spit up ice cream post abx administration, unable to assess amount of medication that the pt received

## 2020-10-13 NOTE — ED Notes (Signed)
Pt assisted to bedside commode.   Pt repositioned in bed for comfort. New brief applied. Pt denied any other needs at this time and appears to be in NAD. Will continue to monitor.

## 2020-10-14 LAB — URINE CULTURE: Culture: 30000 — AB

## 2020-10-17 ENCOUNTER — Encounter (HOSPITAL_COMMUNITY): Payer: Self-pay

## 2020-10-17 ENCOUNTER — Emergency Department (HOSPITAL_COMMUNITY): Payer: Medicare Other

## 2020-10-17 ENCOUNTER — Emergency Department (HOSPITAL_COMMUNITY)
Admission: EM | Admit: 2020-10-17 | Discharge: 2020-10-26 | Disposition: A | Discharge status: Home / Self Care | Payer: Medicare Other | Attending: Emergency Medicine | Admitting: Emergency Medicine

## 2020-10-17 ENCOUNTER — Other Ambulatory Visit: Payer: Self-pay

## 2020-10-17 ENCOUNTER — Other Ambulatory Visit (INDEPENDENT_AMBULATORY_CARE_PROVIDER_SITE_OTHER): Payer: Medicare Other | Admitting: Nurse Practitioner

## 2020-10-17 DIAGNOSIS — I10 Essential (primary) hypertension: Secondary | ICD-10-CM | POA: Diagnosis not present

## 2020-10-17 DIAGNOSIS — G319 Degenerative disease of nervous system, unspecified: Secondary | ICD-10-CM | POA: Diagnosis not present

## 2020-10-17 DIAGNOSIS — I6389 Other cerebral infarction: Secondary | ICD-10-CM | POA: Diagnosis not present

## 2020-10-17 DIAGNOSIS — Z87891 Personal history of nicotine dependence: Secondary | ICD-10-CM | POA: Diagnosis not present

## 2020-10-17 DIAGNOSIS — F22 Delusional disorders: Secondary | ICD-10-CM | POA: Diagnosis not present

## 2020-10-17 DIAGNOSIS — Z950 Presence of cardiac pacemaker: Secondary | ICD-10-CM | POA: Insufficient documentation

## 2020-10-17 DIAGNOSIS — Z8673 Personal history of transient ischemic attack (TIA), and cerebral infarction without residual deficits: Secondary | ICD-10-CM | POA: Diagnosis not present

## 2020-10-17 DIAGNOSIS — Z79899 Other long term (current) drug therapy: Secondary | ICD-10-CM | POA: Diagnosis not present

## 2020-10-17 DIAGNOSIS — R4689 Other symptoms and signs involving appearance and behavior: Secondary | ICD-10-CM | POA: Diagnosis present

## 2020-10-17 DIAGNOSIS — Z7901 Long term (current) use of anticoagulants: Secondary | ICD-10-CM | POA: Diagnosis not present

## 2020-10-17 DIAGNOSIS — R4182 Altered mental status, unspecified: Secondary | ICD-10-CM | POA: Diagnosis present

## 2020-10-17 DIAGNOSIS — M6281 Muscle weakness (generalized): Secondary | ICD-10-CM | POA: Diagnosis not present

## 2020-10-17 DIAGNOSIS — I6782 Cerebral ischemia: Secondary | ICD-10-CM | POA: Diagnosis not present

## 2020-10-17 DIAGNOSIS — Z20822 Contact with and (suspected) exposure to covid-19: Secondary | ICD-10-CM | POA: Diagnosis not present

## 2020-10-17 DIAGNOSIS — Z743 Need for continuous supervision: Secondary | ICD-10-CM | POA: Diagnosis not present

## 2020-10-17 DIAGNOSIS — G9340 Encephalopathy, unspecified: Secondary | ICD-10-CM

## 2020-10-17 DIAGNOSIS — N390 Urinary tract infection, site not specified: Secondary | ICD-10-CM | POA: Diagnosis not present

## 2020-10-17 DIAGNOSIS — R404 Transient alteration of awareness: Secondary | ICD-10-CM | POA: Diagnosis not present

## 2020-10-17 DIAGNOSIS — R410 Disorientation, unspecified: Secondary | ICD-10-CM | POA: Diagnosis not present

## 2020-10-17 LAB — CBC WITH DIFFERENTIAL/PLATELET
Abs Immature Granulocytes: 0.01 10*3/uL (ref 0.00–0.07)
Basophils Absolute: 0.1 10*3/uL (ref 0.0–0.1)
Basophils Relative: 1 %
Eosinophils Absolute: 0 10*3/uL (ref 0.0–0.5)
Eosinophils Relative: 0 %
HCT: 35.5 % — ABNORMAL LOW (ref 36.0–46.0)
Hemoglobin: 11.7 g/dL — ABNORMAL LOW (ref 12.0–15.0)
Immature Granulocytes: 0 %
Lymphocytes Relative: 14 %
Lymphs Abs: 1 10*3/uL (ref 0.7–4.0)
MCH: 30.7 pg (ref 26.0–34.0)
MCHC: 33 g/dL (ref 30.0–36.0)
MCV: 93.2 fL (ref 80.0–100.0)
Monocytes Absolute: 0.7 10*3/uL (ref 0.1–1.0)
Monocytes Relative: 10 %
Neutro Abs: 5.3 10*3/uL (ref 1.7–7.7)
Neutrophils Relative %: 75 %
Platelets: 246 10*3/uL (ref 150–400)
RBC: 3.81 MIL/uL — ABNORMAL LOW (ref 3.87–5.11)
RDW: 15.1 % (ref 11.5–15.5)
WBC: 7.1 10*3/uL (ref 4.0–10.5)
nRBC: 0 % (ref 0.0–0.2)

## 2020-10-17 LAB — URINALYSIS, ROUTINE W REFLEX MICROSCOPIC
Bilirubin Urine: NEGATIVE
Glucose, UA: NEGATIVE mg/dL
Hgb urine dipstick: NEGATIVE
Ketones, ur: 5 mg/dL — AB
Nitrite: NEGATIVE
Protein, ur: NEGATIVE mg/dL
Specific Gravity, Urine: 1.015 (ref 1.005–1.030)
pH: 6 (ref 5.0–8.0)

## 2020-10-17 LAB — TSH: TSH: 2.435 u[IU]/mL (ref 0.350–4.500)

## 2020-10-17 LAB — COMPREHENSIVE METABOLIC PANEL
ALT: 27 U/L (ref 0–44)
AST: 42 U/L — ABNORMAL HIGH (ref 15–41)
Albumin: 3.7 g/dL (ref 3.5–5.0)
Alkaline Phosphatase: 62 U/L (ref 38–126)
Anion gap: 8 (ref 5–15)
BUN: 31 mg/dL — ABNORMAL HIGH (ref 8–23)
CO2: 25 mmol/L (ref 22–32)
Calcium: 9.1 mg/dL (ref 8.9–10.3)
Chloride: 104 mmol/L (ref 98–111)
Creatinine, Ser: 1.17 mg/dL — ABNORMAL HIGH (ref 0.44–1.00)
GFR, Estimated: 42 mL/min — ABNORMAL LOW (ref >60)
Glucose, Bld: 104 mg/dL — ABNORMAL HIGH (ref 70–99)
Potassium: 5 mmol/L (ref 3.5–5.1)
Sodium: 137 mmol/L (ref 135–145)
Total Bilirubin: 1.2 mg/dL (ref 0.3–1.2)
Total Protein: 6.8 g/dL (ref 6.5–8.1)

## 2020-10-17 LAB — FOLATE: Folate: 25 ng/mL (ref >5.9)

## 2020-10-17 LAB — RESP PANEL BY RT-PCR (FLU A&B, COVID) ARPGX2
Influenza A by PCR: NEGATIVE
Influenza B by PCR: NEGATIVE
SARS Coronavirus 2 by RT PCR: NEGATIVE

## 2020-10-17 LAB — VITAMIN B12: Vitamin B-12: 450 pg/mL (ref 180–914)

## 2020-10-17 MED ORDER — APIXABAN 2.5 MG PO TABS
2.5000 mg | ORAL_TABLET | Freq: Two times a day (BID) | ORAL | Status: DC
  Administered 2020-10-18 – 2020-10-26 (×11): 2.5 mg via ORAL
  Filled 2020-10-17 (×15): qty 1

## 2020-10-17 MED ORDER — ENALAPRIL MALEATE 5 MG PO TABS
5.0000 mg | ORAL_TABLET | Freq: Every day | ORAL | Status: DC
  Administered 2020-10-19 – 2020-10-26 (×6): 5 mg via ORAL
  Filled 2020-10-17 (×11): qty 1

## 2020-10-17 MED ORDER — RISPERIDONE 1 MG PO TBDP: 2.0000 mg | ORAL_TABLET | Freq: Three times a day (TID) | ORAL | Status: DC | PRN

## 2020-10-17 MED ORDER — AMLODIPINE BESYLATE 5 MG PO TABS
5.0000 mg | ORAL_TABLET | Freq: Every day | ORAL | Status: DC
  Administered 2020-10-19 – 2020-10-26 (×6): 5 mg via ORAL
  Filled 2020-10-17 (×9): qty 1

## 2020-10-17 MED ORDER — LORAZEPAM 1 MG PO TABS
1.0000 mg | ORAL_TABLET | ORAL | Status: AC | PRN
  Administered 2020-10-19: 1 mg via ORAL
  Filled 2020-10-17: qty 1

## 2020-10-17 MED ORDER — ZINC SULFATE 220 (50 ZN) MG PO CAPS
220.0000 mg | ORAL_CAPSULE | Freq: Every day | ORAL | Status: DC
  Administered 2020-10-21 – 2020-10-26 (×5): 220 mg via ORAL
  Filled 2020-10-17 (×9): qty 1

## 2020-10-17 MED ORDER — CALCIUM CARBONATE-VITAMIN D 500-200 MG-UNIT PO TABS
1.0000 | ORAL_TABLET | Freq: Every day | ORAL | Status: DC
  Administered 2020-10-19 – 2020-10-26 (×5): 1 via ORAL
  Filled 2020-10-17 (×8): qty 1

## 2020-10-17 MED ORDER — HALOPERIDOL LACTATE 5 MG/ML IJ SOLN
5.0000 mg | Freq: Once | INTRAMUSCULAR | Status: AC
  Administered 2020-10-17: 5 mg via INTRAMUSCULAR
  Filled 2020-10-17: qty 1

## 2020-10-17 MED ORDER — CIPROFLOXACIN HCL 250 MG PO TABS
250.0000 mg | ORAL_TABLET | Freq: Two times a day (BID) | ORAL | Status: AC
  Administered 2020-10-18 – 2020-10-19 (×4): 250 mg via ORAL
  Filled 2020-10-17 (×7): qty 1

## 2020-10-17 MED ORDER — DONEPEZIL HCL 5 MG PO TABS
5.0000 mg | ORAL_TABLET | Freq: Every day | ORAL | Status: DC
  Administered 2020-10-18 – 2020-10-24 (×5): 5 mg via ORAL
  Filled 2020-10-17 (×6): qty 1

## 2020-10-17 MED ORDER — FERROUS SULFATE 325 (65 FE) MG PO TABS
325.0000 mg | ORAL_TABLET | Freq: Every day | ORAL | Status: DC
  Administered 2020-10-18 – 2020-10-26 (×7): 325 mg via ORAL
  Filled 2020-10-17 (×9): qty 1

## 2020-10-17 MED ORDER — PANTOPRAZOLE SODIUM 40 MG PO TBEC
40.0000 mg | DELAYED_RELEASE_TABLET | Freq: Every day | ORAL | Status: DC
  Administered 2020-10-19 – 2020-10-26 (×6): 40 mg via ORAL
  Filled 2020-10-17 (×9): qty 1

## 2020-10-17 MED ORDER — ZIPRASIDONE MESYLATE 20 MG IM SOLR: 20.0000 mg | INTRAMUSCULAR | Status: DC | PRN

## 2020-10-17 MED ORDER — RISPERIDONE 1 MG PO TBDP
1.0000 mg | ORAL_TABLET | Freq: Three times a day (TID) | ORAL | Status: DC | PRN
  Filled 2020-10-17: qty 1

## 2020-10-17 MED ORDER — LORAZEPAM 1 MG PO TABS: 1.0000 mg | ORAL_TABLET | ORAL | Status: DC | PRN

## 2020-10-17 MED ORDER — SODIUM CHLORIDE 0.9 % IV BOLUS
1000.0000 mL | Freq: Once | INTRAVENOUS | Status: AC
  Administered 2020-10-17: 1000 mL via INTRAVENOUS

## 2020-10-17 MED ORDER — ASCORBIC ACID 500 MG PO TABS
500.0000 mg | ORAL_TABLET | Freq: Every day | ORAL | Status: DC
  Administered 2020-10-19 – 2020-10-26 (×6): 500 mg via ORAL
  Filled 2020-10-17 (×9): qty 1

## 2020-10-17 MED ORDER — ACETAMINOPHEN 325 MG PO TABS: 650.0000 mg | ORAL_TABLET | ORAL | Status: DC | PRN

## 2020-10-17 MED ORDER — CIPROFLOXACIN IN D5W 200 MG/100ML IV SOLN
400.0000 mg | Freq: Once | INTRAVENOUS | Status: AC
  Administered 2020-10-17: 400 mg via INTRAVENOUS
  Filled 2020-10-17: qty 200

## 2020-10-17 MED ORDER — STERILE WATER FOR INJECTION IJ SOLN
INTRAMUSCULAR | Status: AC
  Filled 2020-10-17: qty 10

## 2020-10-17 MED ORDER — ZIPRASIDONE MESYLATE 20 MG IM SOLR
20.0000 mg | INTRAMUSCULAR | Status: AC | PRN
  Administered 2020-10-17: 20 mg via INTRAMUSCULAR
  Filled 2020-10-17: qty 20

## 2020-10-17 MED ORDER — PRAVASTATIN SODIUM 10 MG PO TABS
20.0000 mg | ORAL_TABLET | Freq: Every day | ORAL | Status: DC
  Filled 2020-10-17: qty 2

## 2020-10-17 NOTE — ED Notes (Addendum)
Telepsych attempted with provider, pt refused to speak with the provider. Provider stated they will retry at a later time. MD notified.

## 2020-10-17 NOTE — ED Notes (Addendum)
This nurse to bedside with tech as pt was putting her legs through bed rails, trying to climb over the bed rails, swinging arms at staff, attempting to kick and bite them. Staff attempted multiple methods to redirect and calm her without success. Medicated per PRN. Staff had to hold pt's wrists and legs to prevent her from harming herself. Pads on rails to prevent pt from placing extremities through them. Fall pads on floor for safety. Tech remains on 1:1 continuous observation for safety.

## 2020-10-17 NOTE — ED Notes (Signed)
Pt voided in bed pan with no issues. Pt being calm and cooperative with staff.

## 2020-10-17 NOTE — ED Notes (Signed)
Pt in bed, pt calm; however, when attempting to get vital signs or interacting with pt she because combative and attempts to hit staff.  Pt on cardiac monitor, sinus rhythm.

## 2020-10-17 NOTE — ED Notes (Signed)
Unable to assess VS due to patient pulling off monitor leads, BP cuff and pulse ox.

## 2020-10-17 NOTE — Progress Notes (Signed)
   Virtual Visit  Note Due to COVID-19 pandemic this visit was conducted virtually. This visit type was conducted due to national recommendations for restrictions regarding the COVID-19 Pandemic (e.g. social distancing, sheltering in place) in an effort to limit this patient's exposure and mitigate transmission in our community. All issues noted in this document were discussed and addressed.  A physical exam was not performed with this format.  I connected with Allyn Kenner Savona's caregiver Olegario Messier at [place] [facility] on 10/17/20 at 04:20 am  by telephone and verified that I am speaking with the correct person using two identifiers. YAREXI PAWLICKI is currently located at [place] point and Olegario Messier is currently with patient during visit. The provider, Daryll Drown, NP is located in their office at time of visit.  I discussed the limitations, risks, security and privacy concerns of performing an evaluation and management service by telephone and the availability of in person appointments. I also discussed with the patient that there may be a patient responsible charge related to this service. The patient expressed understanding and agreed to proceed.   History and Present Illness:  HPI  Patient  is a 87 year old female positive for UTI in the past 5 days, patient was first seen at clinic and was negative per Olegario Messier after urinalysis, patient became worse and was taken to [facility] and urinalysis was repeated and followed with urine culture which came back positive. Patient was  prescribed Cipro and sent back to [place] [facility].  Today patient is worse, Olegario Messier reports patient is hallucinating, combative, agitated , increase confusion, and trying to leave the building. Patient is spitting out all medications administered and is yelling at staff.  Denies fever, nausea or vomiting.    Review of Systems  Constitutional: Negative for chills and fever.  Respiratory: Negative for cough.   Cardiovascular: Negative for  palpitations.  Gastrointestinal: Negative for abdominal pain, nausea and vomiting.  Genitourinary:       Positive for UTI  All other systems reviewed and are negative.    Observations/Objective: Patient asleep and does not sound to be in distress during the time of the call.  Assessment and Plan:  Call hospital ambulance and have patient treated in the hospital as soon as possible.  Patients labs from 5 days ago was very abnormal including low sodium and creatinine   Follow Up Instructions:  follow up as needed   I discussed the assessment and treatment plan with the patient. The patient was provided an opportunity to ask questions and all were answered. The patient agreed with the plan and demonstrated an understanding of the instructions.   The patient was advised to call back or seek an in-person evaluation if the symptoms worsen or if the condition fails to improve as anticipated.  The above assessment and management plan was discussed with the patient. The patient verbalized understanding of and has agreed to the management plan. Patient is aware to call the clinic if symptoms persist or worsen. Patient is aware when to return to the clinic for a follow-up visit. Patient educated on when it is appropriate to go to the emergency department.   Time call ended:  04:30am  I provided 10 minutes of  non face-to-face time during this encounter.    Daryll Drown, NP

## 2020-10-17 NOTE — ED Provider Notes (Addendum)
[facility]ERGENCY DEPARTMENT Provider Note   CSN: [id number] Arrival date & time: 10/17/20  0815     History Chief Complaint  Patient presents with  . Urinary Tract Infection    Kelsey Thompson is a 87 y.o. female.  HPI    87 year old female brought into the ER with chief complaint of altered mental status and hostile behavior.  Level 5 caveat for mental status change.  I called patient's nursing home on 2 separate occasions.  Nobody picked of the phone call and there was no answering service. I then spoke with patient's daughter Ms. Flint.  She reported that she was at the magistrate's office, wanting to get IVC and power of attorney paperwork completed.  The daughter reports that patient has been residing at the assisted living part of the facility for quite some time.  2 weeks ago patient started getting confused.  She was getting paranoid and complaining that the place will get bombarded.  She was seen in the ER few days back and was diagnosed with UTI.  Despite taking the antibiotics, she has not improved.  In fact patient [facility], screaming for the guard to take her away.  This type of behavior is unusual for the patient.  They are concerned that given her history of prior stroke and A. fib, she might have suffered through a new stroke.  Past Medical History:  Diagnosis Date  . Arthritis   . Bradycardia   . GERD (gastroesophageal reflux disease)   . Hiatal hernia   . Hypercholesterolemia   . Hypertension   . Pacemaker   . Stroke [facility](HCC)     Patient Active Problem List   Diagnosis Date Noted  . Hemiparesis affecting left side as late effect of cerebrovascular accident (CVA) (HCC) 05/19/2019  . Paroxysmal atrial fibrillation (HCC) 01/21/2019  . Hyperlipidemia LDL goal <70 01/21/2019  . Embolic stroke (HCC) R brain s/p tPA d/t AF 01/16/2019  . Acute pain of left knee 07/17/2014  . Sinoatrial node  dysfunction (HCC) 09/26/2011  . Cardiac pacemaker in situ 11/19/2008  . Essential hypertension 11/07/2008  . BRADYCARDIA 11/07/2008  . GERD 11/07/2008  . Diaphragmatic hernia 11/07/2008    Past Surgical History:  Procedure Laterality Date  . APPENDECTOMY    . CESAREAN SECTION    . PACEMAKER INSERTION    . TONSILLECTOMY       OB History   No obstetric history on file.     Family History  Problem Relation Age of Onset  . Heart failure Mother   . Diabetes Father   . Heart attack Father     Social History   Tobacco Use  . Smoking status: Former Smoker    Types: Cigarettes    Quit date: 06/30/1981    Years since quitting: 39.3  . Smokeless tobacco: Never Used  Vaping Use  . Vaping Use: Never used  Substance Use Topics  . Alcohol use: No    Alcohol/week: 0.0 standard drinks  . Drug use: No    Home Medications Prior to Admission medications   Medication Sig Start Date End Date Taking? Authorizing Provider  acetaminophen (TYLENOL) 500 MG tablet Take 500 mg by mouth every 6 (six) hours as needed for mild pain or moderate pain.    [provider]  amLODipine (NORVASC) 5 MG tablet Take 1 tablet (5 mg total) by mouth daily. 04/07/19   Mechele ClaudeStacks, [place], MD  apixaban (  ELIQUIS) 2.5 MG TABS tablet Take 1 tablet (2.5 mg total) by mouth 2 (two) times daily. 04/07/19   Mechele Claude, MD  calcium-vitamin D (OSCAL WITH D) 500-200 MG-UNIT per tablet Take 1 tablet by mouth daily.    [provider]  ciprofloxacin (CIPRO) 500 MG tablet Take 1 tablet (500 mg total) by mouth 2 (two) times daily. One po bid x 7 days 10/13/20   Gilda Crease, MD  clindamycin (CLEOCIN) 150 MG capsule Take 150 mg by mouth every 6 (six) hours. Patient not taking: Reported on 10/12/2020 08/17/20   [provider]  donepezil (ARICEPT) 5 MG tablet Take 1 tablet (5 mg total) by mouth at bedtime. 05/19/19   Mechele Claude, MD  enalapril (VASOTEC) 5 MG tablet Take 1 tablet (5 mg total)  by mouth daily. 04/07/19   Mechele Claude, MD  ferrous sulfate 325 (65 FE) MG tablet Take 1 tablet (325 mg total) by mouth daily with breakfast. 04/07/19   Mechele Claude, MD  omeprazole (PRILOSEC) 20 MG capsule Take 1 capsule (20 mg total) by mouth daily. 04/07/19   Mechele Claude, MD  pravastatin (PRAVACHOL) 20 MG tablet Take 1 tablet (20 mg total) by mouth daily at 6 PM. 04/07/19   Mechele Claude, MD  Propylene Glycol (SYSTANE COMPLETE OP) Apply 1 drop to eye 4 (four) times daily.    [provider]  QUEtiapine (SEROQUEL) 25 MG tablet Take 1 tablet (25 mg total) by mouth at bedtime. 05/19/19   Mechele Claude, MD  triamcinolone cream (KENALOG) 0.1 % Apply 1 application topically 3 (three) times daily. Avoid face and genitalia Patient not taking: No sig reported 05/19/19   Mechele Claude, MD  vitamin C (ASCORBIC ACID) 500 MG tablet Take 500 mg by mouth daily.    [provider]  zinc sulfate 220 (50 Zn) MG capsule Take 1 capsule (220 mg total) by mouth daily. 04/07/19   Mechele Claude, MD    Allergies    Horse-derived products, Penicillins, and Tetanus antitoxin  Review of Systems   Review of Systems  Unable to perform ROS: Mental status change    Physical Exam Updated Vital Signs BP 132/60   Pulse 72   Temp 98.2 F (36.8 C) (Oral)   Resp (!) 21   Ht 5\' 3"  (1.6 m)   Wt 55 kg   SpO2 100%   BMI 21.48 kg/m   Physical Exam Vitals and nursing note reviewed.  Constitutional:      Appearance: She is well-developed.  HENT:     Head: Normocephalic.  Eyes:     Extraocular Movements: Extraocular movements intact.  Cardiovascular:     Rate and Rhythm: Normal rate.  Pulmonary:     Effort: Pulmonary effort is normal.  Abdominal:     General: Bowel sounds are normal.  Musculoskeletal:     Cervical back: Normal range of motion and neck supple.  Skin:    General: Skin is warm and dry.  Neurological:     General: No focal deficit present.     Mental Status: She is  alert.     Comments: Moving all 4 extremities     ED Results / Procedures / Treatments   Labs (all labs ordered are listed, but only abnormal results are displayed) Labs Reviewed  COMPREHENSIVE METABOLIC PANEL - Abnormal; Notable for the following components:      Result Value   Glucose, Bld 104 (*)    BUN 31 (*)    Creatinine, Ser  1.17 (*)    AST 42 (*)    GFR, Estimated 42 (*)    All other components within normal limits  CBC WITH DIFFERENTIAL/PLATELET - Abnormal; Notable for the following components:   RBC 3.81 (*)    Hemoglobin 11.7 (*)    HCT 35.5 (*)    All other components within normal limits  URINALYSIS, ROUTINE W REFLEX MICROSCOPIC - Abnormal; Notable for the following components:   Ketones, ur 5 (*)    Leukocytes,Ua MODERATE (*)    Bacteria, UA RARE (*)    All other components within normal limits  RESP PANEL BY RT-PCR (FLU A&B, COVID) ARPGX2  URINE CULTURE  VITAMIN B12  TSH  VITAMIN B1  FOLATE    EKG EKG Interpretation  Date/Time:  Sunday October 17 2020 10:10:09 EDT Ventricular Rate:  79 PR Interval:    QRS Duration: 102 QT Interval:  393 QTC Calculation: 439 R Axis:   91 Text Interpretation: Atrial fibrillation Anteroseptal infarct, age indeterminate Lateral leads are also involved No acute changes No significant change since last tracing Confirmed by Derwood Kaplan [phone]) on 10/17/2020 10:36:19 AM   Radiology CT Head Wo Contrast  Result Date: 10/17/2020 CLINICAL DATA:  Delirium EXAM: CT HEAD WITHOUT CONTRAST TECHNIQUE: Contiguous axial images were obtained from the base of the skull through the vertex without intravenous contrast. COMPARISON:  October 13, 2020 FINDINGS: Brain: No evidence of acute infarction, hemorrhage, hydrocephalus, extra-axial collection or mass lesion/mass effect. There is chronic diffuse atrophy. Chronic bilateral periventricular white matter small vessel ischemic changes are identified. Old lacunar infarctions are identified in  the basal ganglia unchanged. Vascular: No hyperdense vessel is noted. Skull: Normal. Negative for fracture or focal lesion. Sinuses/Orbits: No acute finding. Other: None. IMPRESSION: 1. No focal acute intracranial abnormality identified. 2. Chronic diffuse atrophy. Chronic bilateral periventricular white matter small vessel ischemic change. Electronically Signed   By: Sherian Rein M.D.   On: 10/17/2020 12:54    Procedures .Critical Care Performed by: Derwood Kaplan, MD Authorized by: Derwood Kaplan, MD   Critical care provider statement:    Critical care time (minutes):  98   Critical care was necessary to treat or prevent imminent or life-threatening deterioration of the following conditions:  CNS failure or compromise   Critical care was time spent personally by me on the following activities:  Discussions with consultants, evaluation of patient's response to treatment, examination of patient, ordering and performing treatments and interventions, ordering and review of laboratory studies, ordering and review of radiographic studies, pulse oximetry, re-evaluation of patient's condition, obtaining history from patient or surrogate and review of old charts     Medications Ordered in ED Medications  acetaminophen (TYLENOL) tablet 650 mg (has no administration in time range)  amLODipine (NORVASC) tablet 5 mg (5 mg Oral Not Given 10/17/20 1251)  apixaban (ELIQUIS) tablet 2.5 mg (2.5 mg Oral Not Given 10/17/20 1253)  calcium-vitamin D (OSCAL WITH D) 500-200 MG-UNIT per tablet 1 tablet (1 tablet Oral Not Given 10/17/20 1252)  donepezil (ARICEPT) tablet 5 mg (has no administration in time range)  enalapril (VASOTEC) tablet 5 mg (5 mg Oral Not Given 10/17/20 1253)  ferrous sulfate tablet 325 mg (has no administration in time range)  pantoprazole (PROTONIX) EC tablet 40 mg (40 mg Oral Not Given 10/17/20 1252)  pravastatin (PRAVACHOL) tablet 20 mg (has no administration in time range)  zinc sulfate  capsule 220 mg (220 mg Oral Not Given 10/17/20 1252)  ascorbic acid (VITAMIN C) tablet 500  mg (500 mg Oral Not Given 10/17/20 1252)  risperiDONE (RISPERDAL M-TABS) disintegrating tablet 1 mg (has no administration in time range)    And  LORazepam (ATIVAN) tablet 1 mg (has no administration in time range)    And  ziprasidone (GEODON) injection 20 mg (has no administration in time range)  ciprofloxacin (CIPRO) tablet 250 mg (has no administration in time range)  haloperidol lactate (HALDOL) injection 5 mg (5 mg Intramuscular Given 10/17/20 0914)  sodium chloride 0.9 % bolus 1,000 mL (0 mLs Intravenous Stopped 10/17/20 1129)  ciprofloxacin (CIPRO) IVPB 400 mg (0 mg Intravenous Stopped 10/17/20 1400)    ED Course  I have reviewed the triage vital signs and the nursing notes.  Pertinent labs & imaging results that were available during my care of the patient were reviewed by me and considered in my medical decision making (see chart for details).  Clinical Course as of 10/17/20 1445  Sun Oct 17, 2020  1236 Patient's labs are reassuring.  Perhaps mild dehydration.  We will give her some IV fluids  [AN]  1237 Spoke with Dr. Ezzie Dural -neurology.  He recommends getting CT head and additional labs to further look into organic cause.  His suspicion is that this is unlikely to be large frontal stroke. [AN]  1237 Discussed case with Dr. Laural Benes, medicine.  They are apprehensive about admitting the patient as they suspect that primarily she might have a psych issue.   I will consult TTS. It is entirely possible that she has vascular dementia or new psychosis. [AN]  1238 Patient is calmer after she received Haldol [AN]  1315 Ms. Flynt has been updated about neuro and medicine recs. She is comfortable w/ the plan for psych eval. [AN]  1441 Ms. Flint is medically cleared for psych evaluation [AN]    Clinical Course User Index [AN] Derwood Kaplan, MD   MDM Rules/Calculators/A&P                          DDx  includes: ICH / Stroke ACS Sepsis syndrome Infection - UTI/Pneumonia Encephalopathy -toxic versus metabolic Electrolyte abnormality Drug overdose DKA Metabolic disorders including thyroid disorders, adrenal insufficiency Cancer of unknown origin / paraneoplastic process.  87 year old female brought into the ER with chief complaint of mental status change.  It appears that she has declined over the last 2 weeks.  Patient does not carry a diagnosis of dementia.  She has been aggressive, hostile and paranoid at the facility now.  Patient was seen in the ER recently.  She was treated with antibiotics for suspected UTI.  Those urine cultures are negative.  She also had a CT scan of her brain that did not show any acute findings.  Family is concerned for stroke.  Patient has no focal deficits.  With her having a pacemaker, she cannot have MRI.  I will consult teleneurology to see if they think that patient has an organic brain issue.  They have given me consent to give her Haldol.  Besides a stroke, other possibility includes toxic and metabolic encephalopathy.  Patient does not have any significant rigidity or tremors.  It seems like the only SSRI she has is Seroquel.  She does get Aricept, which makes me think that she likely does have dementia as a diagnosis.   Final Clinical Impression(s) / ED Diagnoses Final diagnoses:  Acute encephalopathy  Paranoia (psychosis) (HCC)    Rx / DC Orders ED Discharge Orders  None       Derwood Kaplan, MD 10/17/20 1321    Derwood Kaplan, MD 10/17/20 1445

## 2020-10-17 NOTE — ED Notes (Signed)
Pt asked if she would take her evening medication. Explained the importance of the medication. Pt refused, stating "just let me die peacefully, I don't want it." No attempts at self-harm, pt is otherwise resting comfortably in bed. Sitter on 1:1 observation.

## 2020-10-17 NOTE — ED Triage Notes (Signed)
Pt to er room number 5 via ems, per ems pt is from [facility], states that she has been dx with a uti and pt has stopped taking her abx, pt presents to the er stating that she needs to urinate.  Pt awake, resps even and unlabored. Per ems pt did not want to go to the er.

## 2020-10-17 NOTE — ED Notes (Signed)
Spelling of Patient name is incorrect on IVC paperwork called Magistrate Daphine Deutscher and was inform to fix and sign per Cristine Polio on IVC paperwork.

## 2020-10-17 NOTE — ED Notes (Signed)
Per MD pt is medically cleared.

## 2020-10-17 NOTE — ED Notes (Signed)
Assisted patient to bedpan. Put a clean brief on patient

## 2020-10-17 NOTE — BH Assessment (Signed)
Clinician made contact w/ pt via the Tele-Assessment machine in an attempt to complete pt's MH Assessment. Pt repeated over and over, "I don't want to talk to her," and "I have nothing to say to her." Pt ignored the questions posed by clinician. TTS will attempt the assessment at a later time.

## 2020-10-17 NOTE — ED Notes (Signed)
Pt currently asleep, RR even and nonlabored. No s/s of distress. Bed low and locked. Sitter remains on 1:1 continuous observation for safety. Bilateral hand mittens in place.

## 2020-10-17 NOTE — ED Triage Notes (Signed)
Pt confused and attempting to hit and bite staff, pt moving all extremities. Unable to sign for mse; however, md is at bedside evaluating pt.  Unable to review screening questions due to pt's confusion.

## 2020-10-17 NOTE — BH Assessment (Signed)
Contacted RN to determine if Pt is able to participate in TTS tele-assessment. Pt has been agitated and given medication and is currently unable to participate in tele-assessment. [facility] staff will contact TTS when Pt is able to participate.   Pamalee Leyden, [facility], [facility] Triage Specialist [phone]

## 2020-10-18 MED ORDER — ZIPRASIDONE MESYLATE 20 MG IM SOLR: 20.0000 mg | Freq: Once | INTRAMUSCULAR | Status: DC

## 2020-10-18 MED ORDER — ZIPRASIDONE MESYLATE 20 MG IM SOLR
10.0000 mg | Freq: Once | INTRAMUSCULAR | Status: AC
  Administered 2020-10-18: 10 mg via INTRAMUSCULAR
  Filled 2020-10-18: qty 20

## 2020-10-18 MED ORDER — STERILE WATER FOR INJECTION IJ SOLN
INTRAMUSCULAR | Status: AC
  Administered 2020-10-18: 1.2 mL
  Filled 2020-10-18: qty 10

## 2020-10-18 NOTE — BH Assessment (Signed)
Comprehensive Clinical Assessment (CCA) Note  10/18/2020 Kelsey CollumLillie P Thompson [id number]   Disposition Kelsey ConnJason Berry, NP, patient meets inpatient criteria. Geropsychiatry is recommended.   The patient demonstrates the following risk factors for suicide: Chronic risk factors for suicide include: N/A. Acute risk factors for suicide include: N/A. Protective factors for this patient include: positive social support and responsibility to others (children, family). Considering these factors, the overall suicide risk at this point appears to be high. Patient is not appropriate for outpatient follow up.  Flowsheet Row ED from 10/12/2020 [facility]  C-SSRS RISK CATEGORY No Risk     1:1 recommended  Kelsey EdwardsLillie Thompson is a 87 year old female presenting voluntarily to [facility] from [facility] due to altered mental status and hostile behaviors per staff at facility. When asked, can you tell me where you are, patient stated "at a friends house". When asked, who brought you to ED and her current living situation, patient stated "I don't know" to both. Patient then answered "Why are you asking me that" to multiple questions from assessment.   Patient gave permission to TTS clinician to contact daughter, Kelsey DeedsSusan Thompson [phone], also POA. Kelsey PikesSusan reported onset of patients bizarre erratic behaviors was 2 weeks ago, when patient was sent to ED and diagnosed with UTI. Patient was on antibiotics for UTI, however she has not improved. Kelsey PikesSusan reported receiving a call at 4:30am on Easter Sunday stating she "walked out of a service door and was found under a bush calling for Jesus to take her". Kelsey PikesSusan reported that [facility] has [facility] for approx 1 year. Kelsey PikesSusan reported that patient is getting paranoid and complaining that the place will get bombed. Kelsey PikesSusan reported that these erratic behaviors are unusual for patient.   Chief Complaint:  Chief Complaint   Patient presents with  . Urinary Tract Infection   Visit Diagnosis: Psychosis Unspecified  CCA Screening, Triage and Referral (STR)  Patient Reported Information How did you hear about us? No data recorded Referral name: No data recorded Referral phone number: No data recorded  Whom do you see for routine medical problems? No data recorded Practice/Facility Name: No data recorded Practice/Facility Phone Number: No data recorded Name of Contact: No data recorded Contact Number: No data recorded Contact Fax Number: No data recorded Prescriber Name: No data recorded Prescriber Address (if known): No data recorded  What Is the Reason for Your Visit/Call Today? No data recorded How Long Has This Been Causing You Problems? No data recorded What Do You Feel Would Help You the Most Today? No data recorded  Have You Recently Been in Any Inpatient Treatment (Hospital/Detox/Crisis Center/28-Day Program)? No data recorded Name/Location of Program/Hospital:No data recorded How Long Were You There? No data recorded When Were You Discharged? No data recorded  [facility] You [facility]? No data recorded Who Do You See at [facility]? No data recorded  Have You Recently Had Any Thoughts About Hurting Yourself? No data recorded Are You Planning to Commit Suicide/Harm Yourself At This time? No data recorded  Have you Recently Had Thoughts About Hurting Someone Kelsey Thompson? No data recorded Explanation: No data recorded  Have You Used Any Alcohol or Drugs in the Past 24 Hours? No data recorded How Long Ago Did You Use Drugs or Alcohol? No data recorded What Did You Use and How Much? No data recorded  Do You Currently Have a Therapist/Psychiatrist? No data recorded Name of Therapist/Psychiatrist:  No data recorded  Have You Been Recently Discharged From Any Office Practice or Programs? No data recorded Explanation of Discharge From Practice/Program: No data  recorded   CCA Screening Triage Referral Assessment Type of Contact: No data recorded Is this Initial or Reassessment? No data recorded Date Telepsych consult ordered in [facility]:  No data recorded Time Telepsych consult ordered in [facility]:  No data recorded  Patient Reported Information Reviewed? No data recorded Patient Left Without Being Seen? No data recorded Reason for Not Completing Assessment: No data recorded  Collateral Involvement: No data recorded  Does Patient Have a Court Appointed Legal Guardian? No data recorded Name and Contact of Legal Guardian: No data recorded If Minor and Not Living with Parent(s), Who has Custody? No data recorded Is CPS involved or ever been involved? No data recorded Is APS involved or ever been involved? No data recorded  Patient Determined To Be At Risk for Harm To Self or Others Based on Review of Patient Reported Information or Presenting Complaint? No data recorded Method: No data recorded Availability of Means: No data recorded Intent: No data recorded Notification Required: No data recorded Additional Information for Danger to Others Potential: No data recorded Additional Comments for Danger to Others Potential: No data recorded Are There Guns or Other Weapons in Your Home? No data recorded Types of Guns/Weapons: No data recorded Are These Weapons Safely Secured?                            No data recorded Who Could Verify You Are Able To Have These Secured: No data recorded Do You Have any Outstanding Charges, Pending Court Dates, Parole/Probation? No data recorded Contacted To Inform of Risk of Harm To Self or Others: No data recorded  Location of Assessment: No data recorded  Does Patient Present under Involuntary Commitment? No data recorded IVC Papers Initial File Date: No data recorded  [place] of Residence: No data recorded  Patient Currently Receiving the Following Services: No data recorded  Determination of Need: No data  recorded  Options For Referral: No data recorded  CCA Biopsychosocial Intake/Chief Complaint:  UTI and eratic behaviors  Current Symptoms/Problems: uta  Patient Reported Schizophrenia/Schizoaffective Diagnosis in Past: No data recorded  Strengths: uta  Preferences: uta  Abilities: uta  Type of Services Patient Feels are Needed: uta  Initial Clinical Notes/Concerns: No data recorded  Mental Health Symptoms Depression:  Hopelessness   Duration of Depressive symptoms: Less than two weeks   Mania:  None (uta)   Anxiety:   Restlessness   Psychosis:  None   Duration of Psychotic symptoms: No data recorded  Trauma:  -- (stroke 2020)   Obsessions:  -- (uta)   Compulsions:  Poor Insight   Inattention:  N/A   Hyperactivity/Impulsivity:  N/A   Oppositional/Defiant Behaviors:  N/A   Emotional Irregularity:  N/A   Other Mood/Personality Symptoms:  No data recorded   Mental Status Exam Appearance and self-care  Stature:  Average   Weight:  Average weight   Clothing:  Neat/clean   Grooming:  Normal   Cosmetic use:  None   Posture/gait:  Normal   Motor activity:  Not Remarkable   Sensorium  Attention:  Distractible   Concentration:  No data recorded  Orientation:  Person   Recall/memory:  Defective in Immediate   Affect and Mood  Affect:  Anxious   Mood:  Anxious   Relating  Eye contact:  Normal   Facial expression:  Sad; Anxious   Attitude toward examiner:  Cooperative   Thought and Language  Speech flow: Soft   Thought content:  No data recorded  Preoccupation:  Other (Comment) (patient was trying to escape out of gloves)   Hallucinations:  None   Organization:  No data recorded  [employer] of Knowledge:  No data recorded  Intelligence:  Average   Abstraction:  No data recorded  Judgement:  Poor   Reality Testing:  No data recorded  Insight:  Poor   Decision Making:  Impulsive   Social Functioning  Social  Maturity:  No data recorded  Social Judgement:  No data recorded  Stress  Stressors:  -- Rich Reining)   Coping Ability:  -- Rich Reining)   Skill Deficits:  Self-control   Supports:  Family; Friends/Service system    Religion: Religion/Spirituality Are You A Religious Person?:  Industrial/product designer)  Leisure/Recreation: Leisure / Recreation Do You Have Hobbies?:  Rich Reining)  Exercise/Diet: Exercise/Diet Do You Exercise?:  (uta) Do You Follow a Special Diet?:  (uta) Do You Have Any Trouble Sleeping?: No  CCA Employment/Education Employment/Work Situation: Employment / Work Situation Employment situation: Retired Psychologist, clinical job has been impacted by current illness: No  Education: Education Is Patient Currently Attending School?: No Did Garment/textile technologist From [employer]?: Yes  CCA Family/Childhood History Family and Relationship History: Family history Marital status: Widowed Widowed, when?: since 1999 Does patient have children?: Yes How many children?: 3 How is patient's relationship with their children?: good, 2 deciest and 1 living  Childhood History:  Childhood History Description of patient's relationship with caregiver when they were a child: uta Patient's description of current relationship with people who raised him/her: uta How were you disciplined when you got in trouble as a child/adolescent?: uta Does patient have siblings?:  Rich Reining) Did patient suffer any verbal/emotional/physical/sexual abuse as a child?:  (uta) Did patient suffer from severe childhood neglect?:  (uta) Has patient ever been sexually abused/assaulted/raped as an adolescent or adult?:  (uta) Was the patient ever a victim of a crime or a disaster?:  (uta) Witnessed domestic violence?:  (uta) Has patient been affected by domestic violence as an adult?:  Industrial/product designer)  Child/Adolescent Assessment:   CCA Substance Use Alcohol/Drug Use: Alcohol / Drug Use Pain Medications: see MAR Prescriptions: see MAR Over the Counter: see  MAR History of alcohol / drug use?: No history of alcohol / drug abuse   ASAM's:  Six Dimensions of Multidimensional Assessment  Dimension 1:  Acute Intoxication and/or Withdrawal Potential:      Dimension 2:  Biomedical Conditions and Complications:      Dimension 3:  Emotional, Behavioral, or Cognitive Conditions and Complications:     Dimension 4:  Readiness to Change:     Dimension 5:  Relapse, Continued use, or Continued Problem Potential:     Dimension 6:  Recovery/Living Environment:     ASAM Severity Score:    ASAM Recommended Level of Treatment:     Substance use Disorder (SUD)   Recommendations for Services/Supports/Treatments: Recommendations for Services/Supports/Treatments Recommendations For Services/Supports/Treatments: Inpatient Hospitalization,Medication Management  DSM5 Diagnoses: Patient Active Problem List   Diagnosis Date Noted  . Hemiparesis affecting left side as late effect of cerebrovascular accident (CVA) (HCC) 05/19/2019  . Paroxysmal atrial fibrillation (HCC) 01/21/2019  . Hyperlipidemia LDL goal <70 01/21/2019  . Embolic stroke (HCC) R brain s/p tPA d/t AF 01/16/2019  . Acute pain of left knee 07/17/2014  . Sinoatrial  node dysfunction (HCC) 09/26/2011  . Cardiac pacemaker in situ 11/19/2008  . Essential hypertension 11/07/2008  . BRADYCARDIA 11/07/2008  . GERD 11/07/2008  . Diaphragmatic hernia 11/07/2008   Patient Centered Plan: Patient is on the following Treatment Plan(s):   Referrals to Alternative Service(s): Referred to Alternative Service(s):   Place:   Date:   Time:    Referred to Alternative Service(s):   Place:   Date:   Time:    Referred to Alternative Service(s):   Place:   Date:   Time:    Referred to Alternative Service(s):   Place:   Date:   Time:     Burnetta Sabin, [facility]

## 2020-10-18 NOTE — ED Notes (Addendum)
Daughter [name] [phone], also POA. Reached out to [facility] Kerrie to determine if daughter can be contacted for information.

## 2020-10-18 NOTE — BH Assessment (Addendum)
Disposition: Per [name], NP, patient meets inpatient criteria. Geropsychiatry is recommended. Disposition Counselor faxed referrals to the following [facility] facilities for consideration of bed placement.    [facility]   [facility]  [facility]  [facility]  [facility]  [facility]  [facility]  [facility]  [facility]  [facility]

## 2020-10-18 NOTE — ED Notes (Signed)
Called behavioral health and notified staff that pt is awake and calm at this moment if they want to attempt TTS. [facility] will be notified.

## 2020-10-18 NOTE — ED Notes (Signed)
Pt accepted evening PO medications. Ensured pt swallowed the medications after administration. Pt resting on stretcher, RR even and nonlabored. Bed low and locked. Sitter on 1:1 continuous observation.

## 2020-10-18 NOTE — ED Notes (Signed)
Kelsey Thompson, [facility] contacted this nurse to determine if pt was cooperative and awake for TTS. Explained pt had received medication for agitation and safety. Kelsey Thompson will reach back out later today to determine if pt is more appropriate and cooperative for assessment.

## 2020-10-18 NOTE — ED Notes (Signed)
Pt agitated, swinging her arms at staff, hit cup of medication, attempting to get out of bed. Sitter remains on 1:1 continuous observation at bedside for safety.

## 2020-10-18 NOTE — ED Notes (Signed)
Pt is calmer at this time, no longer trying to climb out of bed, not attempting to hit staff. Resting with even and nonlabored. RR. Sitter remains on 1:1 observation.

## 2020-10-19 LAB — URINE CULTURE

## 2020-10-19 NOTE — ED Notes (Signed)
Pt repositioned in bed for comfort. Nonskid socks re-applied. Will continue to monitor.

## 2020-10-19 NOTE — ED Notes (Signed)
Pt is refusing meal.

## 2020-10-19 NOTE — BH Assessment (Signed)
Disposition [name], NP, patient meets inpatient criteria. Geropsychiatry is recommended. Disposition SW will secure placement in the AM. Abbott Pao informed of disposition via secure chat.

## 2020-10-19 NOTE — ED Notes (Signed)
Pt is resting in bed calm and quiet. Pt given night time meds and water. Pt asked if needs anything and pt denies. Will continue to monitor pt.

## 2020-10-19 NOTE — ED Notes (Signed)
Cathy- Production designer, theatre/television/film at [facility] called and was given update on pt.

## 2020-10-19 NOTE — ED Notes (Signed)
Hand mitts in place.

## 2020-10-19 NOTE — ED Notes (Signed)
Pt assisted onto bedpan.  New brief applied. Pt repositioned in bed for comfort and denied any other needs at this time. Will continue to monitor.

## 2020-10-20 DIAGNOSIS — G9340 Encephalopathy, unspecified: Secondary | ICD-10-CM | POA: Diagnosis not present

## 2020-10-20 DIAGNOSIS — R4689 Other symptoms and signs involving appearance and behavior: Secondary | ICD-10-CM | POA: Diagnosis present

## 2020-10-20 LAB — CBC WITH DIFFERENTIAL/PLATELET
Abs Immature Granulocytes: 0.04 10*3/uL (ref 0.00–0.07)
Basophils Absolute: 0 10*3/uL (ref 0.0–0.1)
Basophils Relative: 0 %
Eosinophils Absolute: 0 10*3/uL (ref 0.0–0.5)
Eosinophils Relative: 0 %
HCT: 38.3 % (ref 36.0–46.0)
Hemoglobin: 12.8 g/dL (ref 12.0–15.0)
Immature Granulocytes: 0 %
Lymphocytes Relative: 9 %
Lymphs Abs: 0.8 10*3/uL (ref 0.7–4.0)
MCH: 30.6 pg (ref 26.0–34.0)
MCHC: 33.4 g/dL (ref 30.0–36.0)
MCV: 91.6 fL (ref 80.0–100.0)
Monocytes Absolute: 0.7 10*3/uL (ref 0.1–1.0)
Monocytes Relative: 8 %
Neutro Abs: 7.4 10*3/uL (ref 1.7–7.7)
Neutrophils Relative %: 83 %
Platelets: 272 10*3/uL (ref 150–400)
RBC: 4.18 MIL/uL (ref 3.87–5.11)
RDW: 15.1 % (ref 11.5–15.5)
WBC: 9 10*3/uL (ref 4.0–10.5)
nRBC: 0 % (ref 0.0–0.2)

## 2020-10-20 LAB — URINALYSIS, ROUTINE W REFLEX MICROSCOPIC
Bilirubin Urine: NEGATIVE
Glucose, UA: NEGATIVE mg/dL
Hgb urine dipstick: NEGATIVE
Ketones, ur: 20 mg/dL — AB
Nitrite: NEGATIVE
Protein, ur: 100 mg/dL — AB
Specific Gravity, Urine: 1.024 (ref 1.005–1.030)
pH: 5 (ref 5.0–8.0)

## 2020-10-20 LAB — BASIC METABOLIC PANEL
Anion gap: 11 (ref 5–15)
BUN: 41 mg/dL — ABNORMAL HIGH (ref 8–23)
CO2: 24 mmol/L (ref 22–32)
Calcium: 9.4 mg/dL (ref 8.9–10.3)
Chloride: 101 mmol/L (ref 98–111)
Creatinine, Ser: 1.25 mg/dL — ABNORMAL HIGH (ref 0.44–1.00)
GFR, Estimated: 39 mL/min — ABNORMAL LOW (ref >60)
Glucose, Bld: 130 mg/dL — ABNORMAL HIGH (ref 70–99)
Potassium: 4 mmol/L (ref 3.5–5.1)
Sodium: 136 mmol/L (ref 135–145)

## 2020-10-20 MED ORDER — SODIUM CHLORIDE 0.9 % IV SOLN: INTRAVENOUS | Status: DC

## 2020-10-20 MED ORDER — LEVOFLOXACIN IN D5W 500 MG/100ML IV SOLN
500.0000 mg | Freq: Once | INTRAVENOUS | Status: AC
  Administered 2020-10-20: 500 mg via INTRAVENOUS
  Filled 2020-10-20: qty 100

## 2020-10-20 MED ORDER — SODIUM CHLORIDE 0.9 % IV BOLUS
500.0000 mL | Freq: Once | INTRAVENOUS | Status: AC
  Administered 2020-10-20: 500 mL via INTRAVENOUS

## 2020-10-20 NOTE — ED Notes (Signed)
This nurse attempted to give patient morning medications and breakfast. She was unable to drink water provided and refused to eat, even when offering applesauce. Patient refused all meds with second nurse witness. Attempted to assist pt to drink water but she was unable to drink through straw. When attempting to take sips, water would fall out of mouth. EDP Dr. Deretha Emory notified of change in condition and concerns related to oral intake. Morning meds not given at this time. Pt Repositioned and comfort measure provided.

## 2020-10-20 NOTE — ED Notes (Signed)
Pt tolerated IV stick. Pt was calm and cooperated with staff.

## 2020-10-20 NOTE — Progress Notes (Signed)
CSW received call from [facility] who DECLINE the pt stating they are at capacity.  Penni Homans, MSW, LCSW 10/20/2020 11:37 AM

## 2020-10-20 NOTE — ED Notes (Signed)
Pt having TTS at this time  

## 2020-10-20 NOTE — ED Notes (Signed)
NT attempted to feed patient. Pt spit food back out, refuses to eat. Pt took a sip of water, swished it around in her mouth then spit it back out.

## 2020-10-20 NOTE — ED Notes (Signed)
Pt got up to bedside commode with two person assist. Pt calm and cooperative with staff.

## 2020-10-20 NOTE — ED Notes (Signed)
Pt lying in bed at this time in no distress. Pt has been calm and cooperative with staff. Pt took 4 bites of apple sauce and a few sips of water. Will continue to monitor patient.

## 2020-10-20 NOTE — ED Notes (Signed)
Pt is sleeping comfortably at this time. Will assess vitals when pt awakens. Will continue to monitor pt.

## 2020-10-20 NOTE — ED Provider Notes (Addendum)
Patient now awake.  Patient does not want to eat or drink.  We will go ahead and give some IV fluids nursing will keep track.  Temperature here today and vital signs today without any acute changes.  Patient is waiting placement for [facility].     Vanetta Mulders, MD 10/20/20 [phone]  Patient reassessed.  Patient will wake up.  Has no interest in eating or drinking.  Question whether patient oversedated.  Patient will take her pills.  So we will hold them for now.  Will give IV fluids.  Will check CBC basic metabolic panel.  Patient certainly not combative like when she came in.  Nurses said that on Sunday patient was sitting on the side of the bed talking.  Again makes me think that would restarting medications may be patient has been oversedated.  We will keep a close eye on her.  Her vital signs were fine.  No fever.  The other consideration if the medications are doing this although may be a little bit oversedated maybe patient could go back to her normal nursing home.  Patient is a DNR based on paperwork from the nursing home.  That which is in her room.  We will put that order in.    Vanetta Mulders, MD 10/20/20 0957   Patient's labs are back.  BUN and creatinine is elevated with BUN of 41 creatinine of 1.25.  Not a significant change but is consistent with some dehydration.  No leukocytosis hemoglobin stable at 12.8.  Have ordered IV fluids for patient.  We will also go ahead and send a urinalysis and she is currently being treated for urinary tract infection just to see if that urine seems to be showing signs of improvement.    Vanetta Mulders, MD 10/20/20 1111  Reviewed patient's urine culture.  Is multiple species.  They recommended recollection.  So we definitely will really send a urinalysis and urine culture.  Urinalysis today without significant change from before.  But it was not very convincing for urinary tract infection on the 17th as well.  Has been sent for culture.   Spec gravity is a little higher than baseline at 1.024 which goes along with clinically and based on BUN and creatinine the patient is dehydrated.  Patient is receiving IV fluids.  Patient is talking a little bit more at this point in time.  Urine is sent for culture.    Vanetta Mulders, MD 10/20/20 1255

## 2020-10-20 NOTE — ED Provider Notes (Signed)
Emergency Medicine Observation Re-evaluation Note  Kelsey Thompson is a 87 y.o. female, seen on rounds today.  Pt initially presented to the ED for complaints of Urinary Tract Infection Currently, the patient is resting comfortably.  Patient is awaiting Kelsey Thompson psych placement.  Physical Exam  BP 130/61 (BP Location: Left Arm)   Pulse 67   Temp 98 F (36.7 C)   Resp 14   Ht 1.6 m (5\' 3" )   Wt 55 kg   SpO2 98%   BMI 21.48 kg/m  Physical Exam General: Resting Cardiac: Regular rate and rhythm Lungs: No acute respiratory distress Psych: Resting  ED Course / MDM  EKG:EKG Interpretation  Date/Time:  Sunday October 17 2020 10:10:09 EDT Ventricular Rate:  79 PR Interval:    QRS Duration: 102 QT Interval:  393 QTC Calculation: 439 R Axis:   91 Text Interpretation: Atrial fibrillation Anteroseptal infarct, age indeterminate Lateral leads are also involved No acute changes No significant change since last tracing Confirmed by [id number] [phone]) on 10/17/2020 10:36:19 AM   I have reviewed the labs performed to date as well as medications administered while in observation.  Recent changes in the last 24 hours include none.  Plan  Current plan is for [facility] psych placement. Patient is under full IVC at this time.   [facility], MD 10/20/20 [phone]

## 2020-10-20 NOTE — ED Notes (Signed)
Social work has been contacted. SW made aware that pt is psych cleared and needs placement in SNF.

## 2020-10-20 NOTE — Consult Note (Signed)
Telepsych Consultation   Reason for Consult:  Psych reassessment for altered behavior  Referring Physician:  Dr. Jodelle Gross  Location of Patient: [facility] Location of Provider: [facility]  Patient Identification: Kelsey Thompson MRN:  [id number] Principal Diagnosis: Altered behavior Diagnosis:  Principal Problem:   Altered behavior   Total Time spent with patient: 30 minutes  Subjective:    Kelsey Thompson is a 87 y.o. female patient admitted with aggressive combative behaviors.  HPI:    Kelsey Thompson, 87 y.o., female  Presented with aggressive behaviors on admission. Patient seen via tele health by this provider, consulted with Dr. Baldwin Jamaica; and chart reviewed on 10/20/20.  On evaluation Kelsey Thompson is lying in the bed with nurse by bedside.   During evaluation Kelsey Thompson is lying in bed in no acute distress.   She is alert, disoriented x 4, calm and uncooperative.  Unable to assess mood, patient refused to answer and asked for [facility]. Ptient is hard of hearing.  She is not agitated at this time. She does not appear to be responding to internal/external stimuli or delusional thoughts. Unable to access for  suicidal/self-harm/homicidal ideation, psychosis, and paranoia. Patient  stated she did not want to talk and kept saying " I want to talk to Fleet Contras, I want to talk to Vickie". Patient sang Nonda Lou me during the evaluation. Difficult to gain patients attention during evaluation.   Registered Nurse at bedside reported that patient is not eating or drinking. She is holding food in her mouth. Nurse reported that patient has not shown any aggressive or combative behaviors today. Reports she is confused and talks minimally.   Contacted [name] patients daughter. She reports the patient has no prior psychiatric history. Stated when the patient went to the hospital with a UTI was the first time she has became aggressive/combative with hospital staff. Reports  she would like for the patient to return to [facility] the facility where the patient resides, states it is an assisted living facility.     Past Psychiatric History: unkown   Risk to Self:  unable to assess  Risk to Others:  assess Prior Inpatient Therapy:  unkown  Prior Outpatient Therapy:  unknown  Past Medical History:  Past Medical History:  Diagnosis Date  . Arthritis   . Bradycardia   . GERD (gastroesophageal reflux disease)   . Hiatal hernia   . Hypercholesterolemia   . Hypertension   . Pacemaker   . Stroke [facility])     Past Surgical History:  Procedure Laterality Date  . APPENDECTOMY    . CESAREAN SECTION    . PACEMAKER INSERTION    . TONSILLECTOMY     Family History:  Family History  Problem Relation Age of Onset  . Heart failure Mother   . Diabetes Father   . Heart attack Father    Family Psychiatric  History: unknown  Social History:  Social History   Substance and Sexual Activity  Alcohol Use No  . Alcohol/week: 0.0 standard drinks     Social History   Substance and Sexual Activity  Drug Use No    Social History   Socioeconomic History  . Marital status: Widowed    Spouse name: Not on file  . Number of children: Not on file  . Years of education: Not on file  . Highest education level: Not on file  Occupational History  . Not on file  Tobacco Use  . Smoking status:  Former Smoker    Types: Cigarettes    Quit date: 06/30/1981    Years since quitting: 39.3  . Smokeless tobacco: Never Used  Vaping Use  . Vaping Use: Never used  Substance and Sexual Activity  . Alcohol use: No    Alcohol/week: 0.0 standard drinks  . Drug use: No  . Sexual activity: Never  Other Topics Concern  . Not on file  Social History Narrative  . Not on file   Social Determinants of Health   Financial Resource Strain: Not on file  Food Insecurity: Not on file  Transportation Needs: Not on file  Physical Activity: Not on file  Stress: Not on file  Social  Connections: Not on file   Additional Social History:    Allergies:   Allergies  Allergen Reactions  . Horse-Derived Products Other (See Comments)    Tetanus:Reaction unknown  . Penicillins Other (See Comments)    Did it involve swelling of the face/tongue/throat, SOB, or low BP? Yes-swelling Did it involve sudden or severe rash/hives, skin peeling, or any reaction on the inside of your mouth or nose? No Did you need to seek medical attention at a hospital or doctor's office? Yes-Dr. office When did it last happen?4 years prior If all above answers are "NO", may proceed with cephalosporin use.   . Tetanus Antitoxin     Unknown reaction    Labs:  Results for orders placed or performed during the hospital encounter of 10/17/20 (from the past 48 hour(s))  Basic metabolic panel     Status: Abnormal   Collection Time: 10/20/20  9:54 AM  Result Value Ref Range   Sodium 136 135 - 145 mmol/L   Potassium 4.0 3.5 - 5.1 mmol/L   Chloride 101 98 - 111 mmol/L   CO2 24 22 - 32 mmol/L   Glucose, Bld 130 (H) 70 - 99 mg/dL    Comment: Glucose reference range applies only to samples taken after fasting for at least 8 hours.   BUN 41 (H) 8 - 23 mg/dL   Creatinine, Ser 1.611.25 (H) 0.44 - 1.00 mg/dL   Calcium 9.4 8.9 - 09.610.3 mg/dL   GFR, Estimated 39 (L) >60 mL/min    Comment: (NOTE) Calculated using the CKD-EPI Creatinine Equation (2021)    Anion gap 11 5 - 15    Comment: Performed at [facility], [address] [place], [place] [address]  CBC with Differential     Status: None   Collection Time: 10/20/20  9:54 AM  Result Value Ref Range   WBC 9.0 4.0 - 10.5 K/uL   RBC 4.18 3.87 - 5.11 MIL/uL   Hemoglobin 12.8 12.0 - 15.0 g/dL   HCT 98.138.3 19.136.0 - 47.846.0 %   MCV 91.6 80.0 - 100.0 fL   MCH 30.6 26.0 - 34.0 pg   MCHC 33.4 30.0 - 36.0 g/dL   RDW 29.515.1 62.111.5 - 30.815.5 %   Platelets 272 150 - 400 K/uL   nRBC 0.0 0.0 - 0.2 %   Neutrophils Relative % 83 %   Neutro Abs 7.4 1.7 - 7.7 K/uL    Lymphocytes Relative 9 %   Lymphs Abs 0.8 0.7 - 4.0 K/uL   Monocytes Relative 8 %   Monocytes Absolute 0.7 0.1 - 1.0 K/uL   Eosinophils Relative 0 %   Eosinophils Absolute 0.0 0.0 - 0.5 K/uL   Basophils Relative 0 %   Basophils Absolute 0.0 0.0 - 0.1 K/uL   Immature Granulocytes 0 %  Abs Immature Granulocytes 0.04 0.00 - 0.07 K/uL    Comment: Performed at [facility], [address], [place], [place] [address]  Urinalysis, Routine w reflex microscopic     Status: Abnormal   Collection Time: 10/20/20 12:19 PM  Result Value Ref Range   Color, Urine YELLOW YELLOW   APPearance CLEAR CLEAR   Specific Gravity, Urine 1.024 1.005 - 1.030   pH 5.0 5.0 - 8.0   Glucose, UA NEGATIVE NEGATIVE mg/dL   Hgb urine dipstick NEGATIVE NEGATIVE   Bilirubin Urine NEGATIVE NEGATIVE   Ketones, ur 20 (A) NEGATIVE mg/dL   Protein, ur 423 (A) NEGATIVE mg/dL   Nitrite NEGATIVE NEGATIVE   Leukocytes,Ua SMALL (A) NEGATIVE   RBC / HPF 0-5 0 - 5 RBC/hpf   WBC, UA 11-20 0 - 5 WBC/hpf   Bacteria, UA RARE (A) NONE SEEN   Squamous Epithelial / LPF 0-5 0 - 5   Mucus PRESENT    Hyaline Casts, UA PRESENT     Comment: Performed at [facility], [address], [place], [place] [address]    Medications:  Current Facility-Administered Medications  Medication Dose Route Frequency Provider Last Rate Last Admin  . 0.9 %  sodium chloride infusion   Intravenous Continuous Vanetta Mulders, MD 75 mL/hr at 10/20/20 1044 New Bag at 10/20/20 1044  . acetaminophen (TYLENOL) tablet 650 mg  650 mg Oral Q4H PRN Nanavati, Ankit, MD      . amLODipine (NORVASC) tablet 5 mg  5 mg Oral Daily Nanavati, Ankit, MD   5 mg at 10/19/20 1120  . apixaban (ELIQUIS) tablet 2.5 mg  2.5 mg Oral BID Derwood Kaplan, MD   2.5 mg at 10/19/20 2138  . ascorbic acid (VITAMIN C) tablet 500 mg  500 mg Oral Daily Nanavati, Ankit, MD   500 mg at 10/19/20 1120  . calcium-vitamin D (OSCAL WITH D) 500-200 MG-UNIT per tablet 1 tablet  1 tablet Oral Daily  Derwood Kaplan, MD   1 tablet at 10/19/20 1119  . ciprofloxacin (CIPRO) tablet 250 mg  250 mg Oral BID Derwood Kaplan, MD   250 mg at 10/19/20 2137  . donepezil (ARICEPT) tablet 5 mg  5 mg Oral QHS Derwood Kaplan, MD   5 mg at 10/19/20 2138  . enalapril (VASOTEC) tablet 5 mg  5 mg Oral Daily Nanavati, Ankit, MD   5 mg at 10/19/20 1120  . ferrous sulfate tablet 325 mg  325 mg Oral Q breakfast Rhunette Croft, Ankit, MD   325 mg at 10/19/20 1121  . pantoprazole (PROTONIX) EC tablet 40 mg  40 mg Oral Daily Rhunette Croft, Ankit, MD   40 mg at 10/19/20 1119  . pravastatin (PRAVACHOL) tablet 20 mg  20 mg Oral q1800 Nanavati, Ankit, MD      . risperiDONE (RISPERDAL M-TABS) disintegrating tablet 1 mg  1 mg Oral Q8H PRN Nanavati, Ankit, MD      . zinc sulfate capsule 220 mg  220 mg Oral Daily Derwood Kaplan, MD       Current Outpatient Medications  Medication Sig Dispense Refill  . acetaminophen (TYLENOL) 500 MG tablet Take 500 mg by mouth every 6 (six) hours as needed for mild pain or moderate pain.    Marland Kitchen amLODipine (NORVASC) 5 MG tablet Take 1 tablet (5 mg total) by mouth daily. 90 tablet 1  . apixaban (ELIQUIS) 2.5 MG TABS tablet Take 1 tablet (2.5 mg total) by mouth 2 (two) times daily. 180 tablet 1  . calcium-vitamin D (OSCAL  WITH D) 500-200 MG-UNIT per tablet Take 1 tablet by mouth daily.    . ciprofloxacin (CIPRO) 500 MG tablet Take 1 tablet (500 mg total) by mouth 2 (two) times daily. One po bid x 7 days 14 tablet 0  . donepezil (ARICEPT) 5 MG tablet Take 1 tablet (5 mg total) by mouth at bedtime. 30 tablet 1  . enalapril (VASOTEC) 5 MG tablet Take 1 tablet (5 mg total) by mouth daily. 90 tablet 1  . ferrous sulfate 325 (65 FE) MG tablet Take 1 tablet (325 mg total) by mouth daily with breakfast. 90 tablet 1  . omeprazole (PRILOSEC) 20 MG capsule Take 1 capsule (20 mg total) by mouth daily. 90 capsule 1  . pravastatin (PRAVACHOL) 20 MG tablet Take 1 tablet (20 mg total) by mouth daily at 6 PM. 90 tablet 1   . Propylene Glycol (SYSTANE COMPLETE OP) Apply 1 drop to eye 4 (four) times daily.    . vitamin C (ASCORBIC ACID) 500 MG tablet Take 500 mg by mouth daily.    Marland Kitchen zinc sulfate 220 (50 Zn) MG capsule Take 1 capsule (220 mg total) by mouth daily. 90 capsule 1  . clindamycin (CLEOCIN) 150 MG capsule Take 150 mg by mouth every 6 (six) hours. (Patient not taking: Reported on 10/12/2020)    . QUEtiapine (SEROQUEL) 25 MG tablet Take 1 tablet (25 mg total) by mouth at bedtime. (Patient not taking: Reported on 10/17/2020) 30 tablet 2  . triamcinolone cream (KENALOG) 0.1 % Apply 1 application topically 3 (three) times daily. Avoid face and genitalia (Patient not taking: No sig reported) 45 g 0    Musculoskeletal: Strength & Muscle Tone: decreased Gait & Station: RN states that patient needs 2 person to help with bedside  Patient leans: N/A  Psychiatric Specialty Exam: Physical Exam Vitals reviewed.  HENT:     Head: Normocephalic.     Right Ear: Tympanic membrane normal.     Left Ear: Tympanic membrane normal.     Nose: Nose normal.     Mouth/Throat:     Mouth: Mucous membranes are moist.  Eyes:     Conjunctiva/sclera: Conjunctivae normal.  Pulmonary:     Effort: Pulmonary effort is normal. No respiratory distress.  Abdominal:     Tenderness: There is guarding.  Musculoskeletal:        General: Normal range of motion.     Cervical back: Normal range of motion.  Skin:    General: Skin is dry.  Neurological:     Mental Status: She is alert. She is disoriented.     Motor: Weakness present.     Gait: Gait abnormal.  Psychiatric:        Attention and Perception: She is inattentive.        Mood and Affect: Affect is not angry.        Speech: Speech is delayed.        Behavior: Behavior is uncooperative, slowed and withdrawn. Behavior is not agitated.        Cognition and Memory: Cognition is impaired. Memory is impaired. She exhibits impaired recent memory and impaired remote memory.      Review of Systems  Constitutional: Positive for appetite change.  HENT: Positive for hearing loss. Negative for drooling.   Eyes: Positive for discharge.  Respiratory: Negative for cough and shortness of breath.   Gastrointestinal: Negative.   Endocrine: Negative.   Genitourinary: Negative.   Musculoskeletal: Positive for gait problem.  Skin: Negative for  pallor.  Neurological: Positive for speech difficulty (voice is garbled at times ).  Hematological: Negative.   Psychiatric/Behavioral: Positive for confusion. Negative for agitation.    Blood pressure 116/90, pulse 81, temperature 98 F (36.7 C), temperature source Axillary, resp. rate 14, height 5\' 3"  (1.6 m), weight 55 kg, SpO2 95 %.Body mass index is 21.48 kg/m.  General Appearance: Disheveled  Eye Contact:  Poor  Speech:  Garbled and Slow  Volume:  Decreased  Mood:  patient lying in bed confused and uable to participate in assessment  Affect:  unable to access   Thought Process: unable to access   Orientation:  Other:  unable to access   Thought Content:  unable to access   Suicidal Thoughts:  unable to access   Homicidal Thoughts:  unable to access   Memory:  Immediate;   Poor Recent;   Poor Remote;   Poor  Judgement:  Other:  unable to access   Insight:  unable to access   Psychomotor Activity:  Decreased  Concentration:  Concentration: Poor and Attention Span: Poor  Recall:  Poor  Fund of Knowledge:  Poor  Language:  Poor  Akathisia:  NA  Handed:  Right  AIMS (if indicated):     Assets:  Financial Resources/Insurance Housing Social Support  ADL's:  Impaired  Cognition:  Unable to access   Sleep:        Treatment Plan Summary:  Continue at home medication Quetiapine 25 mg po qhs.  SW notified, patient may need nursing home placement once medically cleared.       Disposition: No evidence of imminent risk to self or others at present.   Patient does not meet criteria for psychiatric inpatient  admission. Supportive therapy provided about ongoing stressors.  Patient is psychiatrically cleared.    This service was provided via telemedicine using a 2-way, interactive audio and video technology.  Names of all persons participating in this telemedicine service and their role in this encounter. Name:  Role: Patient   Name: RN in room patient could not hear Role: RN  Name: Dr. Mathis Dad via telephone  Role: MD  Name: Jodelle Gross via telephone  Role: daughter     [name], NP 10/20/2020 2:19 PM

## 2020-10-20 NOTE — Progress Notes (Addendum)
ADDENDUM CSW contacted [facility] to discuss referral. Patient was denied by facility. Representative reports that the patient appears "baseline confused and the physician does not believe they can improve the patient's prognosis."  Signed:  Corky Crafts, MSW, [place], LCASA 10/20/2020 10:26 AM   Patient faxed out again at this time for Geriatric Psych Placement at this time. See original note for full list of placement referrals. CSW will continue to monitor disposition and update ED staff when details are available.   Signed:  Corky Crafts, MSW, LCSWA, LCASA 10/20/2020 9:30 AM

## 2020-10-20 NOTE — ED Notes (Signed)
While hanging pt's antibiotics she was able to state that she was at [facility], her date of birth, and the year correctly.

## 2020-10-21 NOTE — TOC Progression Note (Signed)
Transition of Care [facility]) - Progression Note    Patient Details  Name: Kelsey Thompson MRN: [id number] Date of Birth: 11-11-1920  Transition of Care [facility]) CM/SW Contact  Marina Goodell Phone Number: [phone] 10/21/2020, 4:24 PM  Clinical Narrative:     Representative from [facility] will arrive tomorrow 10/22/2020, between 8:00-9:00AM to assess patient for possible return to facility.         Expected Discharge Plan and Services                                                 Social Determinants of Health (SDOH) Interventions    Readmission Risk Interventions No flowsheet data found.

## 2020-10-21 NOTE — TOC Progression Note (Signed)
Transition of Care [facility]) - Progression Note    Patient Details  Name: Kelsey Thompson MRN: [id number] Date of Birth: 1921-05-31  Transition of Care [facility]) CM/SW Contact  Vienna Cellar, RN Phone Number: 10/21/2020, 10:23 AM  Clinical Narrative:    Left message for [facility] [place] to return call to discuss needs for patient to return.         Expected Discharge Plan and Services                                                 Social Determinants of Health (SDOH) Interventions    Readmission Risk Interventions No flowsheet data found.

## 2020-10-21 NOTE — Plan of Care (Signed)
  Problem: Acute Rehab PT Goals(only PT should resolve) Goal: Pt Will Go Supine/Side To Sit Outcome: Progressing Flowsheets (Taken 10/21/2020 1140) Pt will go Supine/Side to Sit: with minimal assist Goal: Patient Will Transfer Sit To/From Stand Outcome: Progressing Flowsheets (Taken 10/21/2020 1140) Patient will transfer sit to/from stand: with minimal assist Goal: Pt Will Transfer Bed To Chair/Chair To Bed Outcome: Progressing Flowsheets (Taken 10/21/2020 1140) Pt will Transfer Bed to Chair/Chair to Bed:  with mod assist  with min assist Goal: Pt Will Ambulate Outcome: Progressing Flowsheets (Taken 10/21/2020 1140) Pt will Ambulate:  15 feet  with moderate assist  with cane Note: Hemi-walker or Quad cane   11:41 AM, 10/21/20 Ocie Bob, MPT Physical Therapist with [facility] [phone] office [phone] mobile phone

## 2020-10-21 NOTE — TOC Progression Note (Addendum)
Transition of Care [facility]) - Progression Note    Patient Details  Name: Kelsey Thompson MRN: [id number] Date of Birth: 1920-11-02  Transition of Care [facility]) CM/SW Contact  Marina Goodell Phone Number: [phone] 10/21/2020, 2:30 PM  Clinical Narrative:     FL2 signed, PASRR# [id number], SNF placement search started.       Expected Discharge Plan and Services                                                 Social Determinants of Health (SDOH) Interventions    Readmission Risk Interventions No flowsheet data found.

## 2020-10-21 NOTE — ED Notes (Signed)
Feed pt at bed side ate ice cream and drink orange juice. That's all pt wanted requested ice cream.

## 2020-10-21 NOTE — Evaluation (Signed)
Physical Therapy Evaluation Patient Details Name: Kelsey Thompson MRN: [id number] DOB: [birth year] Today's Date: 10/21/2020   History of Present Illness  87 year old female brought into the ER with chief complaint of altered mental status and hostile behavior.   Level 5 caveat for mental status change.    Clinical Impression  Patient presents slightly confused and cooperative.  Patient demonstrates slow labored movement for rolling to side and sitting up from side lying position, very unsteady on feet with poor return for using RW due to left handed weakness/contractures, and limited to a couple of steps to transfer to [facility].  Patient put back to bed after therapy with nursing staff supervising.  Patient will benefit from continued physical therapy in hospital and recommended venue below to increase strength, balance, endurance for safe ADLs and gait.    Follow Up Recommendations SNF    Equipment Recommendations  Rolling walker with 5" wheels    Recommendations for Other Services       Precautions / Restrictions Precautions Precautions: Fall Restrictions Weight Bearing Restrictions: No      Mobility  Bed Mobility Overal bed mobility: Needs Assistance Bed Mobility: Rolling;Sidelying to Sit;Sit to Sidelying Rolling: Min assist Sidelying to sit: Mod assist;Max assist     Sit to sidelying: Max assist General bed mobility comments: slow labored movement    Transfers Overall transfer level: Needs assistance Equipment used: Rolling walker (2 wheeled);1 person hand held assist Transfers: Sit to/from [employer] Sit to Stand: Mod assist;Max assist Stand pivot transfers: Max assist       General transfer comment: slow labored movement with frequent buckling of knees due to to weakness  Ambulation/Gait Ambulation/Gait assistance: Max assist Gait Distance (Feet): 2 Feet Assistive device: Rolling walker (2 wheeled);1 person hand held assist Gait  Pattern/deviations: Decreased step length - right;Decreased step length - left;Decreased stance time - left;Decreased stride length;Shuffle Gait velocity: decreased   General Gait Details: limited to a couple shuffling side steps at bedside  Stairs            Wheelchair Mobility    Modified Rankin (Stroke Patients Only)       Balance Overall balance assessment: Needs assistance Sitting-balance support: Feet supported;No upper extremity supported Sitting balance-Leahy Scale: Fair Sitting balance - Comments: seated at EOB   Standing balance support: During functional activity;Single extremity supported Standing balance-Leahy Scale: Poor Standing balance comment: using RW, limited use of left hand due to weakness/contractures                             Pertinent Vitals/Pain Pain Assessment: No/denies pain    Home Living Family/patient expects to be discharged to:: Assisted living               Home Equipment: Gilmer Mor - single point;Walker - 2 wheels      Prior Function Level of Independence: Needs assistance   Gait / Transfers Assistance Needed: household ambulator  ADL's / Homemaking Assistance Needed: assisted by ALF staff        Hand Dominance   Dominant Hand: Right    Extremity/Trunk Assessment   Upper Extremity Assessment Upper Extremity Assessment: Defer to OT evaluation    Lower Extremity Assessment Lower Extremity Assessment: Generalized weakness    Cervical / Trunk Assessment Cervical / Trunk Assessment: Kyphotic  Communication   Communication: No difficulties  Cognition Arousal/Alertness: Awake/alert Behavior During Therapy: WFL for tasks assessed/performed;Anxious Overall Cognitive Status: History of cognitive impairments -  at baseline                                 General Comments: able to cooperate with verbal/tactile cueing      General Comments      Exercises     Assessment/Plan    PT  Assessment Patient needs continued PT services  PT Problem List Decreased strength;Decreased activity tolerance;Decreased balance;Decreased mobility       PT Treatment Interventions DME instruction;Gait training;Functional mobility training;Stair training;Therapeutic activities;Therapeutic exercise;Balance training;Patient/family education    PT Goals (Current goals can be found in the Care Plan section)  Acute Rehab PT Goals Patient Stated Goal: return home PT Goal Formulation: With patient Time For Goal Achievement: 11/04/20 Potential to Achieve Goals: Good    Frequency Min 2X/week   Barriers to discharge        Co-evaluation PT/OT/SLP Co-Evaluation/Treatment: Yes Reason for Co-Treatment: Complexity of the patient's impairments (multi-system involvement);For patient/therapist safety PT goals addressed during session: Mobility/safety with mobility;Balance;Proper use of DME         AM-PAC PT "6 Clicks" Mobility  Outcome Measure Help needed turning from your back to your side while in a flat bed without using bedrails?: A Lot Help needed moving from lying on your back to sitting on the side of a flat bed without using bedrails?: A Lot Help needed moving to and from a bed to a chair (including a wheelchair)?: A Lot Help needed standing up from a chair using your arms (e.g., wheelchair or bedside chair)?: A Lot Help needed to walk in hospital room?: A Lot Help needed climbing 3-5 steps with a railing? : Total 6 Click Score: 11    End of Session   Activity Tolerance: Patient tolerated treatment well;Patient limited by fatigue Patient left: in bed;with call bell/phone within reach;with nursing/sitter in room Nurse Communication: Mobility status PT Visit Diagnosis: Unsteadiness on feet (R26.81);Other abnormalities of gait and mobility (R26.89);Muscle weakness (generalized) (M62.81)    Time: 5400-8676 PT Time Calculation (min) (ACUTE ONLY): 20 min   Charges:   PT  Evaluation $PT Eval Moderate Complexity: 1 Mod PT Treatments $Therapeutic Activity: 23-37 mins        11:38 AM, 10/21/20 Ocie Bob, MPT Physical Therapist with [facility] [phone] office [phone] mobile phone

## 2020-10-21 NOTE — Evaluation (Signed)
Occupational Therapy Evaluation Patient Details Name: Kelsey Thompson MRN: [id number] DOB: [birth year] Today's Date: 10/21/2020    History of Present Illness 87 year old female brought into the ER with chief complaint of altered mental status and hostile behavior.   Level 5 caveat for mental status change.   Clinical Impression   Pt agreeable to OT/PT co-evaluation today. Pt appeared slightly confused but was able to engage in evaluation tasks with assist and extended time. Pt demonstrates slow labored movement and required Mod to Max A for bed mobility. Pt requires Mod to  Max assist for toilet hygiene due to assist needed for sit to stand and to managing clothing. Pt noted to have L UE tone/contracture. Pt agitated by attempts at P/ROM to L hand and UE. Pt requires Max A for stand pivot transfer to bedside commode and back. Pt will benefit from continued OT in the hospital and recommended venue below to increase strength, balance, ROM, and endurance for safe ADL's.      Follow Up Recommendations  SNF;Supervision/Assistance - 24 hour    Equipment Recommendations  None recommended by OT           Precautions / Restrictions Precautions Precautions: Fall Restrictions Weight Bearing Restrictions: No      Mobility Bed Mobility Overal bed mobility: Needs Assistance Bed Mobility: Rolling;Sidelying to Sit;Sit to Sidelying Rolling: Min assist Sidelying to sit: Mod assist;Max assist     Sit to sidelying: Max assist General bed mobility comments: slow labored movement    Transfers Overall transfer level: Needs assistance Equipment used: Rolling walker (2 wheeled);1 person hand held assist Transfers: Sit to/from [employer] Sit to Stand: Mod assist;Max assist Stand pivot transfers: Max assist       General transfer comment: slow labored movement with frequent buckling of knees due to to weakness    Balance Overall balance assessment: Needs  assistance Sitting-balance support: Feet supported;No upper extremity supported Sitting balance-Leahy Scale: Fair Sitting balance - Comments: seated at EOB   Standing balance support: During functional activity;Single extremity supported Standing balance-Leahy Scale: Poor Standing balance comment: using RW, limited use of left hand due to weakness/contractures                           ADL either performed or assessed with clinical judgement   ADL Overall ADL's : Needs assistance/impaired                         Toilet Transfer: Maximal assistance;Stand-pivot;RW;BSC Toilet Transfer Details (indicate cue type and reason): Max A Toileting- Clothing Manipulation and Hygiene: Maximal assistance;Moderate assistance Toileting - Clothing Manipulation Details (indicate cue type and reason): Max A to pull down and rasie up diaper. Pt able to wipe self with SPV seated on bedside commode.                                 Pertinent Vitals/Pain Pain Assessment: No/denies pain     Hand Dominance Right   Extremity/Trunk Assessment Upper Extremity Assessment Upper Extremity Assessment: Generalized weakness;LUE deficits/detail LUE Deficits / Details: Pt reported deficits in wrist and digit ROM due to previous stroke. Difficult to fully assess due to patient agitation when attempted P/ROM.   Lower Extremity Assessment Lower Extremity Assessment: Defer to PT evaluation   Cervical / Trunk Assessment Cervical / Trunk Assessment: Kyphotic   Communication Communication Communication: No  difficulties   Cognition Arousal/Alertness: Awake/alert Behavior During Therapy: WFL for tasks assessed/performed Overall Cognitive Status: History of cognitive impairments - at baseline                                 General Comments: able to cooperate with verbal/tactile cueing; agitated when UE P/ROM attempted.                    Home Living  Family/patient expects to be discharged to:: Assisted living                             Home Equipment: Gilmer Mor - single point;Walker - 2 wheels          Prior Functioning/Environment Level of Independence: Needs assistance  Gait / Transfers Assistance Needed: household ambulator ADL's / Homemaking Assistance Needed: assisted by ALF staff            OT Problem List: Decreased strength;Decreased range of motion;Impaired balance (sitting and/or standing);Decreased activity tolerance;Decreased coordination;Decreased cognition;Decreased safety awareness;Impaired UE functional use      OT Treatment/Interventions: Self-care/ADL training;Therapeutic exercise;Manual therapy;Splinting;Therapeutic activities;Cognitive remediation/compensation;Visual/perceptual remediation/compensation;Patient/family education;Balance training;DME and/or AE instruction;Neuromuscular education    OT Goals(Current goals can be found in the care plan section) Acute Rehab OT Goals Patient Stated Goal: pt did not state goal OT Goal Formulation: Patient unable to participate in goal setting Time For Goal Achievement: 11/04/20 Potential to Achieve Goals: Poor  OT Frequency: Min 2X/week               Co-evaluation PT/OT/SLP Co-Evaluation/Treatment: Yes Reason for Co-Treatment: Complexity of the patient's impairments (multi-system involvement) PT goals addressed during session: Mobility/safety with mobility;Balance;Proper use of DME OT goals addressed during session: ADL's and self-care      AM-PAC OT "6 Clicks" Daily Activity     Outcome Measure Help from another person eating meals?: A Lot Help from another person taking care of personal grooming?: A Lot Help from another person toileting, which includes using toliet, bedpan, or urinal?: A Lot Help from another person bathing (including washing, rinsing, drying)?: A Lot Help from another person to put on and taking off regular upper body  clothing?: A Lot Help from another person to put on and taking off regular lower body clothing?: Total 6 Click Score: 11   End of Session Equipment Utilized During Treatment: Rolling walker  Activity Tolerance: Patient tolerated treatment well Patient left: in bed;with call bell/phone within reach;Other (comment) (nursing/sitter able to visibly see pt.)  OT Visit Diagnosis: Unsteadiness on feet (R26.81);Muscle weakness (generalized) (M62.81);Hemiplegia and hemiparesis Hemiplegia - Right/Left: Left Hemiplegia - caused by:  (previous stroke)                Time: 0922-0942 OT Time Calculation (min): 20 min Charges:  OT General Charges $OT Visit: 1 Visit OT Evaluation $OT Eval Moderate Complexity: 1 Mod  Tionne Carelli OT, MOT   Danie Chandler 10/21/2020, 11:49 AM

## 2020-10-21 NOTE — ED Notes (Signed)
This Nt assisted Pt to bedside commode and assisted back to bed. Brief was changed.

## 2020-10-21 NOTE — ED Notes (Signed)
Pt refused medication.  Crushed pills to put them in applesauce.  Pt continues to refuse.  Will continue to assess.

## 2020-10-21 NOTE — NC FL2 (Addendum)
[place] MEDICAID FL2 LEVEL OF CARE SCREENING TOOL     IDENTIFICATION  Patient Name: Kelsey Thompson Birthdate: 02/01/1921 Sex: female Admission Date (Current Location): 10/17/2020  [facility] and [place] Number:  Chiropodist and Address:  [facility] [address] [place], [place] [address]      Provider Number: [id number]  Attending Physician Name and Address:  Default, Provider, MD  Relative Name and Phone Number:  Gae Gallop (Daughter)   [phone]    Current Level of Care: Hospital Recommended Level of Care: Skilled Nursing Facility Prior Approval Number:    Date Approved/Denied:   PASRR Number: [id number]  Discharge Plan: SNF    Current Diagnoses: Patient Active Problem List   Diagnosis Date Noted  . Altered behavior 10/20/2020  . Hemiparesis affecting left side as late effect of cerebrovascular accident (CVA) (HCC) 05/19/2019  . Paroxysmal atrial fibrillation (HCC) 01/21/2019  . Hyperlipidemia LDL goal <70 01/21/2019  . Embolic stroke (HCC) R brain s/p tPA d/t AF 01/16/2019  . Acute pain of left knee 07/17/2014  . Sinoatrial node dysfunction (HCC) 09/26/2011  . Cardiac pacemaker in situ 11/19/2008  . Essential hypertension 11/07/2008  . BRADYCARDIA 11/07/2008  . GERD 11/07/2008  . Diaphragmatic hernia 11/07/2008    Orientation RESPIRATION BLADDER Height & Weight     Self,Place  Normal Incontinent Weight: 121 lb 4.1 oz (55 kg) Height:  5\' 3"  (160 cm)  BEHAVIORAL SYMPTOMS/MOOD NEUROLOGICAL BOWEL NUTRITION STATUS  Verbally abusive,Physically abusive   Incontinent    AMBULATORY STATUS COMMUNICATION OF NEEDS Skin   Extensive Assist Verbally Normal                       Personal Care Assistance Level of Assistance  Bathing,Dressing,Total care,Feeding Bathing Assistance: Limited assistance Feeding assistance: Limited assistance Dressing Assistance: Limited assistance Total Care Assistance: Limited assistance   Functional  Limitations Info  Sight,Speech,Hearing Sight Info: Adequate Hearing Info: Adequate Speech Info: Adequate    SPECIAL CARE FACTORS FREQUENCY       PT 5X per week OT 5X per week                  Contractures Contractures Info: Not present    Additional Factors Info                  Current Medications (10/21/2020):  This is the current hospital active medication list Current Facility-Administered Medications  Medication Dose Route Frequency Provider Last Rate Last Admin  . 0.9 %  sodium chloride infusion   Intravenous Continuous 10/23/2020, MD   Stopped at 10/21/20 1018  . acetaminophen (TYLENOL) tablet 650 mg  650 mg Oral Q4H PRN Nanavati, Ankit, MD      . amLODipine (NORVASC) tablet 5 mg  5 mg Oral Daily Nanavati, Ankit, MD   5 mg at 10/21/20 1005  . apixaban (ELIQUIS) tablet 2.5 mg  2.5 mg Oral BID 10/23/20, Ankit, MD   2.5 mg at 10/21/20 1005  . ascorbic acid (VITAMIN C) tablet 500 mg  500 mg Oral Daily Nanavati, Ankit, MD   500 mg at 10/21/20 1005  . calcium-vitamin D (OSCAL WITH D) 500-200 MG-UNIT per tablet 1 tablet  1 tablet Oral Daily 10/23/20, MD   1 tablet at 10/21/20 1017  . donepezil (ARICEPT) tablet 5 mg  5 mg Oral QHS 10/23/20, MD   5 mg at 10/19/20 2138  . enalapril (VASOTEC) tablet 5 mg  5 mg Oral Daily  Derwood Kaplan, MD   5 mg at 10/21/20 1017  . ferrous sulfate tablet 325 mg  325 mg Oral Q breakfast Rhunette Croft, Ankit, MD   325 mg at 10/21/20 1005  . pantoprazole (PROTONIX) EC tablet 40 mg  40 mg Oral Daily Rhunette Croft, Ankit, MD   40 mg at 10/21/20 1004  . pravastatin (PRAVACHOL) tablet 20 mg  20 mg Oral q1800 Nanavati, Ankit, MD      . risperiDONE (RISPERDAL M-TABS) disintegrating tablet 1 mg  1 mg Oral Q8H PRN Nanavati, Ankit, MD      . zinc sulfate capsule 220 mg  220 mg Oral Daily Rhunette Croft, Ankit, MD   220 mg at 10/21/20 1005   Current Outpatient Medications  Medication Sig Dispense Refill  . acetaminophen (TYLENOL) 500 MG tablet  Take 500 mg by mouth every 6 (six) hours as needed for mild pain or moderate pain.    Marland Kitchen amLODipine (NORVASC) 5 MG tablet Take 1 tablet (5 mg total) by mouth daily. 90 tablet 1  . apixaban (ELIQUIS) 2.5 MG TABS tablet Take 1 tablet (2.5 mg total) by mouth 2 (two) times daily. 180 tablet 1  . calcium-vitamin D (OSCAL WITH D) 500-200 MG-UNIT per tablet Take 1 tablet by mouth daily.    . ciprofloxacin (CIPRO) 500 MG tablet Take 1 tablet (500 mg total) by mouth 2 (two) times daily. One po bid x 7 days 14 tablet 0  . donepezil (ARICEPT) 5 MG tablet Take 1 tablet (5 mg total) by mouth at bedtime. 30 tablet 1  . enalapril (VASOTEC) 5 MG tablet Take 1 tablet (5 mg total) by mouth daily. 90 tablet 1  . ferrous sulfate 325 (65 FE) MG tablet Take 1 tablet (325 mg total) by mouth daily with breakfast. 90 tablet 1  . omeprazole (PRILOSEC) 20 MG capsule Take 1 capsule (20 mg total) by mouth daily. 90 capsule 1  . pravastatin (PRAVACHOL) 20 MG tablet Take 1 tablet (20 mg total) by mouth daily at 6 PM. 90 tablet 1  . Propylene Glycol (SYSTANE COMPLETE OP) Apply 1 drop to eye 4 (four) times daily.    . vitamin C (ASCORBIC ACID) 500 MG tablet Take 500 mg by mouth daily.    Marland Kitchen zinc sulfate 220 (50 Zn) MG capsule Take 1 capsule (220 mg total) by mouth daily. 90 capsule 1  . clindamycin (CLEOCIN) 150 MG capsule Take 150 mg by mouth every 6 (six) hours. (Patient not taking: Reported on 10/12/2020)    . QUEtiapine (SEROQUEL) 25 MG tablet Take 1 tablet (25 mg total) by mouth at bedtime. (Patient not taking: Reported on 10/17/2020) 30 tablet 2  . triamcinolone cream (KENALOG) 0.1 % Apply 1 application topically 3 (three) times daily. Avoid face and genitalia (Patient not taking: No sig reported) 45 g 0     Discharge Medications: Please see discharge summary for a list of discharge medications.  Relevant Imaging Results:  Relevant Lab Results:   Additional Information SS# [id number]  Joseph Art, LCSWA

## 2020-10-21 NOTE — TOC Initial Note (Signed)
Transition of Care [facility]) - Initial/Assessment Note    Patient Details  Name: Kelsey Thompson MRN: [id number] Date of Birth: [birth year]  [facility] CM/SW Contact:    [place] Cellar, RN Phone Number: 10/21/2020, 10:15 AM  Clinical Narrative:                 Call to Gae Gallop (Daughter)  [phone] states patient has lived at [facility] for over a year and is wheelchair bound at baseline. Daughter requested RN CM contact [facility] and find out what options patient has to return. 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         Patient Goals and CMS Choice        Expected Discharge Plan and Services                                                Prior Living Arrangements/Services                       Activities of Daily Living      Permission Sought/Granted                  Emotional Assessment              Admission diagnosis:  UTI Patient Active Problem List   Diagnosis Date Noted  . Altered behavior 10/20/2020  . Hemiparesis affecting left side as late effect of cerebrovascular accident (CVA) (HCC) 05/19/2019  . Paroxysmal atrial fibrillation (HCC) 01/21/2019  . Hyperlipidemia LDL goal <70 01/21/2019  . Embolic stroke (HCC) R brain s/p tPA d/t AF 01/16/2019  . Acute pain of left knee 07/17/2014  . Sinoatrial node dysfunction (HCC) 09/26/2011  . Cardiac pacemaker in situ 11/19/2008  . Essential hypertension 11/07/2008  . BRADYCARDIA 11/07/2008  . GERD 11/07/2008  . Diaphragmatic hernia 11/07/2008   PCP:  Mechele Claude, MD Pharmacy:   [phone] - [place], [place] - [address] [place] [address] Phone: [phone] Fax: [phone]  [facility] [address] [place] - [address] [place] [address] [place] [address] Phone: [phone] Fax:  [phone]     Social Determinants of Health (SDOH) Interventions    Readmission Risk Interventions No flowsheet data found.

## 2020-10-21 NOTE — Plan of Care (Signed)
  Problem: Acute Rehab OT Goals (only OT should resolve) Goal: Pt. Will Perform Eating Flowsheets (Taken 10/21/2020 1154) Pt Will Perform Eating:  with min assist  with adaptive utensils  sitting  bed level Goal: Pt. Will Perform Grooming Flowsheets (Taken 10/21/2020 1154) Pt Will Perform Grooming:  with min assist  sitting  bed level Goal: Pt. Will Perform Upper Body Dressing Flowsheets (Taken 10/21/2020 1154) Pt Will Perform Upper Body Dressing:  with min assist  sitting  bed level Goal: Pt. Will Perform Lower Body Dressing Flowsheets (Taken 10/21/2020 1154) Pt Will Perform Lower Body Dressing:  with mod assist  sitting/lateral leans  bed level  with adaptive equipment Goal: Pt. Will Transfer To Toilet Flowsheets (Taken 10/21/2020 1154) Pt Will Transfer to Toilet:  with mod assist  with min assist  stand pivot transfer  grab bars Goal: Pt/Caregiver Will Perform Home Exercise Program Flowsheets (Taken 10/21/2020 1154) Pt/caregiver will Perform Home Exercise Program:  Increased ROM  Increased strength  Both right and left upper extremity  With minimal assist  Navah Grondin OT, MOT

## 2020-10-22 DIAGNOSIS — G9340 Encephalopathy, unspecified: Secondary | ICD-10-CM | POA: Diagnosis not present

## 2020-10-22 LAB — URINE CULTURE: Culture: 40000 — AB

## 2020-10-22 NOTE — ED Notes (Signed)
Pt ate half a cup of ice cream with medication. Is calm and cooperative. Is resting at this time.

## 2020-10-22 NOTE — Progress Notes (Signed)
PT Cancellation Note  Patient Details Name: Kelsey Thompson MRN: [id number] DOB: July 27, 1920   Cancelled Treatment:      Attempted to see pt.  She was very distressed wanting to see her daughter, [name] or Lynden Ang.  Unable to tell me who Lynden Ang was except that she worked in the hospital.  Pt stating, please let me stay alive until my daughter gets here.  Therapist unable to console pt.  Attempted to sit up but pt adamant that she was not going to do anything until her daughter got there.  Treatment cancelled.   Virgina Organ, PT CLT [phone] 10/22/2020, 1:44 PM

## 2020-10-22 NOTE — TOC Progression Note (Signed)
CSW following up on SNF referral. Per Melissa at [facility] pts referral is still under review currently. CSW spoke with pts daughter again about sending pt for SNF placement. She is agreeable, she states she would prefer SNF in [facility], CSW spoke with admissions and they do not have any SNF beds until the end of next week. Pts daughter asked that CSW send referral out to SNF in [place]. CSW sent referral out. CSW updated that we will let her know as soon as there is a bed offer. Pts daughter states she understands.   CSW spoke to [place] with [facility] who states that pt cannot walk independently at baseline. She states pt has not been in the locked unit at [facility] due to it being full and pt has a roommate. [facility] is  worried about having pt return as she crawled out of the doors before being taken to the ED.

## 2020-10-22 NOTE — ED Notes (Signed)
Daughter [name] at bedside visiting with pt

## 2020-10-22 NOTE — ED Provider Notes (Addendum)
Pt continues to await placement.  She did eat a small amount for breakfast.  She remains stable.    Jacalyn Lefevre, MD 10/22/20 1225  Pt has been accepted back to [facility].  IVC rescinded.   Jacalyn Lefevre, MD 10/22/20 1530

## 2020-10-22 NOTE — ED Notes (Signed)
Pt upset, requesting her daughter come up to the hospital and see her because "I am not going to make it" VS stable Daughter Dois Davenport notified about pts request to see her.  Will cont to monitor

## 2020-10-23 NOTE — ED Notes (Signed)
Pt VS updated, pt resting quietly in bed, brief dry, BSC at bedside. NAD noted. Will continue to monitor.

## 2020-10-23 NOTE — ED Notes (Signed)
Pt woke up, took meds, and drank some water, now is going back to sleep

## 2020-10-24 MED ORDER — POLYETHYLENE GLYCOL 3350 17 G PO PACK
17.0000 g | PACK | Freq: Every day | ORAL | Status: DC
  Administered 2020-10-26: 17 g via ORAL
  Filled 2020-10-24 (×2): qty 1

## 2020-10-24 MED ORDER — ONDANSETRON 4 MG PO TBDP
4.0000 mg | ORAL_TABLET | Freq: Once | ORAL | Status: AC
  Administered 2020-10-24: 4 mg via ORAL
  Filled 2020-10-24: qty 1

## 2020-10-24 NOTE — ED Notes (Signed)
Hard stool noted at rectum. Rectal vault cleared and medium amount of feces noted. EDP will be made aware.

## 2020-10-25 NOTE — ED Notes (Signed)
Pt up with PT in room. Assisted back to bed in position of comfort. Attempted to assist feeding pt, she would only lick the gravy and then refused all the food. All she wanted was "just some sips of water" and refused other beverages as well. Still refuses medications with and without use of food to administer.

## 2020-10-25 NOTE — ED Notes (Signed)
Pt called out- without call bell- stating she needed to pee. Stood patient and turned and pivot with x1 assist to bedside commode.  Void x1. Assisted back to bed. 78ml po intake water.

## 2020-10-25 NOTE — ED Notes (Signed)
Pt continues to take sips of water and Ginger Ale, refuses PO medications. Ate a few spoons of apple sauce. Currently will not allow repeat VS, asking "just let me rest." RR even and nonlabored.

## 2020-10-25 NOTE — Progress Notes (Signed)
Occupational Therapy Treatment Patient Details Name: Kelsey Thompson MRN: [id number] DOB: [birth year] Today's Date: 10/25/2020    History of present illness 87 year old female brought into the ER with chief complaint of altered mental status and hostile behavior.   Level 5 caveat for mental status change.   OT comments  Pt pleasant and agreeable to OT treatment this date. Pt completed x3 reps of sit to stand requiring mod to max A using RW. Pt noted to display posterior lean seeking to remain seated on elevated EOB. Pt required Mod to Max A for sideling to sit and sit to supine. Pt presenting less agitated and more compliant with following 1 step directions. Pt will benefit from continued OT services in the hospital setting and venue below to address deficit areas.   Follow Up Recommendations  SNF;Supervision/Assistance - 24 hour    Equipment Recommendations  None recommended by OT          Precautions / Restrictions Precautions Precautions: Fall Restrictions Weight Bearing Restrictions: No       Mobility Bed Mobility Overal bed mobility: Needs Assistance Bed Mobility: Sit to Sidelying;Sit to Supine   Sidelying to sit: Mod assist;Max assist   Sit to supine: Mod assist;Max assist   General bed mobility comments: slow labored movement    Transfers Overall transfer level: Needs assistance Equipment used: Rolling walker (2 wheeled) Transfers: Sit to/from Stand;Lateral/Scoot Transfers Sit to Stand: Mod assist;Max assist        Lateral/Scoot Transfers: Mod assist;Max assist (Shuffling to R side) General transfer comment: slow labored movement; posterior lean when standing. Completed ~x3 reps of sit to stand and one rep of lateral shuffle.    Balance Overall balance assessment: Needs assistance Sitting-balance support: Feet unsupported Sitting balance-Leahy Scale: Fair (fair/good at EOB) Sitting balance - Comments: seated at EOB   Standing balance support: Bilateral  upper extremity supported;During functional activity Standing balance-Leahy Scale: Poor Standing balance comment: using RW (Posterior lean)                                                Cognition Arousal/Alertness: Awake/alert Behavior During Therapy: WFL for tasks assessed/performed Overall Cognitive Status: History of cognitive impairments - at baseline                                 General Comments: able to cooperate with verbal/tactile cueing                          Pertinent Vitals/ Pain       Pain Assessment: No/denies pain                                                          Frequency  Min 2X/week          OT Goals(current goals can now be found in the care plan section)     Acute Rehab OT Goals Patient Stated Goal: pt did not state goal OT Goal Formulation: Patient unable to participate in goal setting Time For Goal Achievement: 11/04/20 Potential to Achieve Goals: Poor ADL Goals  Pt Will Perform Eating: with min assist;with adaptive utensils;sitting;bed level Pt Will Perform Grooming: with min assist;sitting;bed level Pt Will Perform Upper Body Dressing: with min assist;sitting;bed level Pt Will Perform Lower Body Dressing: with mod assist;sitting/lateral leans;bed level;with adaptive equipment Pt Will Transfer to Toilet: with mod assist;with min assist;stand pivot transfer;grab bars Pt/caregiver will Perform Home Exercise Program: Increased ROM;Increased strength;Both right and left upper extremity;With minimal assist  Plan Discharge plan remains appropriate                                    End of Session Equipment Utilized During Treatment: Rolling walker  OT Visit Diagnosis: Unsteadiness on feet (R26.81);Muscle weakness (generalized) (M62.81);Hemiplegia and hemiparesis Hemiplegia - Right/Left: Left Hemiplegia - caused by:  (previous stroke)   Activity Tolerance  Patient tolerated treatment well   Patient Left in bed;with nursing/sitter in room             Time: 0113-0128 OT Time Calculation (min): 15 min  Charges: OT General Charges $OT Visit: 1 Visit OT Treatments $Self Care/Home Management : 8-22 mins  [facility] OT, MOT    Danie Chandler 10/25/2020, 3:58 PM

## 2020-10-25 NOTE — TOC Progression Note (Addendum)
CSW has made many calls to [facility] as pt currently has bed offer in the HUB. CSW has been unable to speak with anyone in admissions and has left multiple VM. CSW also spoke to [place] with [facility] who states that the DON has the referral for review and she is out sick, she will provide update tomorrow.   1:26pm: CSW spoke to [place] in admission with [address] She states they do have a bed. CSW confirmed with pts daughter [name] that bed offer has been made, she accepts bed offer. CSW started [employer], reference # [id number]. CSW awaiting insurance auth. [facility] may be able to take pt today if Berkley Harvey is approved if not they can take her tomorrow.

## 2020-10-25 NOTE — ED Notes (Signed)
Pt sleeping in bed. Will wake to voice. Attempting to give medications, pt pushes hands away and states "Please leave me alone so I can sleep." RR even and nonlabored. No s/s of distress. Will continue to monitor and attempt medications.

## 2020-10-26 DIAGNOSIS — R6889 Other general symptoms and signs: Secondary | ICD-10-CM | POA: Diagnosis not present

## 2020-10-26 DIAGNOSIS — K219 Gastro-esophageal reflux disease without esophagitis: Secondary | ICD-10-CM | POA: Diagnosis not present

## 2020-10-26 DIAGNOSIS — Z87891 Personal history of nicotine dependence: Secondary | ICD-10-CM | POA: Diagnosis not present

## 2020-10-26 DIAGNOSIS — I495 Sick sinus syndrome: Secondary | ICD-10-CM | POA: Diagnosis not present

## 2020-10-26 DIAGNOSIS — M6281 Muscle weakness (generalized): Secondary | ICD-10-CM | POA: Diagnosis not present

## 2020-10-26 DIAGNOSIS — Z743 Need for continuous supervision: Secondary | ICD-10-CM | POA: Diagnosis not present

## 2020-10-26 DIAGNOSIS — R2681 Unsteadiness on feet: Secondary | ICD-10-CM | POA: Diagnosis not present

## 2020-10-26 DIAGNOSIS — G9340 Encephalopathy, unspecified: Secondary | ICD-10-CM | POA: Diagnosis not present

## 2020-10-26 DIAGNOSIS — Z7401 Bed confinement status: Secondary | ICD-10-CM | POA: Diagnosis not present

## 2020-10-26 DIAGNOSIS — Z950 Presence of cardiac pacemaker: Secondary | ICD-10-CM | POA: Diagnosis not present

## 2020-10-26 DIAGNOSIS — R2689 Other abnormalities of gait and mobility: Secondary | ICD-10-CM | POA: Diagnosis not present

## 2020-10-26 DIAGNOSIS — I10 Essential (primary) hypertension: Secondary | ICD-10-CM | POA: Diagnosis not present

## 2020-10-26 DIAGNOSIS — R633 Feeding difficulties, unspecified: Secondary | ICD-10-CM | POA: Diagnosis not present

## 2020-10-26 DIAGNOSIS — Z79899 Other long term (current) drug therapy: Secondary | ICD-10-CM | POA: Diagnosis not present

## 2020-10-26 DIAGNOSIS — Z20822 Contact with and (suspected) exposure to covid-19: Secondary | ICD-10-CM | POA: Diagnosis not present

## 2020-10-26 DIAGNOSIS — Z8673 Personal history of transient ischemic attack (TIA), and cerebral infarction without residual deficits: Secondary | ICD-10-CM | POA: Diagnosis not present

## 2020-10-26 DIAGNOSIS — Z7901 Long term (current) use of anticoagulants: Secondary | ICD-10-CM | POA: Diagnosis not present

## 2020-10-26 DIAGNOSIS — I69352 Hemiplegia and hemiparesis following cerebral infarction affecting left dominant side: Secondary | ICD-10-CM | POA: Diagnosis not present

## 2020-10-26 DIAGNOSIS — I480 Paroxysmal atrial fibrillation: Secondary | ICD-10-CM | POA: Diagnosis not present

## 2020-10-26 LAB — RESP PANEL BY RT-PCR (FLU A&B, COVID) ARPGX2
Influenza A by PCR: NEGATIVE
Influenza B by PCR: NEGATIVE
SARS Coronavirus 2 by RT PCR: NEGATIVE

## 2020-10-26 NOTE — ED Notes (Signed)
Spoke with daughter [name] who states that she is aware that patient has been accepted at facility. Aware that patient will be discharged from this ED later today pending EMS transport.

## 2020-10-26 NOTE — TOC Progression Note (Signed)
Pt has received insurance authorization. Auth ID is [id number]. Start date 4/26 and the next review date is 4/28. The care coordinator on the case is [place]. Pt needs a COVID test prior to discharge, Dr. Gilford Raid has placed order for this. CSW spoke to admissions at [facility] they state the discharge paperwork needs to provide a brief summary of the pts time in the ED and have somewhere that this is the discharge summary. Pts current medications listed at the bottom. CSW explained the ED does not complete the traditional D/C summary as pt has not been admitted, they are understanding but ask that the above requirements be met. CSW update Dr. Gilford Raid of the facilities requests. CSW to fax all paperwork to facility when completed. TOC to follow.

## 2020-10-26 NOTE — TOC Progression Note (Signed)
CSW still awaiting insurance auth for pt. CSW has faxed updated clinicals to [facility] for review. TOC to follow with updates.

## 2020-10-26 NOTE — ED Provider Notes (Signed)
Pt was initially brought here on 4/17 under IVC taken out by her daughter due to confusion and agitation.  She had been residing at an ALF.  Pt has been cleared by psych and the IVC was rescinded on 4/22.  She has been medically cleared.  She was set to go back to [facility], but her daughter did not want her to go there.  SW has been working on placement.  Pt has been intermittently taking meds.  She has had a poor appetite, but has been drinking fluids.  She is stable for transfer to a facility.   Jacalyn Lefevre, MD 10/26/20 [phone]

## 2020-10-26 NOTE — TOC Transition Note (Signed)
CSW sent all needed documentation to [facility]. CSW confirmed all information was received and pt is approved to transition to [place] today. TOC updated ED staff. Report to be called to ED by RN and pt will be transferred. Pts COVID test has resulted negative. TOC signing off.

## 2020-10-26 NOTE — ED Notes (Signed)
Tried to feed patient she would not eat. She did take several sips of milk and several sips of ginger ale

## 2020-10-26 NOTE — ED Notes (Signed)
Patient resting quietly in bed at this time. Aware of location and knows she is currently in hospital. Took meds after convincing patient there were no "rocks" in meds. Patient requesting water and orange juice which was provided.

## 2020-10-26 NOTE — ED Notes (Signed)
In room to assist patient with lunch and she refuses to eat. States "I don't want food, I just want a few sips of water."

## 2020-10-26 NOTE — ED Notes (Signed)
EMS here to transport pt to SNF, care transferred at this time.

## 2020-10-26 NOTE — ED Notes (Signed)
Dr. Estell Harpin is aware that patient is being discharged to SNF today.

## 2020-10-29 LAB — VITAMIN B1: Vitamin B1 (Thiamine): 108 nmol/L (ref 66.5–200.0)

## 2020-11-01 DIAGNOSIS — M6281 Muscle weakness (generalized): Secondary | ICD-10-CM | POA: Diagnosis not present

## 2020-11-01 DIAGNOSIS — K219 Gastro-esophageal reflux disease without esophagitis: Secondary | ICD-10-CM | POA: Diagnosis not present

## 2020-11-01 DIAGNOSIS — I10 Essential (primary) hypertension: Secondary | ICD-10-CM | POA: Diagnosis not present

## 2020-11-01 DIAGNOSIS — I480 Paroxysmal atrial fibrillation: Secondary | ICD-10-CM | POA: Diagnosis not present

## 2020-11-01 DIAGNOSIS — I495 Sick sinus syndrome: Secondary | ICD-10-CM | POA: Diagnosis not present

## 2020-11-17 DIAGNOSIS — I480 Paroxysmal atrial fibrillation: Secondary | ICD-10-CM | POA: Diagnosis not present

## 2020-11-17 DIAGNOSIS — D649 Anemia, unspecified: Secondary | ICD-10-CM | POA: Diagnosis not present

## 2020-11-17 DIAGNOSIS — K219 Gastro-esophageal reflux disease without esophagitis: Secondary | ICD-10-CM | POA: Diagnosis not present

## 2020-11-17 DIAGNOSIS — I495 Sick sinus syndrome: Secondary | ICD-10-CM | POA: Diagnosis not present

## 2020-11-17 DIAGNOSIS — E785 Hyperlipidemia, unspecified: Secondary | ICD-10-CM | POA: Diagnosis not present

## 2020-12-15 DIAGNOSIS — K219 Gastro-esophageal reflux disease without esophagitis: Secondary | ICD-10-CM | POA: Diagnosis not present

## 2020-12-15 DIAGNOSIS — E785 Hyperlipidemia, unspecified: Secondary | ICD-10-CM | POA: Diagnosis not present

## 2020-12-15 DIAGNOSIS — I495 Sick sinus syndrome: Secondary | ICD-10-CM | POA: Diagnosis not present

## 2020-12-15 DIAGNOSIS — I480 Paroxysmal atrial fibrillation: Secondary | ICD-10-CM | POA: Diagnosis not present

## 2020-12-15 DIAGNOSIS — I10 Essential (primary) hypertension: Secondary | ICD-10-CM | POA: Diagnosis not present

## 2020-12-15 DIAGNOSIS — D649 Anemia, unspecified: Secondary | ICD-10-CM | POA: Diagnosis not present

## 2020-12-22 DIAGNOSIS — I10 Essential (primary) hypertension: Secondary | ICD-10-CM | POA: Diagnosis not present

## 2020-12-22 DIAGNOSIS — I480 Paroxysmal atrial fibrillation: Secondary | ICD-10-CM | POA: Diagnosis not present

## 2020-12-22 DIAGNOSIS — E785 Hyperlipidemia, unspecified: Secondary | ICD-10-CM | POA: Diagnosis not present

## 2020-12-28 ENCOUNTER — Ambulatory Visit (INDEPENDENT_AMBULATORY_CARE_PROVIDER_SITE_OTHER): Payer: Medicare Other

## 2020-12-28 DIAGNOSIS — I495 Sick sinus syndrome: Secondary | ICD-10-CM

## 2020-12-28 LAB — CUP PACEART REMOTE DEVICE CHECK
Battery Impedance: 1795 Ohm
Battery Remaining Longevity: 41 mo
Battery Voltage: 2.75 V
Brady Statistic AP VP Percent: 0 %
Brady Statistic AP VS Percent: 68 %
Brady Statistic AS VP Percent: 0 %
Brady Statistic AS VS Percent: 31 %
Date Time Interrogation Session: 20220628093031
Implantable Lead Implant Date: 20100215
Implantable Lead Implant Date: 20100215
Implantable Lead Location: 753859
Implantable Lead Location: 753860
Implantable Lead Model: 5076
Implantable Lead Model: 5076
Implantable Pulse Generator Implant Date: 20100215
Lead Channel Impedance Value: 426 Ohm
Lead Channel Impedance Value: 624 Ohm
Lead Channel Pacing Threshold Amplitude: 0.375 V
Lead Channel Pacing Threshold Amplitude: 0.875 V
Lead Channel Pacing Threshold Pulse Width: 0.4 ms
Lead Channel Pacing Threshold Pulse Width: 0.4 ms
Lead Channel Setting Pacing Amplitude: 2 V
Lead Channel Setting Pacing Amplitude: 2.5 V
Lead Channel Setting Pacing Pulse Width: 0.4 ms
Lead Channel Setting Sensing Sensitivity: 2.8 mV

## 2020-12-30 DIAGNOSIS — I10 Essential (primary) hypertension: Secondary | ICD-10-CM | POA: Diagnosis not present

## 2021-01-11 DIAGNOSIS — I495 Sick sinus syndrome: Secondary | ICD-10-CM | POA: Diagnosis not present

## 2021-01-11 DIAGNOSIS — D649 Anemia, unspecified: Secondary | ICD-10-CM | POA: Diagnosis not present

## 2021-01-11 DIAGNOSIS — I10 Essential (primary) hypertension: Secondary | ICD-10-CM | POA: Diagnosis not present

## 2021-01-11 DIAGNOSIS — E785 Hyperlipidemia, unspecified: Secondary | ICD-10-CM | POA: Diagnosis not present

## 2021-01-11 DIAGNOSIS — I480 Paroxysmal atrial fibrillation: Secondary | ICD-10-CM | POA: Diagnosis not present

## 2021-01-11 DIAGNOSIS — K219 Gastro-esophageal reflux disease without esophagitis: Secondary | ICD-10-CM | POA: Diagnosis not present

## 2021-01-11 DIAGNOSIS — U071 COVID-19: Secondary | ICD-10-CM | POA: Diagnosis not present

## 2021-01-11 DIAGNOSIS — I69359 Hemiplegia and hemiparesis following cerebral infarction affecting unspecified side: Secondary | ICD-10-CM | POA: Diagnosis not present

## 2021-01-17 DIAGNOSIS — I10 Essential (primary) hypertension: Secondary | ICD-10-CM | POA: Diagnosis not present

## 2021-01-17 DIAGNOSIS — E119 Type 2 diabetes mellitus without complications: Secondary | ICD-10-CM | POA: Diagnosis not present

## 2021-01-17 DIAGNOSIS — E559 Vitamin D deficiency, unspecified: Secondary | ICD-10-CM | POA: Diagnosis not present

## 2021-01-17 DIAGNOSIS — E785 Hyperlipidemia, unspecified: Secondary | ICD-10-CM | POA: Diagnosis not present

## 2021-01-17 DIAGNOSIS — E038 Other specified hypothyroidism: Secondary | ICD-10-CM | POA: Diagnosis not present

## 2021-01-17 DIAGNOSIS — D518 Other vitamin B12 deficiency anemias: Secondary | ICD-10-CM | POA: Diagnosis not present

## 2021-01-17 DIAGNOSIS — I480 Paroxysmal atrial fibrillation: Secondary | ICD-10-CM | POA: Diagnosis not present

## 2021-01-17 DIAGNOSIS — E7849 Other hyperlipidemia: Secondary | ICD-10-CM | POA: Diagnosis not present

## 2021-01-17 DIAGNOSIS — Z79899 Other long term (current) drug therapy: Secondary | ICD-10-CM | POA: Diagnosis not present

## 2021-01-17 NOTE — Progress Notes (Signed)
Remote pacemaker transmission.   

## 2021-01-27 DIAGNOSIS — R011 Cardiac murmur, unspecified: Secondary | ICD-10-CM | POA: Diagnosis not present

## 2021-02-08 DIAGNOSIS — E785 Hyperlipidemia, unspecified: Secondary | ICD-10-CM | POA: Diagnosis not present

## 2021-02-08 DIAGNOSIS — K219 Gastro-esophageal reflux disease without esophagitis: Secondary | ICD-10-CM | POA: Diagnosis not present

## 2021-02-08 DIAGNOSIS — I10 Essential (primary) hypertension: Secondary | ICD-10-CM | POA: Diagnosis not present

## 2021-02-08 DIAGNOSIS — I480 Paroxysmal atrial fibrillation: Secondary | ICD-10-CM | POA: Diagnosis not present

## 2021-02-08 DIAGNOSIS — D649 Anemia, unspecified: Secondary | ICD-10-CM | POA: Diagnosis not present

## 2021-02-10 DIAGNOSIS — E119 Type 2 diabetes mellitus without complications: Secondary | ICD-10-CM | POA: Diagnosis not present

## 2021-02-10 DIAGNOSIS — I10 Essential (primary) hypertension: Secondary | ICD-10-CM | POA: Diagnosis not present

## 2021-02-10 DIAGNOSIS — E038 Other specified hypothyroidism: Secondary | ICD-10-CM | POA: Diagnosis not present

## 2021-02-10 DIAGNOSIS — D518 Other vitamin B12 deficiency anemias: Secondary | ICD-10-CM | POA: Diagnosis not present

## 2021-02-10 DIAGNOSIS — I480 Paroxysmal atrial fibrillation: Secondary | ICD-10-CM | POA: Diagnosis not present

## 2021-02-10 DIAGNOSIS — E559 Vitamin D deficiency, unspecified: Secondary | ICD-10-CM | POA: Diagnosis not present

## 2021-02-10 DIAGNOSIS — E785 Hyperlipidemia, unspecified: Secondary | ICD-10-CM | POA: Diagnosis not present

## 2021-02-18 DIAGNOSIS — I10 Essential (primary) hypertension: Secondary | ICD-10-CM | POA: Diagnosis not present

## 2021-03-04 DIAGNOSIS — I10 Essential (primary) hypertension: Secondary | ICD-10-CM | POA: Diagnosis not present

## 2021-03-08 DIAGNOSIS — R1310 Dysphagia, unspecified: Secondary | ICD-10-CM | POA: Diagnosis not present

## 2021-03-08 DIAGNOSIS — D649 Anemia, unspecified: Secondary | ICD-10-CM | POA: Diagnosis not present

## 2021-03-08 DIAGNOSIS — I10 Essential (primary) hypertension: Secondary | ICD-10-CM | POA: Diagnosis not present

## 2021-03-08 DIAGNOSIS — K219 Gastro-esophageal reflux disease without esophagitis: Secondary | ICD-10-CM | POA: Diagnosis not present

## 2021-03-08 DIAGNOSIS — E785 Hyperlipidemia, unspecified: Secondary | ICD-10-CM | POA: Diagnosis not present

## 2021-03-08 DIAGNOSIS — I480 Paroxysmal atrial fibrillation: Secondary | ICD-10-CM | POA: Diagnosis not present

## 2021-03-11 ENCOUNTER — Ambulatory Visit: Payer: Medicare Other

## 2021-03-14 DIAGNOSIS — I69318 Other symptoms and signs involving cognitive functions following cerebral infarction: Secondary | ICD-10-CM | POA: Diagnosis not present

## 2021-03-14 DIAGNOSIS — I10 Essential (primary) hypertension: Secondary | ICD-10-CM | POA: Diagnosis not present

## 2021-03-14 DIAGNOSIS — R1310 Dysphagia, unspecified: Secondary | ICD-10-CM | POA: Diagnosis not present

## 2021-03-14 DIAGNOSIS — E785 Hyperlipidemia, unspecified: Secondary | ICD-10-CM | POA: Diagnosis not present

## 2021-03-14 DIAGNOSIS — I480 Paroxysmal atrial fibrillation: Secondary | ICD-10-CM | POA: Diagnosis not present

## 2021-03-14 DIAGNOSIS — I69354 Hemiplegia and hemiparesis following cerebral infarction affecting left non-dominant side: Secondary | ICD-10-CM | POA: Diagnosis not present

## 2021-03-14 DIAGNOSIS — Z950 Presence of cardiac pacemaker: Secondary | ICD-10-CM | POA: Diagnosis not present

## 2021-03-21 DIAGNOSIS — I10 Essential (primary) hypertension: Secondary | ICD-10-CM | POA: Diagnosis not present

## 2021-03-21 DIAGNOSIS — E559 Vitamin D deficiency, unspecified: Secondary | ICD-10-CM | POA: Diagnosis not present

## 2021-03-21 DIAGNOSIS — D518 Other vitamin B12 deficiency anemias: Secondary | ICD-10-CM | POA: Diagnosis not present

## 2021-03-21 DIAGNOSIS — E038 Other specified hypothyroidism: Secondary | ICD-10-CM | POA: Diagnosis not present

## 2021-03-21 DIAGNOSIS — E119 Type 2 diabetes mellitus without complications: Secondary | ICD-10-CM | POA: Diagnosis not present

## 2021-03-21 DIAGNOSIS — I480 Paroxysmal atrial fibrillation: Secondary | ICD-10-CM | POA: Diagnosis not present

## 2021-03-21 DIAGNOSIS — E785 Hyperlipidemia, unspecified: Secondary | ICD-10-CM | POA: Diagnosis not present

## 2021-03-22 DIAGNOSIS — I480 Paroxysmal atrial fibrillation: Secondary | ICD-10-CM | POA: Diagnosis not present

## 2021-03-22 DIAGNOSIS — I69318 Other symptoms and signs involving cognitive functions following cerebral infarction: Secondary | ICD-10-CM | POA: Diagnosis not present

## 2021-03-22 DIAGNOSIS — I10 Essential (primary) hypertension: Secondary | ICD-10-CM | POA: Diagnosis not present

## 2021-03-22 DIAGNOSIS — R1310 Dysphagia, unspecified: Secondary | ICD-10-CM | POA: Diagnosis not present

## 2021-03-22 DIAGNOSIS — Z950 Presence of cardiac pacemaker: Secondary | ICD-10-CM | POA: Diagnosis not present

## 2021-03-22 DIAGNOSIS — E785 Hyperlipidemia, unspecified: Secondary | ICD-10-CM | POA: Diagnosis not present

## 2021-03-22 DIAGNOSIS — I69354 Hemiplegia and hemiparesis following cerebral infarction affecting left non-dominant side: Secondary | ICD-10-CM | POA: Diagnosis not present

## 2021-03-29 ENCOUNTER — Ambulatory Visit (INDEPENDENT_AMBULATORY_CARE_PROVIDER_SITE_OTHER): Payer: Medicare Other

## 2021-03-29 DIAGNOSIS — E785 Hyperlipidemia, unspecified: Secondary | ICD-10-CM | POA: Diagnosis not present

## 2021-03-29 DIAGNOSIS — I69318 Other symptoms and signs involving cognitive functions following cerebral infarction: Secondary | ICD-10-CM | POA: Diagnosis not present

## 2021-03-29 DIAGNOSIS — I495 Sick sinus syndrome: Secondary | ICD-10-CM

## 2021-03-29 DIAGNOSIS — R1310 Dysphagia, unspecified: Secondary | ICD-10-CM | POA: Diagnosis not present

## 2021-03-29 DIAGNOSIS — I480 Paroxysmal atrial fibrillation: Secondary | ICD-10-CM | POA: Diagnosis not present

## 2021-03-29 DIAGNOSIS — I10 Essential (primary) hypertension: Secondary | ICD-10-CM | POA: Diagnosis not present

## 2021-03-29 DIAGNOSIS — Z950 Presence of cardiac pacemaker: Secondary | ICD-10-CM | POA: Diagnosis not present

## 2021-03-29 DIAGNOSIS — I69354 Hemiplegia and hemiparesis following cerebral infarction affecting left non-dominant side: Secondary | ICD-10-CM | POA: Diagnosis not present

## 2021-03-30 LAB — CUP PACEART REMOTE DEVICE CHECK
Battery Impedance: 1825 Ohm
Battery Remaining Longevity: 40 mo
Battery Voltage: 2.75 V
Brady Statistic AP VP Percent: 0 %
Brady Statistic AP VS Percent: 70 %
Brady Statistic AS VP Percent: 0 %
Brady Statistic AS VS Percent: 29 %
Date Time Interrogation Session: 20220927110242
Implantable Lead Implant Date: 20100215
Implantable Lead Implant Date: 20100215
Implantable Lead Location: 753859
Implantable Lead Location: 753860
Implantable Lead Model: 5076
Implantable Lead Model: 5076
Implantable Pulse Generator Implant Date: 20100215
Lead Channel Impedance Value: 432 Ohm
Lead Channel Impedance Value: 656 Ohm
Lead Channel Pacing Threshold Amplitude: 0.375 V
Lead Channel Pacing Threshold Amplitude: 0.875 V
Lead Channel Pacing Threshold Pulse Width: 0.4 ms
Lead Channel Pacing Threshold Pulse Width: 0.4 ms
Lead Channel Setting Pacing Amplitude: 2 V
Lead Channel Setting Pacing Amplitude: 2.5 V
Lead Channel Setting Pacing Pulse Width: 0.4 ms
Lead Channel Setting Sensing Sensitivity: 4 mV

## 2021-04-04 DIAGNOSIS — I69354 Hemiplegia and hemiparesis following cerebral infarction affecting left non-dominant side: Secondary | ICD-10-CM | POA: Diagnosis not present

## 2021-04-04 DIAGNOSIS — E785 Hyperlipidemia, unspecified: Secondary | ICD-10-CM | POA: Diagnosis not present

## 2021-04-04 DIAGNOSIS — I480 Paroxysmal atrial fibrillation: Secondary | ICD-10-CM | POA: Diagnosis not present

## 2021-04-04 DIAGNOSIS — I69318 Other symptoms and signs involving cognitive functions following cerebral infarction: Secondary | ICD-10-CM | POA: Diagnosis not present

## 2021-04-04 DIAGNOSIS — R1310 Dysphagia, unspecified: Secondary | ICD-10-CM | POA: Diagnosis not present

## 2021-04-05 DIAGNOSIS — I69354 Hemiplegia and hemiparesis following cerebral infarction affecting left non-dominant side: Secondary | ICD-10-CM | POA: Diagnosis not present

## 2021-04-05 DIAGNOSIS — E559 Vitamin D deficiency, unspecified: Secondary | ICD-10-CM | POA: Diagnosis not present

## 2021-04-05 DIAGNOSIS — K219 Gastro-esophageal reflux disease without esophagitis: Secondary | ICD-10-CM | POA: Diagnosis not present

## 2021-04-05 DIAGNOSIS — I69318 Other symptoms and signs involving cognitive functions following cerebral infarction: Secondary | ICD-10-CM | POA: Diagnosis not present

## 2021-04-05 DIAGNOSIS — I10 Essential (primary) hypertension: Secondary | ICD-10-CM | POA: Diagnosis not present

## 2021-04-05 DIAGNOSIS — D649 Anemia, unspecified: Secondary | ICD-10-CM | POA: Diagnosis not present

## 2021-04-05 DIAGNOSIS — J302 Other seasonal allergic rhinitis: Secondary | ICD-10-CM | POA: Diagnosis not present

## 2021-04-05 DIAGNOSIS — I495 Sick sinus syndrome: Secondary | ICD-10-CM | POA: Diagnosis not present

## 2021-04-05 DIAGNOSIS — F01518 Vascular dementia, unspecified severity, with other behavioral disturbance: Secondary | ICD-10-CM | POA: Diagnosis not present

## 2021-04-05 DIAGNOSIS — Z79899 Other long term (current) drug therapy: Secondary | ICD-10-CM | POA: Diagnosis not present

## 2021-04-05 DIAGNOSIS — Z950 Presence of cardiac pacemaker: Secondary | ICD-10-CM | POA: Diagnosis not present

## 2021-04-05 DIAGNOSIS — I480 Paroxysmal atrial fibrillation: Secondary | ICD-10-CM | POA: Diagnosis not present

## 2021-04-05 DIAGNOSIS — D518 Other vitamin B12 deficiency anemias: Secondary | ICD-10-CM | POA: Diagnosis not present

## 2021-04-05 DIAGNOSIS — E785 Hyperlipidemia, unspecified: Secondary | ICD-10-CM | POA: Diagnosis not present

## 2021-04-05 DIAGNOSIS — R1310 Dysphagia, unspecified: Secondary | ICD-10-CM | POA: Diagnosis not present

## 2021-04-05 DIAGNOSIS — E119 Type 2 diabetes mellitus without complications: Secondary | ICD-10-CM | POA: Diagnosis not present

## 2021-04-05 DIAGNOSIS — E038 Other specified hypothyroidism: Secondary | ICD-10-CM | POA: Diagnosis not present

## 2021-04-05 DIAGNOSIS — E7849 Other hyperlipidemia: Secondary | ICD-10-CM | POA: Diagnosis not present

## 2021-04-05 NOTE — Progress Notes (Signed)
Remote pacemaker transmission.   

## 2021-04-11 DIAGNOSIS — I7091 Generalized atherosclerosis: Secondary | ICD-10-CM | POA: Diagnosis not present

## 2021-04-11 DIAGNOSIS — L603 Nail dystrophy: Secondary | ICD-10-CM | POA: Diagnosis not present

## 2021-04-11 DIAGNOSIS — B351 Tinea unguium: Secondary | ICD-10-CM | POA: Diagnosis not present

## 2021-04-12 DIAGNOSIS — R410 Disorientation, unspecified: Secondary | ICD-10-CM | POA: Diagnosis not present

## 2021-04-12 DIAGNOSIS — G4700 Insomnia, unspecified: Secondary | ICD-10-CM | POA: Diagnosis not present

## 2021-04-13 DIAGNOSIS — E559 Vitamin D deficiency, unspecified: Secondary | ICD-10-CM | POA: Diagnosis not present

## 2021-04-13 DIAGNOSIS — D518 Other vitamin B12 deficiency anemias: Secondary | ICD-10-CM | POA: Diagnosis not present

## 2021-04-13 DIAGNOSIS — E785 Hyperlipidemia, unspecified: Secondary | ICD-10-CM | POA: Diagnosis not present

## 2021-04-13 DIAGNOSIS — I480 Paroxysmal atrial fibrillation: Secondary | ICD-10-CM | POA: Diagnosis not present

## 2021-04-13 DIAGNOSIS — I10 Essential (primary) hypertension: Secondary | ICD-10-CM | POA: Diagnosis not present

## 2021-04-13 DIAGNOSIS — E119 Type 2 diabetes mellitus without complications: Secondary | ICD-10-CM | POA: Diagnosis not present

## 2021-04-13 DIAGNOSIS — E038 Other specified hypothyroidism: Secondary | ICD-10-CM | POA: Diagnosis not present

## 2021-04-21 DIAGNOSIS — I495 Sick sinus syndrome: Secondary | ICD-10-CM | POA: Diagnosis not present

## 2021-04-21 DIAGNOSIS — K219 Gastro-esophageal reflux disease without esophagitis: Secondary | ICD-10-CM | POA: Diagnosis not present

## 2021-04-21 DIAGNOSIS — I10 Essential (primary) hypertension: Secondary | ICD-10-CM | POA: Diagnosis not present

## 2021-04-21 DIAGNOSIS — I480 Paroxysmal atrial fibrillation: Secondary | ICD-10-CM | POA: Diagnosis not present

## 2021-04-21 DIAGNOSIS — D649 Anemia, unspecified: Secondary | ICD-10-CM | POA: Diagnosis not present

## 2021-04-21 DIAGNOSIS — G4751 Confusional arousals: Secondary | ICD-10-CM | POA: Diagnosis not present

## 2021-04-21 DIAGNOSIS — R0689 Other abnormalities of breathing: Secondary | ICD-10-CM | POA: Diagnosis not present

## 2021-04-21 DIAGNOSIS — R410 Disorientation, unspecified: Secondary | ICD-10-CM | POA: Diagnosis not present

## 2021-04-21 DIAGNOSIS — E785 Hyperlipidemia, unspecified: Secondary | ICD-10-CM | POA: Diagnosis not present

## 2021-04-22 DIAGNOSIS — D518 Other vitamin B12 deficiency anemias: Secondary | ICD-10-CM | POA: Diagnosis not present

## 2021-04-22 DIAGNOSIS — E7849 Other hyperlipidemia: Secondary | ICD-10-CM | POA: Diagnosis not present

## 2021-04-22 DIAGNOSIS — R0602 Shortness of breath: Secondary | ICD-10-CM | POA: Diagnosis not present

## 2021-04-22 DIAGNOSIS — Z79899 Other long term (current) drug therapy: Secondary | ICD-10-CM | POA: Diagnosis not present

## 2021-04-22 DIAGNOSIS — E119 Type 2 diabetes mellitus without complications: Secondary | ICD-10-CM | POA: Diagnosis not present

## 2021-04-25 DIAGNOSIS — E860 Dehydration: Secondary | ICD-10-CM | POA: Diagnosis not present

## 2021-04-25 DIAGNOSIS — N390 Urinary tract infection, site not specified: Secondary | ICD-10-CM | POA: Diagnosis not present

## 2021-04-25 DIAGNOSIS — Z880 Allergy status to penicillin: Secondary | ICD-10-CM | POA: Diagnosis not present

## 2021-04-26 DIAGNOSIS — E871 Hypo-osmolality and hyponatremia: Secondary | ICD-10-CM | POA: Diagnosis not present

## 2021-04-26 DIAGNOSIS — N390 Urinary tract infection, site not specified: Secondary | ICD-10-CM | POA: Diagnosis not present

## 2021-04-26 DIAGNOSIS — Z79899 Other long term (current) drug therapy: Secondary | ICD-10-CM | POA: Diagnosis not present

## 2021-04-27 DIAGNOSIS — I10 Essential (primary) hypertension: Secondary | ICD-10-CM | POA: Diagnosis not present

## 2021-04-28 DIAGNOSIS — Z79899 Other long term (current) drug therapy: Secondary | ICD-10-CM | POA: Diagnosis not present

## 2021-04-28 DIAGNOSIS — E119 Type 2 diabetes mellitus without complications: Secondary | ICD-10-CM | POA: Diagnosis not present

## 2021-04-28 DIAGNOSIS — E7849 Other hyperlipidemia: Secondary | ICD-10-CM | POA: Diagnosis not present

## 2021-04-28 DIAGNOSIS — D518 Other vitamin B12 deficiency anemias: Secondary | ICD-10-CM | POA: Diagnosis not present

## 2021-05-03 DIAGNOSIS — R451 Restlessness and agitation: Secondary | ICD-10-CM | POA: Diagnosis not present

## 2021-05-03 DIAGNOSIS — R410 Disorientation, unspecified: Secondary | ICD-10-CM | POA: Diagnosis not present

## 2021-05-03 DIAGNOSIS — I480 Paroxysmal atrial fibrillation: Secondary | ICD-10-CM | POA: Diagnosis not present

## 2021-05-03 DIAGNOSIS — E785 Hyperlipidemia, unspecified: Secondary | ICD-10-CM | POA: Diagnosis not present

## 2021-05-03 DIAGNOSIS — I10 Essential (primary) hypertension: Secondary | ICD-10-CM | POA: Diagnosis not present

## 2021-05-03 DIAGNOSIS — K219 Gastro-esophageal reflux disease without esophagitis: Secondary | ICD-10-CM | POA: Diagnosis not present

## 2021-05-03 DIAGNOSIS — E871 Hypo-osmolality and hyponatremia: Secondary | ICD-10-CM | POA: Diagnosis not present

## 2021-05-03 DIAGNOSIS — I495 Sick sinus syndrome: Secondary | ICD-10-CM | POA: Diagnosis not present

## 2021-05-11 DIAGNOSIS — E119 Type 2 diabetes mellitus without complications: Secondary | ICD-10-CM | POA: Diagnosis not present

## 2021-05-11 DIAGNOSIS — D518 Other vitamin B12 deficiency anemias: Secondary | ICD-10-CM | POA: Diagnosis not present

## 2021-05-11 DIAGNOSIS — Z79899 Other long term (current) drug therapy: Secondary | ICD-10-CM | POA: Diagnosis not present

## 2021-05-11 DIAGNOSIS — E7849 Other hyperlipidemia: Secondary | ICD-10-CM | POA: Diagnosis not present

## 2021-05-12 DIAGNOSIS — E559 Vitamin D deficiency, unspecified: Secondary | ICD-10-CM | POA: Diagnosis not present

## 2021-05-12 DIAGNOSIS — D518 Other vitamin B12 deficiency anemias: Secondary | ICD-10-CM | POA: Diagnosis not present

## 2021-05-12 DIAGNOSIS — I10 Essential (primary) hypertension: Secondary | ICD-10-CM | POA: Diagnosis not present

## 2021-05-12 DIAGNOSIS — E038 Other specified hypothyroidism: Secondary | ICD-10-CM | POA: Diagnosis not present

## 2021-05-12 DIAGNOSIS — E119 Type 2 diabetes mellitus without complications: Secondary | ICD-10-CM | POA: Diagnosis not present

## 2021-05-12 DIAGNOSIS — I480 Paroxysmal atrial fibrillation: Secondary | ICD-10-CM | POA: Diagnosis not present

## 2021-05-12 DIAGNOSIS — E785 Hyperlipidemia, unspecified: Secondary | ICD-10-CM | POA: Diagnosis not present

## 2021-05-13 DIAGNOSIS — R410 Disorientation, unspecified: Secondary | ICD-10-CM | POA: Diagnosis not present

## 2021-05-13 DIAGNOSIS — E871 Hypo-osmolality and hyponatremia: Secondary | ICD-10-CM | POA: Diagnosis not present

## 2021-05-13 DIAGNOSIS — Z79899 Other long term (current) drug therapy: Secondary | ICD-10-CM | POA: Diagnosis not present

## 2021-05-27 DIAGNOSIS — R059 Cough, unspecified: Secondary | ICD-10-CM | POA: Diagnosis not present

## 2021-05-31 DIAGNOSIS — E785 Hyperlipidemia, unspecified: Secondary | ICD-10-CM | POA: Diagnosis not present

## 2021-05-31 DIAGNOSIS — R410 Disorientation, unspecified: Secondary | ICD-10-CM | POA: Diagnosis not present

## 2021-05-31 DIAGNOSIS — I10 Essential (primary) hypertension: Secondary | ICD-10-CM | POA: Diagnosis not present

## 2021-05-31 DIAGNOSIS — I480 Paroxysmal atrial fibrillation: Secondary | ICD-10-CM | POA: Diagnosis not present

## 2021-05-31 DIAGNOSIS — J069 Acute upper respiratory infection, unspecified: Secondary | ICD-10-CM | POA: Diagnosis not present

## 2021-06-01 DIAGNOSIS — I10 Essential (primary) hypertension: Secondary | ICD-10-CM | POA: Diagnosis not present

## 2021-06-10 DIAGNOSIS — E559 Vitamin D deficiency, unspecified: Secondary | ICD-10-CM | POA: Diagnosis not present

## 2021-06-10 DIAGNOSIS — I480 Paroxysmal atrial fibrillation: Secondary | ICD-10-CM | POA: Diagnosis not present

## 2021-06-10 DIAGNOSIS — E785 Hyperlipidemia, unspecified: Secondary | ICD-10-CM | POA: Diagnosis not present

## 2021-06-10 DIAGNOSIS — I10 Essential (primary) hypertension: Secondary | ICD-10-CM | POA: Diagnosis not present

## 2021-06-10 DIAGNOSIS — D518 Other vitamin B12 deficiency anemias: Secondary | ICD-10-CM | POA: Diagnosis not present

## 2021-06-10 DIAGNOSIS — E038 Other specified hypothyroidism: Secondary | ICD-10-CM | POA: Diagnosis not present

## 2021-06-10 DIAGNOSIS — E119 Type 2 diabetes mellitus without complications: Secondary | ICD-10-CM | POA: Diagnosis not present

## 2021-06-28 ENCOUNTER — Ambulatory Visit (INDEPENDENT_AMBULATORY_CARE_PROVIDER_SITE_OTHER): Payer: Medicare Other

## 2021-06-28 DIAGNOSIS — I495 Sick sinus syndrome: Secondary | ICD-10-CM

## 2021-06-28 DIAGNOSIS — I10 Essential (primary) hypertension: Secondary | ICD-10-CM | POA: Diagnosis not present

## 2021-06-28 LAB — CUP PACEART REMOTE DEVICE CHECK
Battery Impedance: 1977 Ohm
Battery Remaining Longevity: 37 mo
Battery Voltage: 2.74 V
Brady Statistic AP VP Percent: 1 %
Brady Statistic AP VS Percent: 71 %
Brady Statistic AS VP Percent: 0 %
Brady Statistic AS VS Percent: 28 %
Date Time Interrogation Session: 20221227095250
Implantable Lead Implant Date: 20100215
Implantable Lead Implant Date: 20100215
Implantable Lead Location: 753859
Implantable Lead Location: 753860
Implantable Lead Model: 5076
Implantable Lead Model: 5076
Implantable Pulse Generator Implant Date: 20100215
Lead Channel Impedance Value: 402 Ohm
Lead Channel Impedance Value: 601 Ohm
Lead Channel Pacing Threshold Amplitude: 0.5 V
Lead Channel Pacing Threshold Amplitude: 1 V
Lead Channel Pacing Threshold Pulse Width: 0.4 ms
Lead Channel Pacing Threshold Pulse Width: 0.4 ms
Lead Channel Setting Pacing Amplitude: 2 V
Lead Channel Setting Pacing Amplitude: 2.5 V
Lead Channel Setting Pacing Pulse Width: 0.4 ms
Lead Channel Setting Sensing Sensitivity: 2.8 mV

## 2021-06-30 DIAGNOSIS — R1310 Dysphagia, unspecified: Secondary | ICD-10-CM | POA: Diagnosis not present

## 2021-06-30 DIAGNOSIS — G245 Blepharospasm: Secondary | ICD-10-CM | POA: Diagnosis not present

## 2021-06-30 DIAGNOSIS — K219 Gastro-esophageal reflux disease without esophagitis: Secondary | ICD-10-CM | POA: Diagnosis not present

## 2021-06-30 DIAGNOSIS — D649 Anemia, unspecified: Secondary | ICD-10-CM | POA: Diagnosis not present

## 2021-06-30 DIAGNOSIS — G4700 Insomnia, unspecified: Secondary | ICD-10-CM | POA: Diagnosis not present

## 2021-06-30 DIAGNOSIS — I480 Paroxysmal atrial fibrillation: Secondary | ICD-10-CM | POA: Diagnosis not present

## 2021-06-30 DIAGNOSIS — E871 Hypo-osmolality and hyponatremia: Secondary | ICD-10-CM | POA: Diagnosis not present

## 2021-06-30 DIAGNOSIS — I495 Sick sinus syndrome: Secondary | ICD-10-CM | POA: Diagnosis not present

## 2021-06-30 DIAGNOSIS — E785 Hyperlipidemia, unspecified: Secondary | ICD-10-CM | POA: Diagnosis not present

## 2021-07-06 DIAGNOSIS — E119 Type 2 diabetes mellitus without complications: Secondary | ICD-10-CM | POA: Diagnosis not present

## 2021-07-06 DIAGNOSIS — E038 Other specified hypothyroidism: Secondary | ICD-10-CM | POA: Diagnosis not present

## 2021-07-06 DIAGNOSIS — D518 Other vitamin B12 deficiency anemias: Secondary | ICD-10-CM | POA: Diagnosis not present

## 2021-07-06 DIAGNOSIS — E785 Hyperlipidemia, unspecified: Secondary | ICD-10-CM | POA: Diagnosis not present

## 2021-07-06 DIAGNOSIS — I10 Essential (primary) hypertension: Secondary | ICD-10-CM | POA: Diagnosis not present

## 2021-07-06 DIAGNOSIS — I480 Paroxysmal atrial fibrillation: Secondary | ICD-10-CM | POA: Diagnosis not present

## 2021-07-06 DIAGNOSIS — E559 Vitamin D deficiency, unspecified: Secondary | ICD-10-CM | POA: Diagnosis not present

## 2021-07-06 NOTE — Progress Notes (Signed)
Remote pacemaker transmission.   

## 2021-07-08 DIAGNOSIS — E7849 Other hyperlipidemia: Secondary | ICD-10-CM | POA: Diagnosis not present

## 2021-07-08 DIAGNOSIS — E119 Type 2 diabetes mellitus without complications: Secondary | ICD-10-CM | POA: Diagnosis not present

## 2021-07-08 DIAGNOSIS — D518 Other vitamin B12 deficiency anemias: Secondary | ICD-10-CM | POA: Diagnosis not present

## 2021-07-08 DIAGNOSIS — Z79899 Other long term (current) drug therapy: Secondary | ICD-10-CM | POA: Diagnosis not present

## 2021-07-09 IMAGING — CT CT HEAD W/O CM
3 series · 15 of 47 positions shown, 18 images · non-contrast
Comparison: 01/19/2019

CLINICAL DATA: Altered mental status

EXAM:
CT HEAD WITHOUT CONTRAST
TECHNIQUE: Contiguous axial images were obtained from the base of the skull
through the vertex without intravenous contrast.

[Series 2: head w o · axial · 0.42mm/px · z∈[+66,+196]mm · 9 of 32 slices shown, 12 images]
[im 3/32  brain]
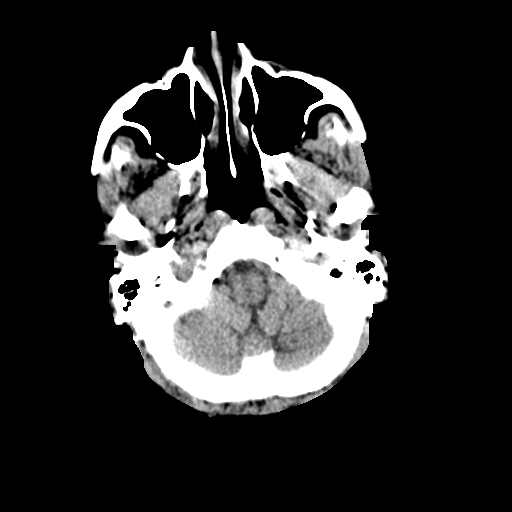
[im 3/32  bone]
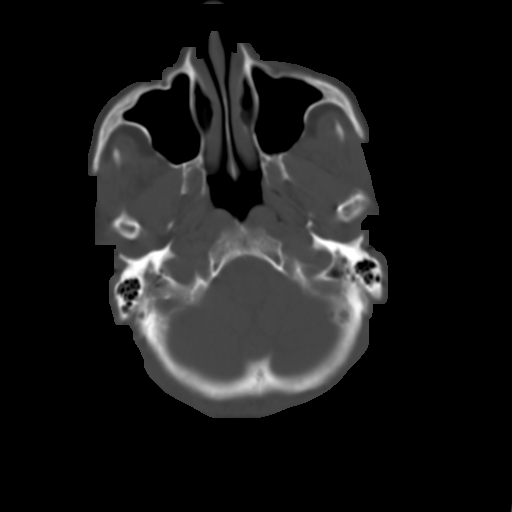
[im 6/32  brain]
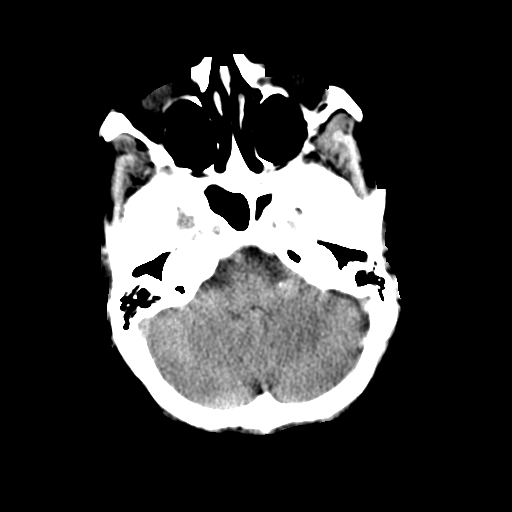
[im 9/32  brain]
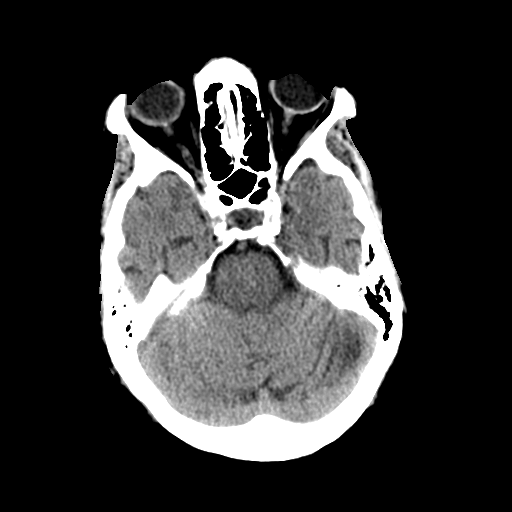
[im 12/32  brain]
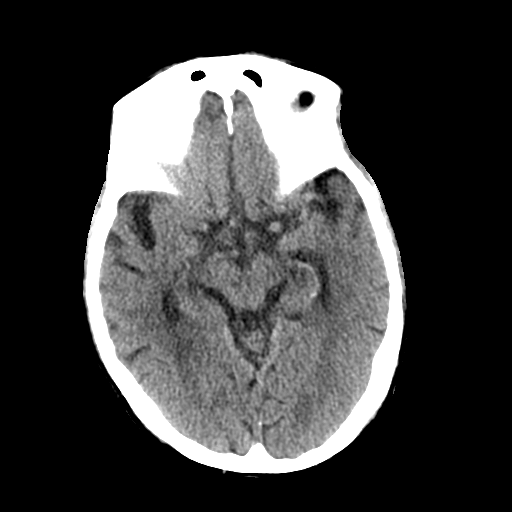
[im 17/32  brain]
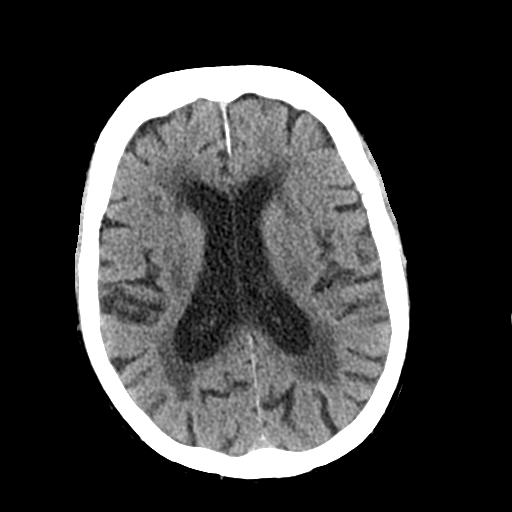
[im 17/32  bone]
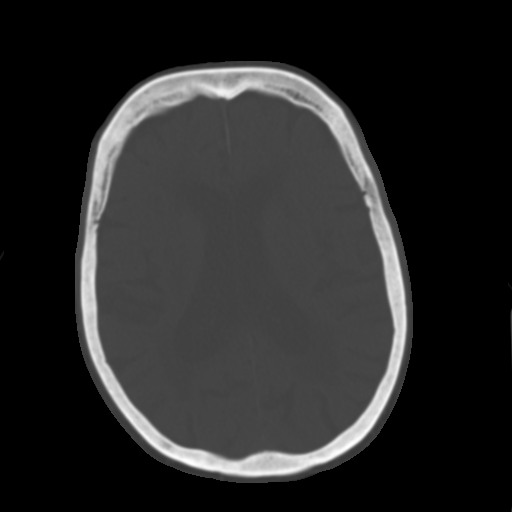
[im 20/32  brain]
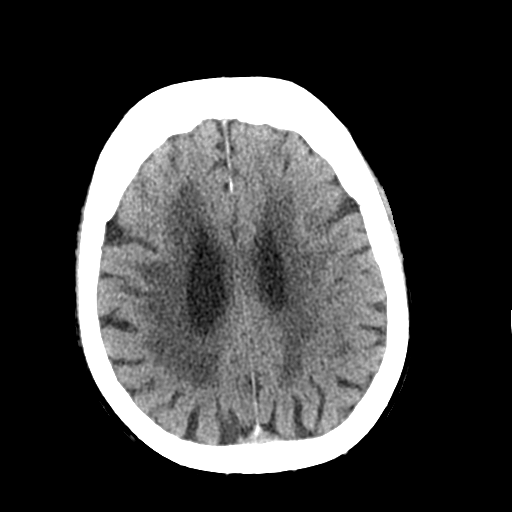
[im 23/32  brain]
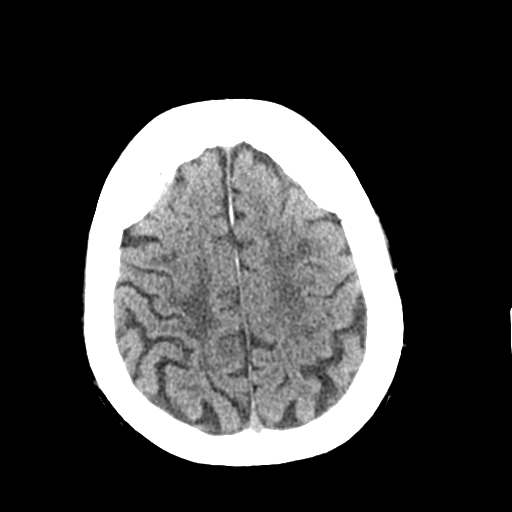
[im 26/32  brain]
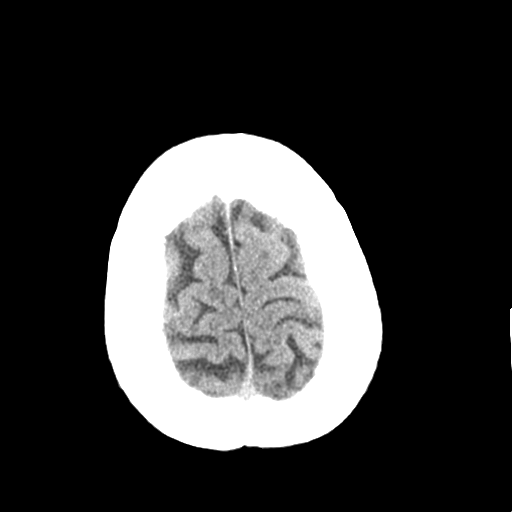
[im 29/32  brain]
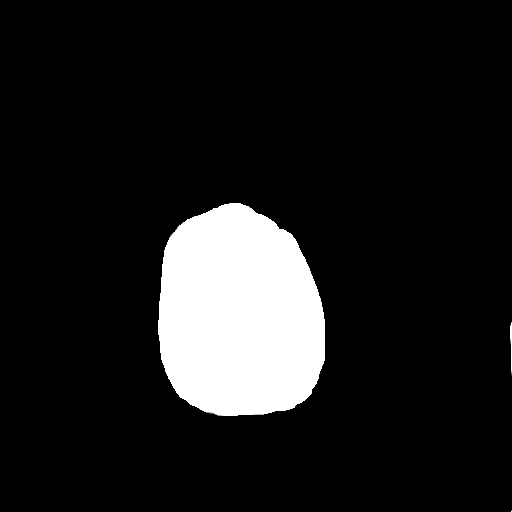
[im 29/32  bone]
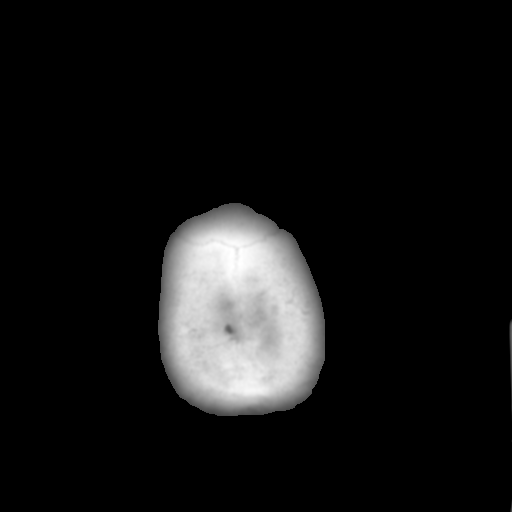

[Series 4: coronal soft · coronal · 0.32mm/px · 3 of 65 slices shown]
[im 22/65  brain]
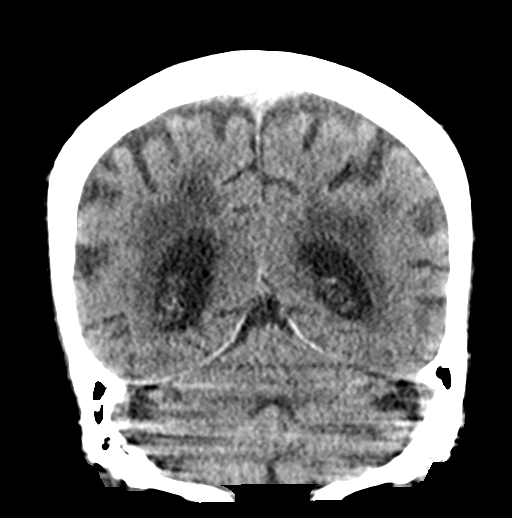
[im 29/65  brain]
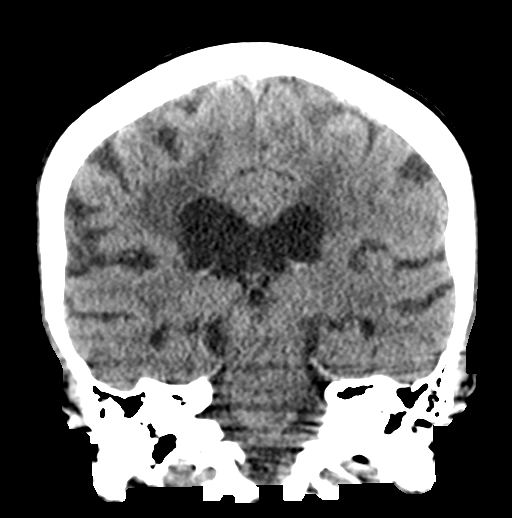
[im 36/65  brain]
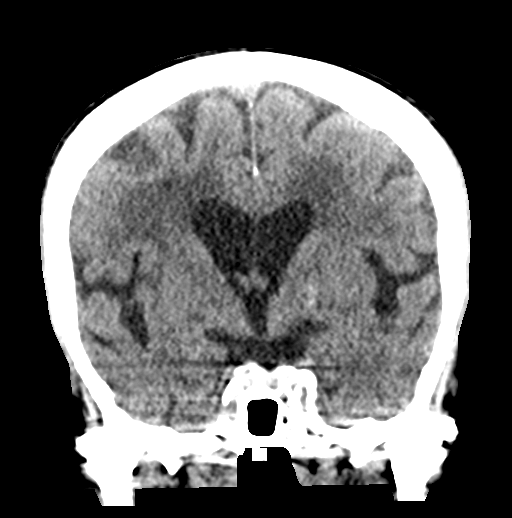

[Series 5: sagittal soft · sagittal · 0.30mm/px · 3 of 52 slices shown]
[im 18/52  brain]
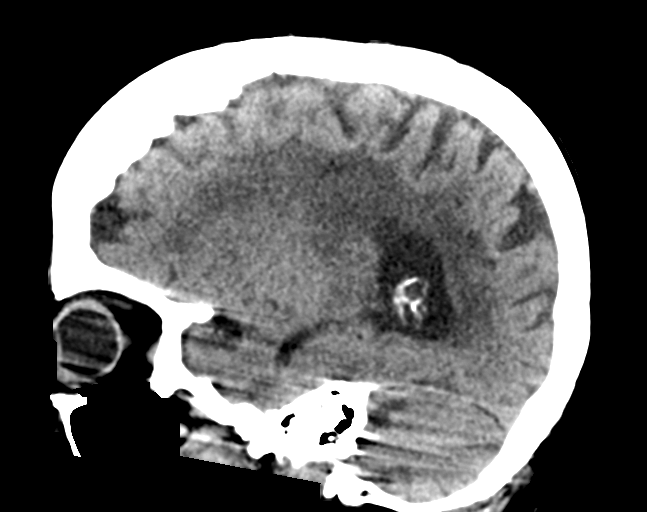
[im 26/52  brain]
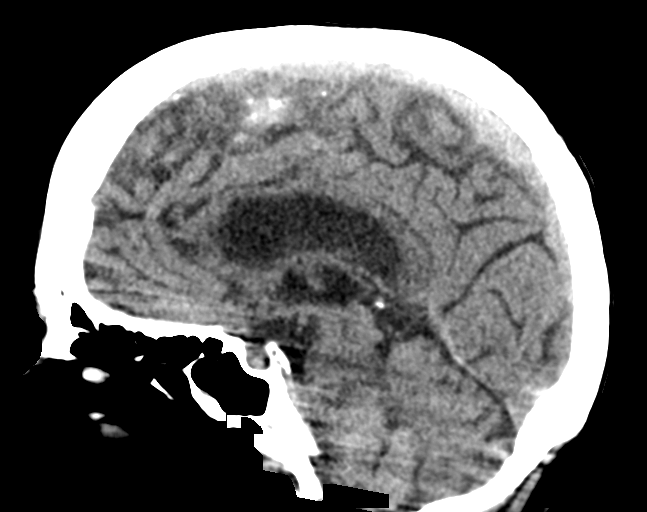
[im 35/52  brain]
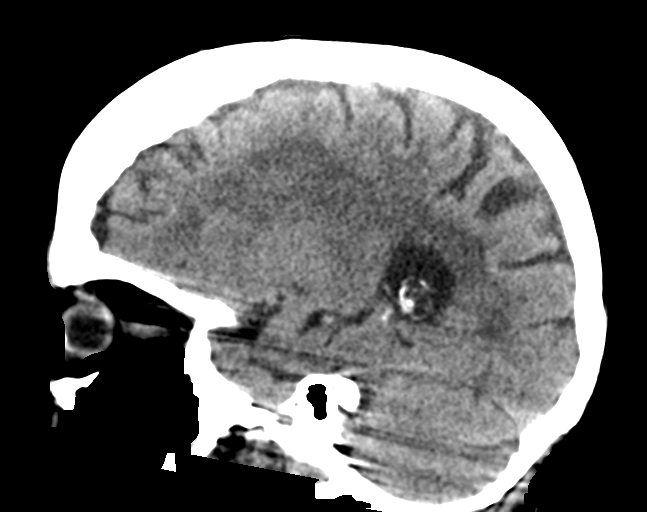

[15 of 47 positions shown; findings below may reference images not displayed]

FINDINGS: Brain: Normal anatomic configuration. Parenchymal volume loss is
commensurate with the patient's age. Extensive subcortical and
periventricular white matter changes are present likely reflecting
the sequela of small vessel ischemia. Remote lacunar infarct within
the anterior limb of the left internal capsule and within the left
subinsular white matter are again noted. No abnormal intra or
extra-axial mass lesion or fluid collection. No abnormal mass effect
or midline shift. No evidence of acute intracranial hemorrhage or
infarct. Ventricular size is normal. Cerebellum unremarkable.

Vascular: No asymmetric hyperdense vasculature at the skull base.
Extensive atherosclerotic calcification is seen within the carotid
siphons bilaterally. Punctate focus of calcification within the a
right M3 branches within the sylvian fissure is again identified and
is unchanged.

Skull: Intact

Sinuses/Orbits: Paranasal sinuses are clear. Orbits are
unremarkable.

Other: Mastoid air cells and middle ear cavities are clear.
IMPRESSION: No acute intracranial abnormality identified.

Stable advanced senescent change and remote lacunar infarcts.

## 2021-07-21 DIAGNOSIS — L603 Nail dystrophy: Secondary | ICD-10-CM | POA: Diagnosis not present

## 2021-07-21 DIAGNOSIS — B351 Tinea unguium: Secondary | ICD-10-CM | POA: Diagnosis not present

## 2021-07-21 DIAGNOSIS — I7091 Generalized atherosclerosis: Secondary | ICD-10-CM | POA: Diagnosis not present

## 2021-07-25 DIAGNOSIS — E119 Type 2 diabetes mellitus without complications: Secondary | ICD-10-CM | POA: Diagnosis not present

## 2021-07-25 DIAGNOSIS — D518 Other vitamin B12 deficiency anemias: Secondary | ICD-10-CM | POA: Diagnosis not present

## 2021-07-25 DIAGNOSIS — Z79899 Other long term (current) drug therapy: Secondary | ICD-10-CM | POA: Diagnosis not present

## 2021-07-25 DIAGNOSIS — E559 Vitamin D deficiency, unspecified: Secondary | ICD-10-CM | POA: Diagnosis not present

## 2021-07-25 DIAGNOSIS — E7849 Other hyperlipidemia: Secondary | ICD-10-CM | POA: Diagnosis not present

## 2021-07-28 DIAGNOSIS — G4700 Insomnia, unspecified: Secondary | ICD-10-CM | POA: Diagnosis not present

## 2021-07-28 DIAGNOSIS — K219 Gastro-esophageal reflux disease without esophagitis: Secondary | ICD-10-CM | POA: Diagnosis not present

## 2021-07-28 DIAGNOSIS — I495 Sick sinus syndrome: Secondary | ICD-10-CM | POA: Diagnosis not present

## 2021-07-28 DIAGNOSIS — E871 Hypo-osmolality and hyponatremia: Secondary | ICD-10-CM | POA: Diagnosis not present

## 2021-07-28 DIAGNOSIS — I480 Paroxysmal atrial fibrillation: Secondary | ICD-10-CM | POA: Diagnosis not present

## 2021-07-28 DIAGNOSIS — R1310 Dysphagia, unspecified: Secondary | ICD-10-CM | POA: Diagnosis not present

## 2021-07-28 DIAGNOSIS — K449 Diaphragmatic hernia without obstruction or gangrene: Secondary | ICD-10-CM | POA: Diagnosis not present

## 2021-07-28 DIAGNOSIS — J302 Other seasonal allergic rhinitis: Secondary | ICD-10-CM | POA: Diagnosis not present

## 2021-07-28 DIAGNOSIS — E785 Hyperlipidemia, unspecified: Secondary | ICD-10-CM | POA: Diagnosis not present

## 2021-07-29 DIAGNOSIS — I10 Essential (primary) hypertension: Secondary | ICD-10-CM | POA: Diagnosis not present

## 2021-08-05 DIAGNOSIS — I480 Paroxysmal atrial fibrillation: Secondary | ICD-10-CM | POA: Diagnosis not present

## 2021-08-05 DIAGNOSIS — D518 Other vitamin B12 deficiency anemias: Secondary | ICD-10-CM | POA: Diagnosis not present

## 2021-08-05 DIAGNOSIS — E559 Vitamin D deficiency, unspecified: Secondary | ICD-10-CM | POA: Diagnosis not present

## 2021-08-05 DIAGNOSIS — E038 Other specified hypothyroidism: Secondary | ICD-10-CM | POA: Diagnosis not present

## 2021-08-05 DIAGNOSIS — E119 Type 2 diabetes mellitus without complications: Secondary | ICD-10-CM | POA: Diagnosis not present

## 2021-08-05 DIAGNOSIS — I10 Essential (primary) hypertension: Secondary | ICD-10-CM | POA: Diagnosis not present

## 2021-08-05 DIAGNOSIS — E785 Hyperlipidemia, unspecified: Secondary | ICD-10-CM | POA: Diagnosis not present

## 2021-08-29 DIAGNOSIS — I10 Essential (primary) hypertension: Secondary | ICD-10-CM | POA: Diagnosis not present

## 2021-08-30 DIAGNOSIS — E785 Hyperlipidemia, unspecified: Secondary | ICD-10-CM | POA: Diagnosis not present

## 2021-08-30 DIAGNOSIS — D649 Anemia, unspecified: Secondary | ICD-10-CM | POA: Diagnosis not present

## 2021-08-30 DIAGNOSIS — I480 Paroxysmal atrial fibrillation: Secondary | ICD-10-CM | POA: Diagnosis not present

## 2021-08-30 DIAGNOSIS — R1310 Dysphagia, unspecified: Secondary | ICD-10-CM | POA: Diagnosis not present

## 2021-08-30 DIAGNOSIS — K219 Gastro-esophageal reflux disease without esophagitis: Secondary | ICD-10-CM | POA: Diagnosis not present

## 2021-08-30 DIAGNOSIS — G4700 Insomnia, unspecified: Secondary | ICD-10-CM | POA: Diagnosis not present

## 2021-08-30 DIAGNOSIS — J302 Other seasonal allergic rhinitis: Secondary | ICD-10-CM | POA: Diagnosis not present

## 2021-08-30 DIAGNOSIS — I495 Sick sinus syndrome: Secondary | ICD-10-CM | POA: Diagnosis not present

## 2021-09-02 DIAGNOSIS — E119 Type 2 diabetes mellitus without complications: Secondary | ICD-10-CM | POA: Diagnosis not present

## 2021-09-02 DIAGNOSIS — E785 Hyperlipidemia, unspecified: Secondary | ICD-10-CM | POA: Diagnosis not present

## 2021-09-02 DIAGNOSIS — I10 Essential (primary) hypertension: Secondary | ICD-10-CM | POA: Diagnosis not present

## 2021-09-02 DIAGNOSIS — E038 Other specified hypothyroidism: Secondary | ICD-10-CM | POA: Diagnosis not present

## 2021-09-02 DIAGNOSIS — E559 Vitamin D deficiency, unspecified: Secondary | ICD-10-CM | POA: Diagnosis not present

## 2021-09-02 DIAGNOSIS — D518 Other vitamin B12 deficiency anemias: Secondary | ICD-10-CM | POA: Diagnosis not present

## 2021-09-02 DIAGNOSIS — I480 Paroxysmal atrial fibrillation: Secondary | ICD-10-CM | POA: Diagnosis not present

## 2021-09-26 DIAGNOSIS — I10 Essential (primary) hypertension: Secondary | ICD-10-CM | POA: Diagnosis not present

## 2021-09-27 ENCOUNTER — Ambulatory Visit (INDEPENDENT_AMBULATORY_CARE_PROVIDER_SITE_OTHER): Payer: Medicare Other

## 2021-09-27 DIAGNOSIS — D649 Anemia, unspecified: Secondary | ICD-10-CM | POA: Diagnosis not present

## 2021-09-27 DIAGNOSIS — K219 Gastro-esophageal reflux disease without esophagitis: Secondary | ICD-10-CM | POA: Diagnosis not present

## 2021-09-27 DIAGNOSIS — I495 Sick sinus syndrome: Secondary | ICD-10-CM | POA: Diagnosis not present

## 2021-09-27 DIAGNOSIS — R1310 Dysphagia, unspecified: Secondary | ICD-10-CM | POA: Diagnosis not present

## 2021-09-27 DIAGNOSIS — E785 Hyperlipidemia, unspecified: Secondary | ICD-10-CM | POA: Diagnosis not present

## 2021-09-27 DIAGNOSIS — G4700 Insomnia, unspecified: Secondary | ICD-10-CM | POA: Diagnosis not present

## 2021-09-27 DIAGNOSIS — E559 Vitamin D deficiency, unspecified: Secondary | ICD-10-CM | POA: Diagnosis not present

## 2021-09-27 DIAGNOSIS — J302 Other seasonal allergic rhinitis: Secondary | ICD-10-CM | POA: Diagnosis not present

## 2021-09-27 DIAGNOSIS — R609 Edema, unspecified: Secondary | ICD-10-CM | POA: Diagnosis not present

## 2021-09-27 DIAGNOSIS — I480 Paroxysmal atrial fibrillation: Secondary | ICD-10-CM | POA: Diagnosis not present

## 2021-09-28 LAB — CUP PACEART REMOTE DEVICE CHECK
Battery Impedance: 2051 Ohm
Battery Remaining Longevity: 35 mo
Battery Voltage: 2.74 V
Brady Statistic AP VP Percent: 1 %
Brady Statistic AP VS Percent: 73 %
Brady Statistic AS VP Percent: 0 %
Brady Statistic AS VS Percent: 26 %
Date Time Interrogation Session: 20230328110143
Implantable Lead Implant Date: 20100215
Implantable Lead Implant Date: 20100215
Implantable Lead Location: 753859
Implantable Lead Location: 753860
Implantable Lead Model: 5076
Implantable Lead Model: 5076
Implantable Pulse Generator Implant Date: 20100215
Lead Channel Impedance Value: 402 Ohm
Lead Channel Impedance Value: 588 Ohm
Lead Channel Pacing Threshold Amplitude: 0.375 V
Lead Channel Pacing Threshold Amplitude: 1 V
Lead Channel Pacing Threshold Pulse Width: 0.4 ms
Lead Channel Pacing Threshold Pulse Width: 0.4 ms
Lead Channel Setting Pacing Amplitude: 2 V
Lead Channel Setting Pacing Amplitude: 2.5 V
Lead Channel Setting Pacing Pulse Width: 0.4 ms
Lead Channel Setting Sensing Sensitivity: 2 mV

## 2021-10-05 DIAGNOSIS — E559 Vitamin D deficiency, unspecified: Secondary | ICD-10-CM | POA: Diagnosis not present

## 2021-10-05 DIAGNOSIS — I480 Paroxysmal atrial fibrillation: Secondary | ICD-10-CM | POA: Diagnosis not present

## 2021-10-05 DIAGNOSIS — E785 Hyperlipidemia, unspecified: Secondary | ICD-10-CM | POA: Diagnosis not present

## 2021-10-05 DIAGNOSIS — I10 Essential (primary) hypertension: Secondary | ICD-10-CM | POA: Diagnosis not present

## 2021-10-05 DIAGNOSIS — D518 Other vitamin B12 deficiency anemias: Secondary | ICD-10-CM | POA: Diagnosis not present

## 2021-10-05 DIAGNOSIS — E038 Other specified hypothyroidism: Secondary | ICD-10-CM | POA: Diagnosis not present

## 2021-10-05 DIAGNOSIS — E119 Type 2 diabetes mellitus without complications: Secondary | ICD-10-CM | POA: Diagnosis not present

## 2021-10-10 NOTE — Progress Notes (Signed)
Remote pacemaker transmission.   

## 2021-10-12 DIAGNOSIS — B351 Tinea unguium: Secondary | ICD-10-CM | POA: Diagnosis not present

## 2021-10-12 DIAGNOSIS — I7091 Generalized atherosclerosis: Secondary | ICD-10-CM | POA: Diagnosis not present

## 2021-10-19 DIAGNOSIS — D518 Other vitamin B12 deficiency anemias: Secondary | ICD-10-CM | POA: Diagnosis not present

## 2021-10-19 DIAGNOSIS — E559 Vitamin D deficiency, unspecified: Secondary | ICD-10-CM | POA: Diagnosis not present

## 2021-10-19 DIAGNOSIS — Z79899 Other long term (current) drug therapy: Secondary | ICD-10-CM | POA: Diagnosis not present

## 2021-10-19 DIAGNOSIS — E119 Type 2 diabetes mellitus without complications: Secondary | ICD-10-CM | POA: Diagnosis not present

## 2021-10-19 DIAGNOSIS — E7849 Other hyperlipidemia: Secondary | ICD-10-CM | POA: Diagnosis not present

## 2021-10-25 DIAGNOSIS — E538 Deficiency of other specified B group vitamins: Secondary | ICD-10-CM | POA: Diagnosis not present

## 2021-10-25 DIAGNOSIS — I480 Paroxysmal atrial fibrillation: Secondary | ICD-10-CM | POA: Diagnosis not present

## 2021-10-25 DIAGNOSIS — E785 Hyperlipidemia, unspecified: Secondary | ICD-10-CM | POA: Diagnosis not present

## 2021-10-25 DIAGNOSIS — K219 Gastro-esophageal reflux disease without esophagitis: Secondary | ICD-10-CM | POA: Diagnosis not present

## 2021-10-25 DIAGNOSIS — R609 Edema, unspecified: Secondary | ICD-10-CM | POA: Diagnosis not present

## 2021-10-25 DIAGNOSIS — I495 Sick sinus syndrome: Secondary | ICD-10-CM | POA: Diagnosis not present

## 2021-10-25 DIAGNOSIS — J302 Other seasonal allergic rhinitis: Secondary | ICD-10-CM | POA: Diagnosis not present

## 2021-10-25 DIAGNOSIS — G4700 Insomnia, unspecified: Secondary | ICD-10-CM | POA: Diagnosis not present

## 2021-10-25 DIAGNOSIS — D649 Anemia, unspecified: Secondary | ICD-10-CM | POA: Diagnosis not present

## 2021-10-25 DIAGNOSIS — E559 Vitamin D deficiency, unspecified: Secondary | ICD-10-CM | POA: Diagnosis not present

## 2021-10-25 DIAGNOSIS — I69359 Hemiplegia and hemiparesis following cerebral infarction affecting unspecified side: Secondary | ICD-10-CM | POA: Diagnosis not present

## 2021-10-27 DIAGNOSIS — I10 Essential (primary) hypertension: Secondary | ICD-10-CM | POA: Diagnosis not present

## 2021-11-11 DIAGNOSIS — E119 Type 2 diabetes mellitus without complications: Secondary | ICD-10-CM | POA: Diagnosis not present

## 2021-11-11 DIAGNOSIS — E559 Vitamin D deficiency, unspecified: Secondary | ICD-10-CM | POA: Diagnosis not present

## 2021-11-11 DIAGNOSIS — E785 Hyperlipidemia, unspecified: Secondary | ICD-10-CM | POA: Diagnosis not present

## 2021-11-11 DIAGNOSIS — I480 Paroxysmal atrial fibrillation: Secondary | ICD-10-CM | POA: Diagnosis not present

## 2021-11-11 DIAGNOSIS — D518 Other vitamin B12 deficiency anemias: Secondary | ICD-10-CM | POA: Diagnosis not present

## 2021-11-11 DIAGNOSIS — E038 Other specified hypothyroidism: Secondary | ICD-10-CM | POA: Diagnosis not present

## 2021-11-11 DIAGNOSIS — I10 Essential (primary) hypertension: Secondary | ICD-10-CM | POA: Diagnosis not present

## 2021-11-22 DIAGNOSIS — I10 Essential (primary) hypertension: Secondary | ICD-10-CM | POA: Diagnosis not present

## 2021-11-22 DIAGNOSIS — J302 Other seasonal allergic rhinitis: Secondary | ICD-10-CM | POA: Diagnosis not present

## 2021-11-22 DIAGNOSIS — K219 Gastro-esophageal reflux disease without esophagitis: Secondary | ICD-10-CM | POA: Diagnosis not present

## 2021-11-22 DIAGNOSIS — E559 Vitamin D deficiency, unspecified: Secondary | ICD-10-CM | POA: Diagnosis not present

## 2021-11-22 DIAGNOSIS — E538 Deficiency of other specified B group vitamins: Secondary | ICD-10-CM | POA: Diagnosis not present

## 2021-11-22 DIAGNOSIS — G4700 Insomnia, unspecified: Secondary | ICD-10-CM | POA: Diagnosis not present

## 2021-11-22 DIAGNOSIS — I480 Paroxysmal atrial fibrillation: Secondary | ICD-10-CM | POA: Diagnosis not present

## 2021-11-22 DIAGNOSIS — R609 Edema, unspecified: Secondary | ICD-10-CM | POA: Diagnosis not present

## 2021-11-22 DIAGNOSIS — D649 Anemia, unspecified: Secondary | ICD-10-CM | POA: Diagnosis not present

## 2021-11-22 DIAGNOSIS — R1310 Dysphagia, unspecified: Secondary | ICD-10-CM | POA: Diagnosis not present

## 2021-11-22 DIAGNOSIS — E785 Hyperlipidemia, unspecified: Secondary | ICD-10-CM | POA: Diagnosis not present

## 2021-11-26 DIAGNOSIS — I10 Essential (primary) hypertension: Secondary | ICD-10-CM | POA: Diagnosis not present

## 2021-12-20 DIAGNOSIS — R609 Edema, unspecified: Secondary | ICD-10-CM | POA: Diagnosis not present

## 2021-12-20 DIAGNOSIS — I10 Essential (primary) hypertension: Secondary | ICD-10-CM | POA: Diagnosis not present

## 2021-12-20 DIAGNOSIS — E785 Hyperlipidemia, unspecified: Secondary | ICD-10-CM | POA: Diagnosis not present

## 2021-12-20 DIAGNOSIS — K219 Gastro-esophageal reflux disease without esophagitis: Secondary | ICD-10-CM | POA: Diagnosis not present

## 2021-12-20 DIAGNOSIS — E538 Deficiency of other specified B group vitamins: Secondary | ICD-10-CM | POA: Diagnosis not present

## 2021-12-20 DIAGNOSIS — E559 Vitamin D deficiency, unspecified: Secondary | ICD-10-CM | POA: Diagnosis not present

## 2021-12-20 DIAGNOSIS — I480 Paroxysmal atrial fibrillation: Secondary | ICD-10-CM | POA: Diagnosis not present

## 2021-12-26 DIAGNOSIS — I10 Essential (primary) hypertension: Secondary | ICD-10-CM | POA: Diagnosis not present

## 2021-12-26 DIAGNOSIS — E559 Vitamin D deficiency, unspecified: Secondary | ICD-10-CM | POA: Diagnosis not present

## 2021-12-26 DIAGNOSIS — E782 Mixed hyperlipidemia: Secondary | ICD-10-CM | POA: Diagnosis not present

## 2021-12-26 DIAGNOSIS — D518 Other vitamin B12 deficiency anemias: Secondary | ICD-10-CM | POA: Diagnosis not present

## 2021-12-26 DIAGNOSIS — E119 Type 2 diabetes mellitus without complications: Secondary | ICD-10-CM | POA: Diagnosis not present

## 2021-12-26 DIAGNOSIS — E038 Other specified hypothyroidism: Secondary | ICD-10-CM | POA: Diagnosis not present

## 2021-12-26 DIAGNOSIS — I480 Paroxysmal atrial fibrillation: Secondary | ICD-10-CM | POA: Diagnosis not present

## 2021-12-27 ENCOUNTER — Ambulatory Visit (INDEPENDENT_AMBULATORY_CARE_PROVIDER_SITE_OTHER): Payer: Medicare Other

## 2021-12-27 DIAGNOSIS — I495 Sick sinus syndrome: Secondary | ICD-10-CM | POA: Diagnosis not present

## 2021-12-27 LAB — CUP PACEART REMOTE DEVICE CHECK
Battery Impedance: 2056 Ohm
Battery Remaining Longevity: 36 mo
Battery Voltage: 2.73 V
Brady Statistic AP VP Percent: 1 %
Brady Statistic AP VS Percent: 75 %
Brady Statistic AS VP Percent: 0 %
Brady Statistic AS VS Percent: 24 %
Date Time Interrogation Session: 20230627102731
Implantable Lead Implant Date: 20100215
Implantable Lead Implant Date: 20100215
Implantable Lead Location: 753859
Implantable Lead Location: 753860
Implantable Lead Model: 5076
Implantable Lead Model: 5076
Implantable Pulse Generator Implant Date: 20100215
Lead Channel Impedance Value: 436 Ohm
Lead Channel Impedance Value: 653 Ohm
Lead Channel Pacing Threshold Amplitude: 0.5 V
Lead Channel Pacing Threshold Amplitude: 1.125 V
Lead Channel Pacing Threshold Pulse Width: 0.4 ms
Lead Channel Pacing Threshold Pulse Width: 0.4 ms
Lead Channel Setting Pacing Amplitude: 2 V
Lead Channel Setting Pacing Amplitude: 2.5 V
Lead Channel Setting Pacing Pulse Width: 0.4 ms
Lead Channel Setting Sensing Sensitivity: 2.8 mV

## 2021-12-28 DIAGNOSIS — I10 Essential (primary) hypertension: Secondary | ICD-10-CM | POA: Diagnosis not present

## 2022-01-03 DIAGNOSIS — R296 Repeated falls: Secondary | ICD-10-CM | POA: Diagnosis not present

## 2022-01-13 DIAGNOSIS — E038 Other specified hypothyroidism: Secondary | ICD-10-CM | POA: Diagnosis not present

## 2022-01-13 DIAGNOSIS — I10 Essential (primary) hypertension: Secondary | ICD-10-CM | POA: Diagnosis not present

## 2022-01-13 DIAGNOSIS — E559 Vitamin D deficiency, unspecified: Secondary | ICD-10-CM | POA: Diagnosis not present

## 2022-01-13 DIAGNOSIS — D518 Other vitamin B12 deficiency anemias: Secondary | ICD-10-CM | POA: Diagnosis not present

## 2022-01-13 DIAGNOSIS — E119 Type 2 diabetes mellitus without complications: Secondary | ICD-10-CM | POA: Diagnosis not present

## 2022-01-13 DIAGNOSIS — E785 Hyperlipidemia, unspecified: Secondary | ICD-10-CM | POA: Diagnosis not present

## 2022-01-17 DIAGNOSIS — I10 Essential (primary) hypertension: Secondary | ICD-10-CM | POA: Diagnosis not present

## 2022-01-17 DIAGNOSIS — J302 Other seasonal allergic rhinitis: Secondary | ICD-10-CM | POA: Diagnosis not present

## 2022-01-17 DIAGNOSIS — R1310 Dysphagia, unspecified: Secondary | ICD-10-CM | POA: Diagnosis not present

## 2022-01-17 DIAGNOSIS — I480 Paroxysmal atrial fibrillation: Secondary | ICD-10-CM | POA: Diagnosis not present

## 2022-01-17 DIAGNOSIS — G4700 Insomnia, unspecified: Secondary | ICD-10-CM | POA: Diagnosis not present

## 2022-01-17 DIAGNOSIS — E785 Hyperlipidemia, unspecified: Secondary | ICD-10-CM | POA: Diagnosis not present

## 2022-01-17 NOTE — Progress Notes (Signed)
Remote pacemaker transmission.   

## 2022-01-27 DIAGNOSIS — R011 Cardiac murmur, unspecified: Secondary | ICD-10-CM | POA: Diagnosis not present

## 2022-01-28 DIAGNOSIS — I10 Essential (primary) hypertension: Secondary | ICD-10-CM | POA: Diagnosis not present

## 2022-02-07 DIAGNOSIS — R0989 Other specified symptoms and signs involving the circulatory and respiratory systems: Secondary | ICD-10-CM | POA: Diagnosis not present

## 2022-02-07 DIAGNOSIS — I6523 Occlusion and stenosis of bilateral carotid arteries: Secondary | ICD-10-CM | POA: Diagnosis not present

## 2022-02-08 DIAGNOSIS — E559 Vitamin D deficiency, unspecified: Secondary | ICD-10-CM | POA: Diagnosis not present

## 2022-02-08 DIAGNOSIS — D518 Other vitamin B12 deficiency anemias: Secondary | ICD-10-CM | POA: Diagnosis not present

## 2022-02-08 DIAGNOSIS — E038 Other specified hypothyroidism: Secondary | ICD-10-CM | POA: Diagnosis not present

## 2022-02-08 DIAGNOSIS — E119 Type 2 diabetes mellitus without complications: Secondary | ICD-10-CM | POA: Diagnosis not present

## 2022-02-08 DIAGNOSIS — E782 Mixed hyperlipidemia: Secondary | ICD-10-CM | POA: Diagnosis not present

## 2022-02-08 DIAGNOSIS — I10 Essential (primary) hypertension: Secondary | ICD-10-CM | POA: Diagnosis not present

## 2022-02-20 DIAGNOSIS — N390 Urinary tract infection, site not specified: Secondary | ICD-10-CM | POA: Diagnosis not present

## 2022-02-21 DIAGNOSIS — I480 Paroxysmal atrial fibrillation: Secondary | ICD-10-CM | POA: Diagnosis not present

## 2022-02-21 DIAGNOSIS — I10 Essential (primary) hypertension: Secondary | ICD-10-CM | POA: Diagnosis not present

## 2022-02-21 DIAGNOSIS — T50905A Adverse effect of unspecified drugs, medicaments and biological substances, initial encounter: Secondary | ICD-10-CM | POA: Diagnosis not present

## 2022-02-21 DIAGNOSIS — E785 Hyperlipidemia, unspecified: Secondary | ICD-10-CM | POA: Diagnosis not present

## 2022-02-21 DIAGNOSIS — N390 Urinary tract infection, site not specified: Secondary | ICD-10-CM | POA: Diagnosis not present

## 2022-02-21 DIAGNOSIS — R1310 Dysphagia, unspecified: Secondary | ICD-10-CM | POA: Diagnosis not present

## 2022-02-21 DIAGNOSIS — J302 Other seasonal allergic rhinitis: Secondary | ICD-10-CM | POA: Diagnosis not present

## 2022-02-24 DIAGNOSIS — I77811 Abdominal aortic ectasia: Secondary | ICD-10-CM | POA: Diagnosis not present

## 2022-02-28 DIAGNOSIS — I10 Essential (primary) hypertension: Secondary | ICD-10-CM | POA: Diagnosis not present

## 2022-03-01 DIAGNOSIS — I70223 Atherosclerosis of native arteries of extremities with rest pain, bilateral legs: Secondary | ICD-10-CM | POA: Diagnosis not present

## 2022-03-09 DIAGNOSIS — E119 Type 2 diabetes mellitus without complications: Secondary | ICD-10-CM | POA: Diagnosis not present

## 2022-03-09 DIAGNOSIS — I10 Essential (primary) hypertension: Secondary | ICD-10-CM | POA: Diagnosis not present

## 2022-03-09 DIAGNOSIS — E559 Vitamin D deficiency, unspecified: Secondary | ICD-10-CM | POA: Diagnosis not present

## 2022-03-09 DIAGNOSIS — E782 Mixed hyperlipidemia: Secondary | ICD-10-CM | POA: Diagnosis not present

## 2022-03-09 DIAGNOSIS — E038 Other specified hypothyroidism: Secondary | ICD-10-CM | POA: Diagnosis not present

## 2022-03-09 DIAGNOSIS — D518 Other vitamin B12 deficiency anemias: Secondary | ICD-10-CM | POA: Diagnosis not present

## 2022-03-09 DIAGNOSIS — I480 Paroxysmal atrial fibrillation: Secondary | ICD-10-CM | POA: Diagnosis not present

## 2022-03-12 DIAGNOSIS — R296 Repeated falls: Secondary | ICD-10-CM | POA: Diagnosis not present

## 2022-03-13 DIAGNOSIS — E119 Type 2 diabetes mellitus without complications: Secondary | ICD-10-CM | POA: Diagnosis not present

## 2022-03-13 DIAGNOSIS — Z79899 Other long term (current) drug therapy: Secondary | ICD-10-CM | POA: Diagnosis not present

## 2022-03-13 DIAGNOSIS — E559 Vitamin D deficiency, unspecified: Secondary | ICD-10-CM | POA: Diagnosis not present

## 2022-03-13 DIAGNOSIS — E038 Other specified hypothyroidism: Secondary | ICD-10-CM | POA: Diagnosis not present

## 2022-03-13 DIAGNOSIS — D518 Other vitamin B12 deficiency anemias: Secondary | ICD-10-CM | POA: Diagnosis not present

## 2022-03-13 DIAGNOSIS — E782 Mixed hyperlipidemia: Secondary | ICD-10-CM | POA: Diagnosis not present

## 2022-03-15 DIAGNOSIS — N390 Urinary tract infection, site not specified: Secondary | ICD-10-CM | POA: Diagnosis not present

## 2022-03-17 DIAGNOSIS — N390 Urinary tract infection, site not specified: Secondary | ICD-10-CM | POA: Diagnosis not present

## 2022-03-25 ENCOUNTER — Inpatient Hospital Stay (HOSPITAL_COMMUNITY)
Admission: EM | Admit: 2022-03-25 | Discharge: 2022-04-03 | DRG: 368 | Disposition: A | Discharge status: Skilled Nursing Facility | Payer: Medicare Other | Source: Skilled Nursing Facility | Attending: Internal Medicine | Admitting: Internal Medicine

## 2022-03-25 ENCOUNTER — Encounter (HOSPITAL_COMMUNITY): Payer: Self-pay

## 2022-03-25 ENCOUNTER — Encounter (HOSPITAL_COMMUNITY)
Admission: EM | Disposition: A | Discharge status: Skilled Nursing Facility | Payer: Self-pay | Source: Skilled Nursing Facility | Attending: Internal Medicine

## 2022-03-25 ENCOUNTER — Inpatient Hospital Stay (HOSPITAL_COMMUNITY): Payer: Medicare Other | Admitting: Anesthesiology

## 2022-03-25 ENCOUNTER — Other Ambulatory Visit: Payer: Self-pay

## 2022-03-25 ENCOUNTER — Emergency Department (HOSPITAL_COMMUNITY): Payer: Medicare Other

## 2022-03-25 DIAGNOSIS — Z8249 Family history of ischemic heart disease and other diseases of the circulatory system: Secondary | ICD-10-CM

## 2022-03-25 DIAGNOSIS — K219 Gastro-esophageal reflux disease without esophagitis: Secondary | ICD-10-CM | POA: Diagnosis not present

## 2022-03-25 DIAGNOSIS — K208 Other esophagitis without bleeding: Secondary | ICD-10-CM | POA: Diagnosis not present

## 2022-03-25 DIAGNOSIS — I10 Essential (primary) hypertension: Secondary | ICD-10-CM | POA: Diagnosis not present

## 2022-03-25 DIAGNOSIS — E876 Hypokalemia: Secondary | ICD-10-CM | POA: Diagnosis present

## 2022-03-25 DIAGNOSIS — Z950 Presence of cardiac pacemaker: Secondary | ICD-10-CM | POA: Diagnosis not present

## 2022-03-25 DIAGNOSIS — R0689 Other abnormalities of breathing: Secondary | ICD-10-CM | POA: Diagnosis not present

## 2022-03-25 DIAGNOSIS — R58 Hemorrhage, not elsewhere classified: Secondary | ICD-10-CM | POA: Diagnosis not present

## 2022-03-25 DIAGNOSIS — Z66 Do not resuscitate: Secondary | ICD-10-CM | POA: Diagnosis not present

## 2022-03-25 DIAGNOSIS — I69354 Hemiplegia and hemiparesis following cerebral infarction affecting left non-dominant side: Secondary | ICD-10-CM

## 2022-03-25 DIAGNOSIS — I480 Paroxysmal atrial fibrillation: Secondary | ICD-10-CM | POA: Diagnosis present

## 2022-03-25 DIAGNOSIS — K225 Diverticulum of esophagus, acquired: Secondary | ICD-10-CM | POA: Diagnosis present

## 2022-03-25 DIAGNOSIS — R1310 Dysphagia, unspecified: Secondary | ICD-10-CM

## 2022-03-25 DIAGNOSIS — K226 Gastro-esophageal laceration-hemorrhage syndrome: Principal | ICD-10-CM | POA: Diagnosis present

## 2022-03-25 DIAGNOSIS — K2210 Ulcer of esophagus without bleeding: Secondary | ICD-10-CM | POA: Diagnosis not present

## 2022-03-25 DIAGNOSIS — F32A Depression, unspecified: Secondary | ICD-10-CM | POA: Diagnosis not present

## 2022-03-25 DIAGNOSIS — J90 Pleural effusion, not elsewhere classified: Secondary | ICD-10-CM | POA: Diagnosis not present

## 2022-03-25 DIAGNOSIS — K449 Diaphragmatic hernia without obstruction or gangrene: Secondary | ICD-10-CM | POA: Diagnosis present

## 2022-03-25 DIAGNOSIS — E785 Hyperlipidemia, unspecified: Secondary | ICD-10-CM | POA: Diagnosis present

## 2022-03-25 DIAGNOSIS — Z7901 Long term (current) use of anticoagulants: Secondary | ICD-10-CM

## 2022-03-25 DIAGNOSIS — D649 Anemia, unspecified: Secondary | ICD-10-CM | POA: Diagnosis not present

## 2022-03-25 DIAGNOSIS — F03918 Unspecified dementia, unspecified severity, with other behavioral disturbance: Secondary | ICD-10-CM | POA: Diagnosis not present

## 2022-03-25 DIAGNOSIS — R6889 Other general symptoms and signs: Secondary | ICD-10-CM | POA: Diagnosis not present

## 2022-03-25 DIAGNOSIS — Z880 Allergy status to penicillin: Secondary | ICD-10-CM

## 2022-03-25 DIAGNOSIS — J9601 Acute respiratory failure with hypoxia: Secondary | ICD-10-CM | POA: Diagnosis present

## 2022-03-25 DIAGNOSIS — M6281 Muscle weakness (generalized): Secondary | ICD-10-CM | POA: Diagnosis present

## 2022-03-25 DIAGNOSIS — K921 Melena: Secondary | ICD-10-CM

## 2022-03-25 DIAGNOSIS — K922 Gastrointestinal hemorrhage, unspecified: Secondary | ICD-10-CM | POA: Diagnosis not present

## 2022-03-25 DIAGNOSIS — R112 Nausea with vomiting, unspecified: Secondary | ICD-10-CM | POA: Diagnosis not present

## 2022-03-25 DIAGNOSIS — K920 Hematemesis: Secondary | ICD-10-CM

## 2022-03-25 DIAGNOSIS — Z87891 Personal history of nicotine dependence: Secondary | ICD-10-CM | POA: Diagnosis not present

## 2022-03-25 DIAGNOSIS — Z743 Need for continuous supervision: Secondary | ICD-10-CM | POA: Diagnosis not present

## 2022-03-25 DIAGNOSIS — I69391 Dysphagia following cerebral infarction: Secondary | ICD-10-CM

## 2022-03-25 DIAGNOSIS — R1314 Dysphagia, pharyngoesophageal phase: Secondary | ICD-10-CM | POA: Diagnosis present

## 2022-03-25 DIAGNOSIS — R52 Pain, unspecified: Secondary | ICD-10-CM | POA: Diagnosis not present

## 2022-03-25 DIAGNOSIS — I639 Cerebral infarction, unspecified: Secondary | ICD-10-CM | POA: Diagnosis present

## 2022-03-25 DIAGNOSIS — K222 Esophageal obstruction: Secondary | ICD-10-CM | POA: Diagnosis not present

## 2022-03-25 DIAGNOSIS — E7800 Pure hypercholesterolemia, unspecified: Secondary | ICD-10-CM | POA: Diagnosis not present

## 2022-03-25 DIAGNOSIS — R2689 Other abnormalities of gait and mobility: Secondary | ICD-10-CM | POA: Diagnosis not present

## 2022-03-25 DIAGNOSIS — Z7401 Bed confinement status: Secondary | ICD-10-CM | POA: Diagnosis not present

## 2022-03-25 DIAGNOSIS — N390 Urinary tract infection, site not specified: Secondary | ICD-10-CM | POA: Diagnosis present

## 2022-03-25 DIAGNOSIS — Z681 Body mass index (BMI) 19 or less, adult: Secondary | ICD-10-CM

## 2022-03-25 DIAGNOSIS — D62 Acute posthemorrhagic anemia: Secondary | ICD-10-CM | POA: Diagnosis not present

## 2022-03-25 DIAGNOSIS — I499 Cardiac arrhythmia, unspecified: Secondary | ICD-10-CM | POA: Diagnosis not present

## 2022-03-25 DIAGNOSIS — M1990 Unspecified osteoarthritis, unspecified site: Secondary | ICD-10-CM | POA: Diagnosis not present

## 2022-03-25 DIAGNOSIS — E46 Unspecified protein-calorie malnutrition: Secondary | ICD-10-CM | POA: Diagnosis not present

## 2022-03-25 DIAGNOSIS — J9690 Respiratory failure, unspecified, unspecified whether with hypoxia or hypercapnia: Secondary | ICD-10-CM | POA: Diagnosis not present

## 2022-03-25 DIAGNOSIS — K2100 Gastro-esophageal reflux disease with esophagitis, without bleeding: Secondary | ICD-10-CM | POA: Diagnosis not present

## 2022-03-25 DIAGNOSIS — Z79899 Other long term (current) drug therapy: Secondary | ICD-10-CM

## 2022-03-25 DIAGNOSIS — Z887 Allergy status to serum and vaccine status: Secondary | ICD-10-CM

## 2022-03-25 HISTORY — PX: ESOPHAGOGASTRODUODENOSCOPY (EGD) WITH PROPOFOL: SHX5813

## 2022-03-25 LAB — TYPE AND SCREEN
ABO/RH(D): A NEG
Antibody Screen: NEGATIVE

## 2022-03-25 LAB — GLUCOSE, CAPILLARY
Glucose-Capillary: 132 mg/dL — ABNORMAL HIGH (ref 70–99)
Glucose-Capillary: 150 mg/dL — ABNORMAL HIGH (ref 70–99)

## 2022-03-25 LAB — BLOOD GAS, ARTERIAL
Acid-Base Excess: 3.1 mmol/L — ABNORMAL HIGH (ref 0.0–2.0)
Bicarbonate: 25.1 mmol/L (ref 20.0–28.0)
Drawn by: 22223
FIO2: 50 %
O2 Saturation: 100 %
Patient temperature: 37
pCO2 arterial: 30 mmHg — ABNORMAL LOW (ref 32–48)
pH, Arterial: 7.53 — ABNORMAL HIGH (ref 7.35–7.45)
pO2, Arterial: 146 mmHg — ABNORMAL HIGH (ref 83–108)

## 2022-03-25 LAB — COMPREHENSIVE METABOLIC PANEL
ALT: 20 U/L (ref 0–44)
AST: 20 U/L (ref 15–41)
Albumin: 3.1 g/dL — ABNORMAL LOW (ref 3.5–5.0)
Alkaline Phosphatase: 57 U/L (ref 38–126)
Anion gap: 6 (ref 5–15)
BUN: 34 mg/dL — ABNORMAL HIGH (ref 8–23)
CO2: 28 mmol/L (ref 22–32)
Calcium: 9.3 mg/dL (ref 8.9–10.3)
Chloride: 105 mmol/L (ref 98–111)
Creatinine, Ser: 0.91 mg/dL (ref 0.44–1.00)
GFR, Estimated: 57 mL/min — ABNORMAL LOW (ref >60)
Glucose, Bld: 131 mg/dL — ABNORMAL HIGH (ref 70–99)
Potassium: 4.1 mmol/L (ref 3.5–5.1)
Sodium: 139 mmol/L (ref 135–145)
Total Bilirubin: 0.6 mg/dL (ref 0.3–1.2)
Total Protein: 6.3 g/dL — ABNORMAL LOW (ref 6.5–8.1)

## 2022-03-25 LAB — CBC
HCT: 28.7 % — ABNORMAL LOW (ref 36.0–46.0)
Hemoglobin: 9.4 g/dL — ABNORMAL LOW (ref 12.0–15.0)
MCH: 30.6 pg (ref 26.0–34.0)
MCHC: 32.8 g/dL (ref 30.0–36.0)
MCV: 93.5 fL (ref 80.0–100.0)
Platelets: 260 10*3/uL (ref 150–400)
RBC: 3.07 MIL/uL — ABNORMAL LOW (ref 3.87–5.11)
RDW: 14.6 % (ref 11.5–15.5)
WBC: 5.6 10*3/uL (ref 4.0–10.5)
nRBC: 0 % (ref 0.0–0.2)

## 2022-03-25 LAB — HEMOGLOBIN AND HEMATOCRIT, BLOOD
HCT: 23.7 % — ABNORMAL LOW (ref 36.0–46.0)
Hemoglobin: 7.9 g/dL — ABNORMAL LOW (ref 12.0–15.0)

## 2022-03-25 LAB — PROTIME-INR
INR: 1.3 — ABNORMAL HIGH (ref 0.8–1.2)
Prothrombin Time: 16.4 seconds — ABNORMAL HIGH (ref 11.4–15.2)

## 2022-03-25 LAB — ABO/RH: ABO/RH(D): A NEG

## 2022-03-25 SURGERY — ESOPHAGOGASTRODUODENOSCOPY (EGD) WITH PROPOFOL
Anesthesia: General

## 2022-03-25 MED ORDER — PROPOFOL 1000 MG/100ML IV EMUL
INTRAVENOUS | Status: AC
  Filled 2022-03-25: qty 100

## 2022-03-25 MED ORDER — PROPOFOL 10 MG/ML IV BOLUS
INTRAVENOUS | Status: DC | PRN
  Administered 2022-03-25 (×6): 20 mg via INTRAVENOUS

## 2022-03-25 MED ORDER — PANTOPRAZOLE INFUSION (NEW) - SIMPLE MED
8.0000 mg/h | INTRAVENOUS | Status: AC
  Administered 2022-03-25 – 2022-03-28 (×7): 8 mg/h via INTRAVENOUS
  Filled 2022-03-25: qty 100
  Filled 2022-03-25 (×2): qty 80
  Filled 2022-03-25 (×2): qty 100
  Filled 2022-03-25: qty 80
  Filled 2022-03-25 (×2): qty 100

## 2022-03-25 MED ORDER — ROCURONIUM BROMIDE 100 MG/10ML IV SOLN
INTRAVENOUS | Status: DC | PRN
  Administered 2022-03-25: 30 mg via INTRAVENOUS
  Administered 2022-03-25: 70 mg via INTRAVENOUS

## 2022-03-25 MED ORDER — LACTATED RINGERS IV SOLN: INTRAVENOUS | Status: DC

## 2022-03-25 MED ORDER — ONDANSETRON HCL 4 MG/2ML IJ SOLN
4.0000 mg | Freq: Once | INTRAMUSCULAR | Status: AC
  Administered 2022-03-25: 4 mg via INTRAVENOUS
  Filled 2022-03-25: qty 2

## 2022-03-25 MED ORDER — DEXTROSE IN LACTATED RINGERS 5 % IV SOLN: INTRAVENOUS | Status: DC

## 2022-03-25 MED ORDER — ROCURONIUM BROMIDE 10 MG/ML (PF) SYRINGE
PREFILLED_SYRINGE | INTRAVENOUS | Status: AC
  Filled 2022-03-25: qty 10

## 2022-03-25 MED ORDER — SODIUM CHLORIDE 0.9 % IV BOLUS
1000.0000 mL | Freq: Once | INTRAVENOUS | Status: AC
  Administered 2022-03-25: 1000 mL via INTRAVENOUS

## 2022-03-25 MED ORDER — PROPOFOL 10 MG/ML IV BOLUS
INTRAVENOUS | Status: AC
  Filled 2022-03-25: qty 20

## 2022-03-25 MED ORDER — CHLORHEXIDINE GLUCONATE CLOTH 2 % EX PADS
6.0000 | MEDICATED_PAD | Freq: Every day | CUTANEOUS | Status: DC
  Administered 2022-03-26 – 2022-03-31 (×8): 6 via TOPICAL

## 2022-03-25 MED ORDER — PANTOPRAZOLE 80MG IVPB - SIMPLE MED
80.0000 mg | Freq: Once | INTRAVENOUS | Status: AC
  Administered 2022-03-25: 80 mg via INTRAVENOUS
  Filled 2022-03-25: qty 100

## 2022-03-25 MED ORDER — PHENYLEPHRINE HCL (PRESSORS) 10 MG/ML IV SOLN
INTRAVENOUS | Status: DC | PRN
  Administered 2022-03-25 (×2): 160 ug via INTRAVENOUS

## 2022-03-25 MED ORDER — ACETAMINOPHEN 325 MG PO TABS
650.0000 mg | ORAL_TABLET | Freq: Four times a day (QID) | ORAL | Status: DC | PRN
  Administered 2022-03-30 – 2022-04-02 (×3): 650 mg via ORAL
  Filled 2022-03-25 (×3): qty 2

## 2022-03-25 MED ORDER — POLYETHYLENE GLYCOL 3350 17 G PO PACK: 17.0000 g | PACK | Freq: Every day | ORAL | Status: DC

## 2022-03-25 MED ORDER — PROPOFOL 1000 MG/100ML IV EMUL
0.0000 ug/kg/min | INTRAVENOUS | Status: DC
  Administered 2022-03-25: 5 ug/kg/min via INTRAVENOUS
  Administered 2022-03-26 – 2022-03-27 (×4): 40 ug/kg/min via INTRAVENOUS
  Administered 2022-03-27 (×2): 45 ug/kg/min via INTRAVENOUS
  Administered 2022-03-27: 40 ug/kg/min via INTRAVENOUS
  Administered 2022-03-28: 5 ug/kg/min via INTRAVENOUS
  Filled 2022-03-25 (×8): qty 100

## 2022-03-25 MED ORDER — PROTHROMBIN COMPLEX CONC HUMAN 500 UNITS IV KIT
2728.0000 [IU] | PACK | Status: AC
  Administered 2022-03-25: 2728 [IU] via INTRAVENOUS
  Filled 2022-03-25: qty 2728

## 2022-03-25 MED ORDER — DOCUSATE SODIUM 50 MG/5ML PO LIQD: 100.0000 mg | Freq: Two times a day (BID) | ORAL | Status: DC

## 2022-03-25 MED ORDER — ONDANSETRON HCL 4 MG PO TABS: 4.0000 mg | ORAL_TABLET | Freq: Four times a day (QID) | ORAL | Status: DC | PRN

## 2022-03-25 MED ORDER — LACTATED RINGERS IV SOLN: INTRAVENOUS | Status: DC | PRN

## 2022-03-25 MED ORDER — SODIUM CHLORIDE 0.9 % IV SOLN: INTRAVENOUS | Status: DC

## 2022-03-25 MED ORDER — ONDANSETRON HCL 4 MG/2ML IJ SOLN: 4.0000 mg | Freq: Four times a day (QID) | INTRAMUSCULAR | Status: DC | PRN

## 2022-03-25 MED ORDER — ACETAMINOPHEN 650 MG RE SUPP: 650.0000 mg | Freq: Four times a day (QID) | RECTAL | Status: DC | PRN

## 2022-03-25 NOTE — H&P (Signed)
History and Physical    Kelsey Thompson YBW:[id number] DOB: 1921/02/16 DOA: 03/25/2022  PCP: Claretta Fraise, MD   Patient coming from: ALF  Chief Complaint: Hematemesis  HPI: Kelsey Thompson is a 88 y.o. female with medical history significant for paroxysmal atrial fibrillation on anticoagulation with Eliquis, pacemaker placement, prior CVA, hypertension, dyslipidemia, GERD, and dementia who was brought to the ED for evaluation of hematemesis that began earlier today.  She was noted to have some blood on her pants as well as in the trash can this morning.  She denies any significant abdominal pain, but continues to have some retching.  No fevers, chills, or diarrhea noted.  Patient is on a liquid diet at her facility and has had some weight loss recently.  Last dose of Eliquis noted to be this morning.   ED Course: Vital signs stable and patient is afebrile.  Chest x-ray with no acute findings.  Hemoglobin of 9.4 and INR 1.3.  She has been started on some IV fluid and Protonix infusion.  GI consulted for potential endoscopy.  Review of Systems: Reviewed as noted above, otherwise negative.  Past Medical History:  Diagnosis Date   Arthritis    Bradycardia    GERD (gastroesophageal reflux disease)    Hiatal hernia    Hypercholesterolemia    Hypertension    Pacemaker    Stroke [facility])     Past Surgical History:  Procedure Laterality Date   APPENDECTOMY     CESAREAN SECTION     PACEMAKER INSERTION     TONSILLECTOMY       reports that she quit smoking about 40 years ago. Her smoking use included cigarettes. She has never used smokeless tobacco. She reports that she does not drink alcohol and does not use drugs.  Allergies  Allergen Reactions   Horse-Derived Products Other (See Comments)    Tetanus:Reaction unknown   Penicillins Other (See Comments)    Did it involve swelling of the face/tongue/throat, SOB, or low BP? Yes-swelling Did it involve sudden or severe rash/hives, skin  peeling, or any reaction on the inside of your mouth or nose? No Did you need to seek medical attention at a hospital or doctor's office? Yes-Dr. office When did it last happen?  4 years prior     If all above answers are "NO", may proceed with cephalosporin use.    Tetanus Antitoxin     Unknown reaction    Family History  Problem Relation Age of Onset   Heart failure Mother    Diabetes Father    Heart attack Father     Prior to Admission medications   Medication Sig Start Date End Date Taking? Authorizing Provider  acetaminophen (TYLENOL) 500 MG tablet Take 500 mg by mouth every 6 (six) hours as needed for mild pain or moderate pain.    [provider]  amLODipine (NORVASC) 5 MG tablet Take 1 tablet (5 mg total) by mouth daily. 04/07/19   Claretta Fraise, MD  apixaban (ELIQUIS) 2.5 MG TABS tablet Take 1 tablet (2.5 mg total) by mouth 2 (two) times daily. 04/07/19   Claretta Fraise, MD  calcium-vitamin D (OSCAL WITH D) 500-200 MG-UNIT per tablet Take 1 tablet by mouth daily.    [provider]  ciprofloxacin (CIPRO) 500 MG tablet Take 1 tablet (500 mg total) by mouth 2 (two) times daily. One po bid x 7 days 10/13/20   Orpah Greek, MD  clindamycin (CLEOCIN) 150 MG capsule Take 150 mg  by mouth every 6 (six) hours. Patient not taking: Reported on 10/12/2020 08/17/20   [provider]  donepezil (ARICEPT) 5 MG tablet Take 1 tablet (5 mg total) by mouth at bedtime. 05/19/19   Mechele Claude, MD  enalapril (VASOTEC) 5 MG tablet Take 1 tablet (5 mg total) by mouth daily. 04/07/19   Mechele Claude, MD  ferrous sulfate 325 (65 FE) MG tablet Take 1 tablet (325 mg total) by mouth daily with breakfast. 04/07/19   Mechele Claude, MD  omeprazole (PRILOSEC) 20 MG capsule Take 1 capsule (20 mg total) by mouth daily. 04/07/19   Mechele Claude, MD  pravastatin (PRAVACHOL) 20 MG tablet Take 1 tablet (20 mg total) by mouth daily at 6 PM. 04/07/19   Mechele Claude, MD  Propylene  Glycol (SYSTANE COMPLETE OP) Apply 1 drop to eye 4 (four) times daily.    [provider]  QUEtiapine (SEROQUEL) 25 MG tablet Take 1 tablet (25 mg total) by mouth at bedtime. Patient not taking: Reported on 10/17/2020 05/19/19   Mechele Claude, MD  triamcinolone cream (KENALOG) 0.1 % Apply 1 application topically 3 (three) times daily. Avoid face and genitalia Patient not taking: No sig reported 05/19/19   Mechele Claude, MD  vitamin C (ASCORBIC ACID) 500 MG tablet Take 500 mg by mouth daily.    [provider]  zinc sulfate 220 (50 Zn) MG capsule Take 1 capsule (220 mg total) by mouth daily. 04/07/19   Mechele Claude, MD    Physical Exam: Vitals:   03/25/22 0938 03/25/22 1030 03/25/22 1130  BP: (!) 143/66 (!) 120/56 127/69  Pulse: 78 80 60  Resp: 14 19 14   Temp: 97.9 F (36.6 C)    TempSrc: Oral    SpO2: 96% 96% 100%    Constitutional: NAD, calm, comfortable, frail/elderly Vitals:   03/25/22 0938 03/25/22 1030 03/25/22 1130  BP: (!) 143/66 (!) 120/56 127/69  Pulse: 78 80 60  Resp: 14 19 14   Temp: 97.9 F (36.6 C)    TempSrc: Oral    SpO2: 96% 96% 100%   Eyes: lids and conjunctivae normal Neck: normal, supple Respiratory: clear to auscultation bilaterally. Normal respiratory effort. No accessory muscle use.  Cardiovascular: Regular rate and rhythm, no murmurs. Abdomen: no tenderness, no distention. Bowel sounds positive.  Musculoskeletal:  No edema. Skin: no rashes, lesions, ulcers.  Psychiatric: Flat affect  Labs on Admission: I have personally reviewed following labs and imaging studies  CBC: Recent Labs  Lab 03/25/22 1005  WBC 5.6  HGB 9.4*  HCT 28.7*  MCV 93.5  PLT 260   Basic Metabolic Panel: Recent Labs  Lab 03/25/22 1005  NA 139  K 4.1  CL 105  CO2 28  GLUCOSE 131*  BUN 34*  CREATININE 0.91  CALCIUM 9.3   GFR: CrCl cannot be calculated (Unknown ideal weight.). Liver Function Tests: Recent Labs  Lab 03/25/22 1005  AST 20   ALT 20  ALKPHOS 57  BILITOT 0.6  PROT 6.3*  ALBUMIN 3.1*   No results for input(s): "LIPASE", "AMYLASE" in the last 168 hours. No results for input(s): "AMMONIA" in the last 168 hours. Coagulation Profile: Recent Labs  Lab 03/25/22 1005  INR 1.3*   Cardiac Enzymes: No results for input(s): "CKTOTAL", "CKMB", "CKMBINDEX", "TROPONINI" in the last 168 hours. BNP (last 3 results) No results for input(s): "PROBNP" in the last 8760 hours. HbA1C: No results for input(s): "HGBA1C" in the last 72 hours. CBG: No results for input(s): "GLUCAP" in the  last 168 hours. Lipid Profile: No results for input(s): "CHOL", "HDL", "LDLCALC", "TRIG", "CHOLHDL", "LDLDIRECT" in the last 72 hours. Thyroid Function Tests: No results for input(s): "TSH", "T4TOTAL", "FREET4", "T3FREE", "THYROIDAB" in the last 72 hours. Anemia Panel: No results for input(s): "VITAMINB12", "FOLATE", "FERRITIN", "TIBC", "IRON", "RETICCTPCT" in the last 72 hours. Urine analysis:    Component Value Date/Time   COLORURINE YELLOW 10/20/2020 1219   APPEARANCEUR CLEAR 10/20/2020 1219   APPEARANCEUR Clear 10/11/2020 1407   LABSPEC 1.024 10/20/2020 1219   PHURINE 5.0 10/20/2020 1219   GLUCOSEU NEGATIVE 10/20/2020 1219   HGBUR NEGATIVE 10/20/2020 1219   BILIRUBINUR NEGATIVE 10/20/2020 1219   BILIRUBINUR Negative 10/11/2020 1407   KETONESUR 20 (A) 10/20/2020 1219   PROTEINUR 100 (A) 10/20/2020 1219   UROBILINOGEN 0.2 07/02/2011 0000   NITRITE NEGATIVE 10/20/2020 1219   LEUKOCYTESUR SMALL (A) 10/20/2020 1219    Radiological Exams on Admission: DG Chest Port 1 View  Result Date: 03/25/2022 CLINICAL DATA:  Fall and altered mental status. EXAM: PORTABLE CHEST 1 VIEW COMPARISON:  10/12/2020 and prior radiographs FINDINGS: Cardiomediastinal silhouette and LEFT-sided pacemaker are unchanged. There is no evidence of focal airspace disease, pulmonary edema, suspicious pulmonary nodule/mass, pleural effusion, or pneumothorax. No  acute bony abnormalities are identified. IMPRESSION: No active disease. Electronically Signed   By: Harmon Pier M.D.   On: 03/25/2022 10:46     Assessment/Plan Principal Problem:   GI bleed Active Problems:   Essential hypertension   GERD   Cardiac pacemaker in situ   Embolic stroke (HCC) R brain s/p tPA d/t AF   Paroxysmal atrial fibrillation (HCC)   Hyperlipidemia LDL goal <70   Hemiparesis affecting left side as late effect of cerebrovascular accident (CVA) (HCC)    Acute blood loss anemia secondary to upper GI bleed Transfuse for hemoglobin less than 7 and monitor hemoglobin every 6 hours IV fluid, gentle PPI infusion Appreciate GI evaluation for endoscopy N.p.o. Hold home Eliquis  PAF with PPM on chronic anticoagulation Hold Eliquis Continue telemetry monitoring  Prior CVA  Hypertension Hold antihypertensives  Dyslipidemia Hold statin  GERD IV PPI infusion as ordered  Dementia with behavioral disturbances Hold donepezil Hold Seroquel   DVT prophylaxis: SCDs Code Status: DNR Family Communication: Daughter, [name] at bedside 9/23 Disposition Plan: Admit for GI bleed evaluation Consults called: GI Admission status: Inpatient, telemetry  Severity of Illness: The appropriate patient status for this patient is INPATIENT. Inpatient status is judged to be reasonable and necessary in order to provide the required intensity of service to ensure the patient's safety. The patient's presenting symptoms, physical exam findings, and initial radiographic and laboratory data in the context of their chronic comorbidities is felt to place them at high risk for further clinical deterioration. Furthermore, it is not anticipated that the patient will be medically stable for discharge from the hospital within 2 midnights of admission.   * I certify that at the point of admission it is my clinical judgment that the patient will require inpatient hospital care spanning beyond 2  midnights from the point of admission due to high intensity of service, high risk for further deterioration and high frequency of surveillance required.*   Shemeca Lukasik D Reene Harlacher DO [facility]  If 7PM-7AM, please contact night-coverage [email or web address]  03/25/2022, 12:44 PM

## 2022-03-25 NOTE — Anesthesia Postprocedure Evaluation (Signed)
Anesthesia Post Note  Patient: JEROLYN FLENNIKEN  Procedure(s) Performed: ESOPHAGOGASTRODUODENOSCOPY (EGD) WITH PROPOFOL  Patient location during evaluation: ICU Anesthesia Type: General Level of consciousness: patient remains intubated per anesthesia plan Pain management: pain level controlled Vital Signs Assessment: post-procedure vital signs reviewed and stable Respiratory status: patient on ventilator - see flowsheet for VS Cardiovascular status: blood pressure returned to baseline and stable Postop Assessment: no apparent nausea or vomiting Anesthetic complications: no   No notable events documented.   Last Vitals:  Vitals:   03/25/22 1530 03/25/22 1607  BP: (!) 155/58 (!) 143/96  Pulse: (!) 58 92  Resp: 18 18  Temp:    SpO2: 98% 96%    Last Pain:  Vitals:   03/25/22 1344  TempSrc: Oral  PainSc:                  Louann Sjogren

## 2022-03-25 NOTE — ED Notes (Signed)
DNR armband placed on pt R wrist.

## 2022-03-25 NOTE — ED Provider Notes (Signed)
[facility] EMERGENCY DEPARTMENT Provider Note   CSN: [id number] Arrival date & time: 03/25/22  5009     History  Chief Complaint  Patient presents with   Hemoptysis    Kelsey Thompson is a 88 y.o. female with history of hypertension, GERD, stroke with left-sided deficits, paroxysmal A-fib on Eliquis, and hyperlipidemia presents the emergency department with concern for spitting/vomiting blood.  Patient lives at a assisted living facility, and staff found her this morning after breakfast with blood on her pants as well as in the trash can.  Patient is unsure if she has been coughing up blood or vomiting, but has felt very nauseous.  States that it hurts to swallow, but not complaining of any other pain.  Daughter at bedside reports that the facility had to give her some laxatives last week to have a bowel movement, the patient states her bowels have been moving normally since then.  Denies any diarrhea.  Daughter says noticed some dark stools. She also reports patient is currently being treated with ABX for UTI.   HPI     Home Medications Prior to Admission medications   Medication Sig Start Date End Date Taking? Authorizing Provider  acetaminophen (TYLENOL) 500 MG tablet Take 500 mg by mouth every 6 (six) hours as needed for mild pain or moderate pain.    [provider]  amLODipine (NORVASC) 5 MG tablet Take 1 tablet (5 mg total) by mouth daily. 04/07/19   Mechele Claude, MD  apixaban (ELIQUIS) 2.5 MG TABS tablet Take 1 tablet (2.5 mg total) by mouth 2 (two) times daily. 04/07/19   Mechele Claude, MD  calcium-vitamin D (OSCAL WITH D) 500-200 MG-UNIT per tablet Take 1 tablet by mouth daily.    [provider]  ciprofloxacin (CIPRO) 500 MG tablet Take 1 tablet (500 mg total) by mouth 2 (two) times daily. One po bid x 7 days 10/13/20   Gilda Crease, MD  clindamycin (CLEOCIN) 150 MG capsule Take 150 mg by mouth every 6 (six) hours. Patient not taking: Reported on  10/12/2020 08/17/20   [provider]  donepezil (ARICEPT) 5 MG tablet Take 1 tablet (5 mg total) by mouth at bedtime. 05/19/19   Mechele Claude, MD  enalapril (VASOTEC) 5 MG tablet Take 1 tablet (5 mg total) by mouth daily. 04/07/19   Mechele Claude, MD  ferrous sulfate 325 (65 FE) MG tablet Take 1 tablet (325 mg total) by mouth daily with breakfast. 04/07/19   Mechele Claude, MD  omeprazole (PRILOSEC) 20 MG capsule Take 1 capsule (20 mg total) by mouth daily. 04/07/19   Mechele Claude, MD  pravastatin (PRAVACHOL) 20 MG tablet Take 1 tablet (20 mg total) by mouth daily at 6 PM. 04/07/19   Mechele Claude, MD  Propylene Glycol (SYSTANE COMPLETE OP) Apply 1 drop to eye 4 (four) times daily.    [provider]  QUEtiapine (SEROQUEL) 25 MG tablet Take 1 tablet (25 mg total) by mouth at bedtime. Patient not taking: Reported on 10/17/2020 05/19/19   Mechele Claude, MD  triamcinolone cream (KENALOG) 0.1 % Apply 1 application topically 3 (three) times daily. Avoid face and genitalia Patient not taking: No sig reported 05/19/19   Mechele Claude, MD  vitamin C (ASCORBIC ACID) 500 MG tablet Take 500 mg by mouth daily.    [provider]  zinc sulfate 220 (50 Zn) MG capsule Take 1 capsule (220 mg total) by mouth daily. 04/07/19   Mechele Claude, MD  Allergies    Horse-derived products, Penicillins, and Tetanus antitoxin    Review of Systems   Review of Systems  Constitutional:  Negative for chills and fever.  HENT:  Positive for sore throat.   Respiratory:  Negative for shortness of breath.   Cardiovascular:  Negative for chest pain.  Gastrointestinal:  Positive for blood in stool, nausea and vomiting. Negative for abdominal pain and diarrhea.  Genitourinary:  Positive for dysuria and urgency.  All other systems reviewed and are negative.   Physical Exam Updated Vital Signs BP 127/69   Pulse 60   Temp 97.9 F (36.6 C) (Oral)   Resp 14   SpO2 100%  Physical Exam Vitals  and nursing note reviewed. Exam conducted with a chaperone present.  Constitutional:      Appearance: Normal appearance.     Comments: Elderly.  Actively retching with frothy blood-tinged sputum  HENT:     Head: Normocephalic and atraumatic.  Eyes:     Conjunctiva/sclera: Conjunctivae normal.  Cardiovascular:     Rate and Rhythm: Normal rate and regular rhythm.  Pulmonary:     Effort: Pulmonary effort is normal. No respiratory distress.     Breath sounds: Normal breath sounds.  Abdominal:     General: There is no distension.     Palpations: Abdomen is soft.     Tenderness: There is no abdominal tenderness.  Genitourinary:    Rectum: Guaiac result positive.  Skin:    General: Skin is warm and dry.  Neurological:     General: No focal deficit present.     Mental Status: She is alert.     ED Results / Procedures / Treatments   Labs (all labs ordered are listed, but only abnormal results are displayed) Labs Reviewed  CBC - Abnormal; Notable for the following components:      Result Value   RBC 3.07 (*)    Hemoglobin 9.4 (*)    HCT 28.7 (*)    All other components within normal limits  PROTIME-INR - Abnormal; Notable for the following components:   Prothrombin Time 16.4 (*)    INR 1.3 (*)    All other components within normal limits  COMPREHENSIVE METABOLIC PANEL - Abnormal; Notable for the following components:   Glucose, Bld 131 (*)    BUN 34 (*)    Total Protein 6.3 (*)    Albumin 3.1 (*)    GFR, Estimated 57 (*)    All other components within normal limits  POC OCCULT BLOOD, ED - Abnormal  TYPE AND SCREEN  ABO/RH    EKG None  Radiology DG Chest Port 1 View  Result Date: 03/25/2022 CLINICAL DATA:  Fall and altered mental status. EXAM: PORTABLE CHEST 1 VIEW COMPARISON:  10/12/2020 and prior radiographs FINDINGS: Cardiomediastinal silhouette and LEFT-sided pacemaker are unchanged. There is no evidence of focal airspace disease, pulmonary edema, suspicious  pulmonary nodule/mass, pleural effusion, or pneumothorax. No acute bony abnormalities are identified. IMPRESSION: No active disease. Electronically Signed   By: Harmon Pier M.D.   On: 03/25/2022 10:46    Procedures Procedures    Medications Ordered in ED Medications  pantoprozole (PROTONIX) 80 mg /NS 100 mL infusion (8 mg/hr Intravenous New Bag/Given 03/25/22 1047)  sodium chloride 0.9 % bolus 1,000 mL (0 mLs Intravenous Stopped 03/25/22 1124)    And  0.9 %  sodium chloride infusion ( Intravenous New Bag/Given 03/25/22 1123)  pantoprazole (PROTONIX) 80 mg /NS 100 mL IVPB (0 mg Intravenous Stopped 03/25/22  1124)  ondansetron (ZOFRAN) injection 4 mg (4 mg Intravenous Given 03/25/22 1046)    ED Course/ Medical Decision Making/ A&P                           Medical Decision Making Amount and/or Complexity of Data Reviewed Labs: ordered. Radiology: ordered.  This patient is a 88 y.o. female  who presents to the ED for concern of hemoptysis vs hematemesis.   Differential diagnoses prior to evaluation: The emergent differential diagnosis includes, but is not limited to,  Bronchitis, bronchiectasis, pulmonary embolism, AVM, pneumonia, anticoagulation, acute cough, diverticulitis, IBD, colitis, mesenteric ischemia, colorectal cancer / polyps, hemorrhoids, rectal foreign body, anal fissure. This is not an exhaustive differential.   Past Medical History / Co-morbidities: hypertension, GERD, stroke with left-sided deficits, paroxysmal A-fib on Eliquis, and hyperlipidemia  Physical Exam: Physical exam performed. The pertinent findings include: Stable vital signs.  Actively retching.  Abdomen soft, nontender.  Guaiac positive.  Lab Tests/Imaging studies: I personally interpreted labs/imaging and the pertinent results include: Hemoglobin 9.4, compared to 9.9 in October 2022.  CMP unremarkable.  PT 16.4, INR 1.3.  Hemoccult positive.  Type and screen pending.  X-ray without acute cardiopulmonary  abnormalities.  I agree with the radiologist interpretation.  Medications: I ordered medication including Zofran, IV fluids, Protonix.  I have reviewed the patients home medicines and have made adjustments as needed.  Consultations obtained: I consulted with gastroenterologist Dr Sydell Axon who will consult on the patient once admitted with plans for endoscopy.   I consulted with hospitalist Dr Manuella Ghazi who will admit.    Disposition: After consideration of the diagnostic results and the patients response to treatment, I feel that patient would benefit from medical admission for GI bleed.   I discussed this case with my attending physician Dr. Alvino Chapel who cosigned this note including patient's presenting symptoms, physical exam, and planned diagnostics and interventions. Attending physician stated agreement with plan or made changes to plan which were implemented.    Final Clinical Impression(s) / ED Diagnoses Final diagnoses:  Gastrointestinal hemorrhage, unspecified gastrointestinal hemorrhage type  Melena  Hematemesis with nausea    Rx / DC Orders ED Discharge Orders     None      Portions of this report may have been transcribed using voice recognition software. Every effort was made to ensure accuracy; however, inadvertent computerized transcription errors may be present.    Estill Cotta 03/25/22 1158    Davonna Belling, MD 03/26/22 [phone]

## 2022-03-25 NOTE — Anesthesia Preprocedure Evaluation (Signed)
Anesthesia Evaluation  Patient identified by MRN, date of birth, ID band Patient confused    Reviewed: Allergy & Precautions, H&P , NPO status , Patient's Chart, lab work & pertinent test results, reviewed documented beta blocker date and time , Unable to perform ROS - Chart review only  Airway Mallampati: II  TM Distance: >3 FB Neck ROM: full    Dental no notable dental hx.    Pulmonary neg pulmonary ROS, former smoker,    Pulmonary exam normal breath sounds clear to auscultation       Cardiovascular Exercise Tolerance: Good hypertension, + pacemaker  Rhythm:regular Rate:Normal     Neuro/Psych  Neuromuscular disease CVA, Residual Symptoms negative psych ROS   GI/Hepatic Neg liver ROS, hiatal hernia, GERD  ,  Endo/Other  negative endocrine ROS  Renal/GU negative Renal ROS  negative genitourinary   Musculoskeletal   Abdominal   Peds  Hematology negative hematology ROS (+)   Anesthesia Other Findings   Reproductive/Obstetrics negative OB ROS                             Anesthesia Physical Anesthesia Plan  ASA: 4 and emergent  Anesthesia Plan: General and General ETT   Post-op Pain Management:    Induction:   PONV Risk Score and Plan: Ondansetron  Airway Management Planned: Oral ETT  Additional Equipment:   Intra-op Plan:   Post-operative Plan: Extubation in OR  Informed Consent: I have reviewed the patients History and Physical, chart, labs and discussed the procedure including the risks, benefits and alternatives for the proposed anesthesia with the patient or authorized representative who has indicated his/her understanding and acceptance.    Suspend DNR.   Dental Advisory Given  Plan Discussed with: CRNA  Anesthesia Plan Comments: (No SUX - stroke with right hemiparesis Glidescope Rapid sequence intubation Extensive Pre-O2 Avoid bag-mask  )         Anesthesia Quick Evaluation

## 2022-03-25 NOTE — ED Triage Notes (Signed)
Patient brought in via ems from [facility]. Patient ate breakfast this morning and went to room. Per staff patient was found wit blood on jeans and in trash can. Patient states she may have coughed it up or vomited she is is unsure. Hx of dementia

## 2022-03-25 NOTE — Plan of Care (Signed)

## 2022-03-25 NOTE — Op Note (Addendum)
[facility] Patient Name: Kelsey Thompson Procedure Date: 03/25/2022 3:31 PM MRN: [id number] Date of Birth: [birth year] Attending MD: Norvel Richards , MD CSN: [id number] Age: 88 Admit Type: Inpatient Procedure:                Upper GI endoscopy Indications:              Dysphagia, Hematemesis; Kcentra reversal of Eliquis Providers:                Norvel Richards, MD, Lurline Del, RN, Casimer Bilis, Technician Referring MD:              Medicines:                General Anesthesia Complications:            No immediate complications. Estimated Blood Loss:     Estimated blood loss: none (from procedure). Procedure:                Pre-Anesthesia Assessment:                           - Prior to the procedure, a History and Physical                            was performed, and patient medications and                            allergies were reviewed. The patient's tolerance of                            previous anesthesia was also reviewed. The risks                            and benefits of the procedure and the sedation                            options and risks were discussed with the patient.                            All questions were answered, and informed consent                            was obtained. Prior Anticoagulants: The patient has                            taken no previous anticoagulant or antiplatelet                            agents and last took Eliquis (apixaban) on the day                            of the procedure. ASA Grade Assessment: IV - A  patient with severe systemic disease that is a                            constant threat to life. After reviewing the risks                            and benefits, the patient was deemed in                            satisfactory condition to undergo the procedure.                           After obtaining informed consent, the endoscope was                             passed under direct vision. Throughout the                            procedure, the patient's blood pressure, pulse, and                            oxygen saturations were monitored continuously. The                            [id number]) scope was introduced through the                            mouth, and advanced to the second part of duodenum. Scope In: 4:23:18 PM Scope Out: 5:05:08 PM Total Procedure Duration: 0 hours 41 minutes 50 seconds  Findings:      Large "double barreled" Zenker's diverticulum markedly displacing the       esophageal inlet. In fact, could not initially find the esophageal inlet       utilizing the standard gastroscope. I withdrew and obtained the       ultraslim gastroscope and identified the markedly displaced esophageal       inlet. I was then able to intubate the esophagus; at that point, I found       a large amount of clot present in the esophagus. It was decided to       interrupt the procedure for endotracheal intubation per original plan.       This was done uneventfully. I then introduced the adult gastroscope back       into the tubular esophagus. Patient had a large amount of adherent clot       with some fresh blood oozing from underneath. It appeared this was       involving the mid and distal esophagus; there did appear to be extensive       ulceration. No obvious tumor identified. The tubular esophagus was       patent without a tight stricture being seen. Some residual clot in the       stomach. Moderate size hiatal hernia. Grossly normal gastric mucosa.       Patent pylorus. Normal first and second portion of the duodenum.      Attempted to wash out/displace the large volume of clot in the esophagus  was not successful. I elected to spray the entire tubular esophagus with       Hemospray. This was deployed successfully without difficulty. Impression:               - Large "double barrel "Zenker's  diverticulum                            markedly displacing the esophageal lumen.                           -Active bleeding with large amount of clot in the                            tubular esophagus. Incomplete examination; however,                            esophageal ulceration was seen. Hemospray deployed.                           -Moderate size hiatal hernia. Grossly normal                            gastric and duodenal mucosa through the second                            portion. Moderate Sedation:      Moderate (conscious) sedation was personally administered by an       anesthesia professional. The following parameters were monitored: oxygen       saturation, heart rate, blood pressure, respiratory rate, EKG, adequacy       of pulmonary ventilation, and response to care. Recommendation:           -Admit patient to ICU for ongoing care. Keep                            intubated and sedated until after repeat EGD on 9/25                           - IV PPI infusion.                           -Trend H&H. Plan for repeat EGD on 9/25. I spoke                            personally with patient's daughter, Tedra Coupe, at                            length regarding my findings today and recommended                            / plan of management. Questions answered. Procedure Code(s):        --- Professional ---                           [phone], Esophagogastroduodenoscopy, flexible,  transoral; diagnostic, including collection of                            specimen(s) by brushing or washing, when performed                            (separate procedure) Diagnosis Code(s):        --- Professional ---                           R13.10, Dysphagia, unspecified                           K92.0, Hematemesis CPT copyright 2019 [employer]. All rights reserved. The codes documented in this report are preliminary and upon coder review may  be revised  to meet current compliance requirements. Cristopher Estimable. Seaver Machia, MD Norvel Richards, MD 03/25/2022 5:49:47 PM This report has been signed electronically. Number of Addenda: 0

## 2022-03-25 NOTE — Anesthesia Procedure Notes (Signed)
Procedure Name: Intubation Date/Time: 03/25/2022 4:44 PM  Performed by: Louann Sjogren, MDPre-anesthesia Checklist: Patient identified, Emergency Drugs available, Suction available and Patient being monitored Patient Re-evaluated:Patient Re-evaluated prior to induction Oxygen Delivery Method: Circle system utilized Preoxygenation: Pre-oxygenation with 100% oxygen Induction Type: IV induction Ventilation: Mask ventilation without difficulty Laryngoscope Size: Glidescope and 3 Grade View: Grade I Tube type: Oral Tube size: 7.0 mm Number of attempts: 1 Airway Equipment and Method: Stylet and Oral airway Placement Confirmation: ETT inserted through vocal cords under direct vision, positive ETCO2 and breath sounds checked- equal and bilateral Tube secured with: Tape Dental Injury: Teeth and Oropharynx as per pre-operative assessment

## 2022-03-25 NOTE — Consult Note (Signed)
Referring Provider: No ref. provider found Primary Care Physician:  Mechele Claude, MD Primary Gastroenterologist:  Dr.Razan Siler  Reason for Consultation: Hematemesis   HPI: 88 year old lady with chronic atrial fibrillation on Eliquis brought to the ED by EMS from the nursing home with hematemesis.  Found to be hemodynamically stable.  Noted to be spitting up blood-tinged mucus in the ED.  Dark brown stool on DRE Hemoccult positive.  Hemoglobin 9.4; was 12.8 10/20/2020.  Patient's daughter, [name], is at the bedside and tells me patient has had progressive difficulty swallowing along with weight loss over the past 6 months.  She has gone from a regular diet to ground meat to what sounds like full liquids most recently.  Had some acute difficulties with attempting to swallow yesterday per family report.  Long history of GERD on omeprazole 20 mg daily.  Aside from GERD, no history of GI illness.  No prior EGD or colonoscopy.  In the ED, I observed the patient to be quite agitated spitting none bloody appearing saliva into the emesis bag continually with the bag containing about a pint of saliva.  I was called to evaluate her in the ED.  She has been admitted.  She is awaiting a bed.  Last dose of Eliquis this a.m.  Prior to this acute illness, aside from a dysphagia and GERD apparently no history of bowel dysfunction, abdominal pain,nausea or vomiting. Past Medical History:  Diagnosis Date   Arthritis    Bradycardia    GERD (gastroesophageal reflux disease)    Hiatal hernia    Hypercholesterolemia    Hypertension    Pacemaker    Stroke [facility])     Past Surgical History:  Procedure Laterality Date   APPENDECTOMY     CESAREAN SECTION     PACEMAKER INSERTION     TONSILLECTOMY      Prior to Admission medications   Medication Sig Start Date End Date Taking? Authorizing Provider  acetaminophen (TYLENOL) 500 MG tablet Take 500 mg by mouth every 6 (six) hours as needed for  mild pain or moderate pain.    [provider]  amLODipine (NORVASC) 5 MG tablet Take 1 tablet (5 mg total) by mouth daily. 04/07/19   Mechele Claude, MD  apixaban (ELIQUIS) 2.5 MG TABS tablet Take 1 tablet (2.5 mg total) by mouth 2 (two) times daily. 04/07/19   Mechele Claude, MD  calcium-vitamin D (OSCAL WITH D) 500-200 MG-UNIT per tablet Take 1 tablet by mouth daily.    [provider]  ciprofloxacin (CIPRO) 500 MG tablet Take 1 tablet (500 mg total) by mouth 2 (two) times daily. One po bid x 7 days 10/13/20   Gilda Crease, MD  clindamycin (CLEOCIN) 150 MG capsule Take 150 mg by mouth every 6 (six) hours. Patient not taking: Reported on 10/12/2020 08/17/20   [provider]  donepezil (ARICEPT) 5 MG tablet Take 1 tablet (5 mg total) by mouth at bedtime. 05/19/19   Mechele Claude, MD  enalapril (VASOTEC) 5 MG tablet Take 1 tablet (5 mg total) by mouth daily. 04/07/19   Mechele Claude, MD  ferrous sulfate 325 (65 FE) MG tablet Take 1 tablet (325 mg total) by mouth daily with breakfast. 04/07/19   Mechele Claude, MD  omeprazole (PRILOSEC) 20 MG capsule Take 1 capsule (20 mg total) by mouth daily. 04/07/19   Mechele Claude, MD  pravastatin (PRAVACHOL) 20 MG tablet Take 1 tablet (20 mg total) by mouth daily at 6 PM. 04/07/19  Mechele Claude, MD  Propylene Glycol (SYSTANE COMPLETE OP) Apply 1 drop to eye 4 (four) times daily.    [provider]  QUEtiapine (SEROQUEL) 25 MG tablet Take 1 tablet (25 mg total) by mouth at bedtime. Patient not taking: Reported on 10/17/2020 05/19/19   Mechele Claude, MD  triamcinolone cream (KENALOG) 0.1 % Apply 1 application topically 3 (three) times daily. Avoid face and genitalia Patient not taking: No sig reported 05/19/19   Mechele Claude, MD  vitamin C (ASCORBIC ACID) 500 MG tablet Take 500 mg by mouth daily.    [provider]  zinc sulfate 220 (50 Zn) MG capsule Take 1 capsule (220 mg total) by mouth daily. 04/07/19    Mechele Claude, MD    Current Facility-Administered Medications  Medication Dose Route Frequency Provider Last Rate Last Admin   acetaminophen (TYLENOL) tablet 650 mg  650 mg Oral Q6H PRN Sherryll Burger, Pratik D, DO       Or   acetaminophen (TYLENOL) suppository 650 mg  650 mg Rectal Q6H PRN Sherryll Burger, Pratik D, DO       lactated ringers infusion   Intravenous Continuous Sherryll Burger, Pratik D, DO       ondansetron (ZOFRAN) tablet 4 mg  4 mg Oral Q6H PRN Sherryll Burger, Pratik D, DO       Or   ondansetron (ZOFRAN) injection 4 mg  4 mg Intravenous Q6H PRN Sherryll Burger, Pratik D, DO       pantoprozole (PROTONIX) 80 mg /NS 100 mL infusion  8 mg/hr Intravenous Continuous Sherryll Burger, Pratik D, DO 10 mL/hr at 03/25/22 1047 8 mg/hr at 03/25/22 1047   Current Outpatient Medications  Medication Sig Dispense Refill   acetaminophen (TYLENOL) 500 MG tablet Take 500 mg by mouth every 6 (six) hours as needed for mild pain or moderate pain.     amLODipine (NORVASC) 5 MG tablet Take 1 tablet (5 mg total) by mouth daily. 90 tablet 1   apixaban (ELIQUIS) 2.5 MG TABS tablet Take 1 tablet (2.5 mg total) by mouth 2 (two) times daily. 180 tablet 1   calcium-vitamin D (OSCAL WITH D) 500-200 MG-UNIT per tablet Take 1 tablet by mouth daily.     ciprofloxacin (CIPRO) 500 MG tablet Take 1 tablet (500 mg total) by mouth 2 (two) times daily. One po bid x 7 days 14 tablet 0   clindamycin (CLEOCIN) 150 MG capsule Take 150 mg by mouth every 6 (six) hours. (Patient not taking: Reported on 10/12/2020)     donepezil (ARICEPT) 5 MG tablet Take 1 tablet (5 mg total) by mouth at bedtime. 30 tablet 1   enalapril (VASOTEC) 5 MG tablet Take 1 tablet (5 mg total) by mouth daily. 90 tablet 1   ferrous sulfate 325 (65 FE) MG tablet Take 1 tablet (325 mg total) by mouth daily with breakfast. 90 tablet 1   omeprazole (PRILOSEC) 20 MG capsule Take 1 capsule (20 mg total) by mouth daily. 90 capsule 1   pravastatin (PRAVACHOL) 20 MG tablet Take 1 tablet (20 mg total) by mouth daily at  6 PM. 90 tablet 1   Propylene Glycol (SYSTANE COMPLETE OP) Apply 1 drop to eye 4 (four) times daily.     QUEtiapine (SEROQUEL) 25 MG tablet Take 1 tablet (25 mg total) by mouth at bedtime. (Patient not taking: Reported on 10/17/2020) 30 tablet 2   triamcinolone cream (KENALOG) 0.1 % Apply 1 application topically 3 (three) times daily. Avoid face and genitalia (Patient not taking: No sig reported)  45 g 0   vitamin C (ASCORBIC ACID) 500 MG tablet Take 500 mg by mouth daily.     zinc sulfate 220 (50 Zn) MG capsule Take 1 capsule (220 mg total) by mouth daily. 90 capsule 1    Allergies as of 03/25/2022 - Review Complete 03/25/2022  Allergen Reaction Noted   Horse-derived products Other (See Comments)    Penicillins Other (See Comments)    Tetanus antitoxin  01/16/2019    Family History  Problem Relation Age of Onset   Heart failure Mother    Diabetes Father    Heart attack Father     Social History   Socioeconomic History   Marital status: Widowed    Spouse name: Not on file   Number of children: Not on file   Years of education: Not on file   Highest education level: Not on file  Occupational History   Not on file  Tobacco Use   Smoking status: Former    Types: Cigarettes    Quit date: 06/30/1981    Years since quitting: 40.7   Smokeless tobacco: Never  Vaping Use   Vaping Use: Never used  Substance and Sexual Activity   Alcohol use: No    Alcohol/week: 0.0 standard drinks of alcohol   Drug use: No   Sexual activity: Never  Other Topics Concern   Not on file  Social History Narrative   Not on file   Social Determinants of Health   Financial Resource Strain: Not on file  Food Insecurity: Not on file  Transportation Needs: Not on file  Physical Activity: Not on file  Stress: Not on file  Social Connections: Not on file  Intimate Partner Violence: Not on file    Review of Systems: As in history of present illness  Physical Exam: Vital signs in last 24  hours: Temp:  [97.9 F (36.6 C)] 97.9 F (36.6 C) (09/23 0938) Pulse Rate:  [60-80] 60 (09/23 1130) Resp:  [14-19] 14 (09/23 1130) BP: (120-143)/(56-69) 127/69 (09/23 1130) SpO2:  [96 %-100 %] 100 % (09/23 1130)   General:   Alert, elderly lady who is conversive but quite agitated: Accompanied by her daughter, Gae Gallop.   Mouth:  No deformity or lesions, partial plate  Neck:  Supple; no masses or thyromegaly. Lungs:  Clear throughout to auscultation.   No wheezes, crackles, or rhonchi. No acute distress. Heart:  Regular rate and rhythm; no murmurs, clicks, rubs,  or gallops. Abdomen: Positive bowel sounds.  Soft, nontender no obvious mass organomegaly Rectal: Deferred; done by EDP  intake/Output from previous day: No intake/output data recorded. Intake/Output this shift: No intake/output data recorded.  Lab Results: Recent Labs    03/25/22 1005  WBC 5.6  HGB 9.4*  HCT 28.7*  PLT 260   BMET Recent Labs    03/25/22 1005  NA 139  K 4.1  CL 105  CO2 28  GLUCOSE 131*  BUN 34*  CREATININE 0.91  CALCIUM 9.3   LFT Recent Labs    03/25/22 1005  PROT 6.3*  ALBUMIN 3.1*  AST 20  ALT 20  ALKPHOS 57  BILITOT 0.6   PT/INR Recent Labs    03/25/22 1005  LABPROT 16.4*  INR 1.3*    Impression:   88 year old lady with atrial fibrillation on chronic Eliquis brought to the emergency room today with chief complaint of spitting up blood.  Progressive dysphagia to solids.  Decline in hemoglobin from last year.  Blood in her sputum  noted earlier today.  Hemoccult positive.  Remains hemodynamically stable.  Progressive esophageal dysphagia over the past 6 months  - acutely worse over the past 24 hours. Expectorating high volume of saliva, nonbloody in appearance, this afternoon.  Observation is highly suggestive of esophageal obstruction.  I suspect patient has esophageal food impaction in the setting of some degree of esophageal obstruction.  I am concerned that acute  obstruction has led to heaving and possibly a Mallory-Weiss tear.  Could have significant baseline reflux esophagitis.  Cannot rule out a tumor.  Patient is miserable.  Family wants something done.  Recommendations:  I had a lengthy discussion with patient and her daughter regarding watchful waiting to see if her obstructing symptoms will pass versus proceeding with emergent EGD.  It was mutually agreed that patient needs urgent relief of her obstructing symptoms.  Last dose of Eliquis was this morning.  She has already experienced bleeding.  I have offered the patient an urgent EGD with disimpaction, etc.  Reversal of Eliquis via Kcentra required prior to embarking on EGD later this afternoon.  I explained that if patient is in need of esophageal dilation, this maneuver will be deferred to a later date.  The risks, benefits, limitations, alternatives and imponderables have been reviewed with the patient. Potential for esophageal dilation, biopsy, etc. have also been reviewed.  Questions have been answered.  Patient and family agreeable.  I have discussed with Annie Main and pharmacy, Dr. Leroy Libman and ED staff.  Further recommendations to follow.       Notice:  This dictation was prepared with Dragon dictation along with smaller phrase technology. Any transcriptional errors that result from this process are unintentional and may not be corrected upon review.

## 2022-03-25 NOTE — Transfer of Care (Signed)
Immediate Anesthesia Transfer of Care Note  Patient: Kelsey Thompson  Procedure(s) Performed: ESOPHAGOGASTRODUODENOSCOPY (EGD) WITH PROPOFOL  Patient Location: ICU  Anesthesia Type:General  Level of Consciousness: Patient remains intubated per anesthesia plan  Airway & Oxygen Therapy: Patient remains intubated per anesthesia plan and Patient placed on Ventilator (see vital sign flow sheet for setting)  Post-op Assessment: Report given to RN and Post -op Vital signs reviewed and stable  Post vital signs: Reviewed and stable  Last Vitals:  Vitals Value Taken Time  BP 110/65   Temp    Pulse 94   Resp VENT   SpO2 100%     Last Pain:  Vitals:   03/25/22 1344  TempSrc: Oral  PainSc:          Complications: No notable events documented.

## 2022-03-26 DIAGNOSIS — K922 Gastrointestinal hemorrhage, unspecified: Secondary | ICD-10-CM | POA: Diagnosis not present

## 2022-03-26 DIAGNOSIS — K920 Hematemesis: Secondary | ICD-10-CM | POA: Diagnosis present

## 2022-03-26 LAB — COMPREHENSIVE METABOLIC PANEL
ALT: 20 U/L (ref 0–44)
AST: 17 U/L (ref 15–41)
Albumin: 2.6 g/dL — ABNORMAL LOW (ref 3.5–5.0)
Alkaline Phosphatase: 48 U/L (ref 38–126)
Anion gap: 7 (ref 5–15)
BUN: 21 mg/dL (ref 8–23)
CO2: 24 mmol/L (ref 22–32)
Calcium: 8.5 mg/dL — ABNORMAL LOW (ref 8.9–10.3)
Chloride: 105 mmol/L (ref 98–111)
Creatinine, Ser: 0.75 mg/dL (ref 0.44–1.00)
GFR, Estimated: >60 mL/min (ref >60)
Glucose, Bld: 141 mg/dL — ABNORMAL HIGH (ref 70–99)
Potassium: 3.3 mmol/L — ABNORMAL LOW (ref 3.5–5.1)
Sodium: 136 mmol/L (ref 135–145)
Total Bilirubin: 0.5 mg/dL (ref 0.3–1.2)
Total Protein: 5.3 g/dL — ABNORMAL LOW (ref 6.5–8.1)

## 2022-03-26 LAB — GLUCOSE, CAPILLARY
Glucose-Capillary: 126 mg/dL — ABNORMAL HIGH (ref 70–99)
Glucose-Capillary: 126 mg/dL — ABNORMAL HIGH (ref 70–99)
Glucose-Capillary: 131 mg/dL — ABNORMAL HIGH (ref 70–99)
Glucose-Capillary: 141 mg/dL — ABNORMAL HIGH (ref 70–99)
Glucose-Capillary: 146 mg/dL — ABNORMAL HIGH (ref 70–99)

## 2022-03-26 LAB — CBC
HCT: 24.4 % — ABNORMAL LOW (ref 36.0–46.0)
Hemoglobin: 8.3 g/dL — ABNORMAL LOW (ref 12.0–15.0)
MCH: 30.5 pg (ref 26.0–34.0)
MCHC: 34 g/dL (ref 30.0–36.0)
MCV: 89.7 fL (ref 80.0–100.0)
Platelets: 257 10*3/uL (ref 150–400)
RBC: 2.72 MIL/uL — ABNORMAL LOW (ref 3.87–5.11)
RDW: 14.5 % (ref 11.5–15.5)
WBC: 9.1 10*3/uL (ref 4.0–10.5)
nRBC: 0 % (ref 0.0–0.2)

## 2022-03-26 LAB — BLOOD GAS, ARTERIAL
Acid-Base Excess: 5.2 mmol/L — ABNORMAL HIGH (ref 0.0–2.0)
Bicarbonate: 26.9 mmol/L (ref 20.0–28.0)
Drawn by: 22223
FIO2: 35 %
O2 Saturation: 98.7 %
Patient temperature: 37
pCO2 arterial: 30 mmHg — ABNORMAL LOW (ref 32–48)
pH, Arterial: 7.56 — ABNORMAL HIGH (ref 7.35–7.45)
pO2, Arterial: 144 mmHg — ABNORMAL HIGH (ref 83–108)

## 2022-03-26 LAB — MAGNESIUM: Magnesium: 1.7 mg/dL (ref 1.7–2.4)

## 2022-03-26 LAB — TRIGLYCERIDES: Triglycerides: 32 mg/dL (ref <150)

## 2022-03-26 MED ORDER — POTASSIUM CHLORIDE 10 MEQ/100ML IV SOLN
10.0000 meq | INTRAVENOUS | Status: AC
  Administered 2022-03-26 (×3): 10 meq via INTRAVENOUS
  Filled 2022-03-26 (×3): qty 100

## 2022-03-26 MED ORDER — ORAL CARE MOUTH RINSE
15.0000 mL | OROMUCOSAL | Status: DC
  Administered 2022-03-26 – 2022-03-28 (×23): 15 mL via OROMUCOSAL

## 2022-03-26 MED ORDER — ORAL CARE MOUTH RINSE: 15.0000 mL | OROMUCOSAL | Status: DC | PRN

## 2022-03-26 MED ORDER — HYDRALAZINE HCL 20 MG/ML IJ SOLN: 10.0000 mg | INTRAMUSCULAR | Status: DC | PRN

## 2022-03-26 NOTE — Plan of Care (Signed)

## 2022-03-26 NOTE — Progress Notes (Signed)
PROGRESS NOTE    Kelsey Thompson  XHB:[id number] DOB: [birth year] DOA: 03/25/2022 PCP: Mechele Claude, MD   Brief Narrative:     Kelsey Thompson is a 88 y.o. female with medical history significant for paroxysmal atrial fibrillation on anticoagulation with Eliquis, pacemaker placement, prior CVA, hypertension, dyslipidemia, GERD, and dementia who was brought to the ED for evaluation of hematemesis that began earlier on 9/23 at her ALF.  She had her Eliquis reversed and underwent urgent EGD on 9/23 with findings of Zenker's diverticulum with large blood clot and signs of active bleeding.  She had Hemospray performed and has plans for repeat EGD by 9/25 and currently remains intubated on mechanical ventilation.  Assessment & Plan:   Principal Problem:   GI bleed Active Problems:   Essential hypertension   GERD   Cardiac pacemaker in situ   Embolic stroke (HCC) R brain s/p tPA d/t AF   Paroxysmal atrial fibrillation (HCC)   Hyperlipidemia LDL goal <70   Hemiparesis affecting left side as late effect of cerebrovascular accident (CVA) (HCC)  Assessment and Plan:   Acute blood loss anemia secondary to upper GI bleed Status post EGD 9/23 with findings of large adherent clot and active bleeding with Zenker's diverticulum, plans to repeat EGD 9/25 Transfuse for hemoglobin less than 7 and monitor hemoglobin every 6 hours IV fluid, gentle with D5 PPI infusion on going Appreciate ongoing GI evaluation N.p.o. Holding home Eliquis with reversal on 9/23   PAF with PPM on chronic anticoagulation Holding Eliquis Continue telemetry monitoring   Prior CVA   Hypertension Hold antihypertensives IV hydralazine as needed for severe elevations   Dyslipidemia Hold statin   GERD IV PPI infusion as ordered   Dementia with behavioral disturbances Hold donepezil Hold Seroquel     DVT prophylaxis:SCDs Code Status: DNR Family Communication: Discussed with sister on phone 9/24 Disposition  Plan:  Status is: Inpatient Remains inpatient appropriate because: Intubation/IV meds.  Consultants:  GI  Procedures:  EGD 9/23 Intubation 9/23  Antimicrobials:  None  Subjective: Patient seen and evaluated today.  She remains on sedation with mechanical ventilation with no acute overnight events noted.  Objective: Vitals:   03/26/22 0433 03/26/22 0500 03/26/22 0530 03/26/22 0738  BP:  (!) 141/63 (!) 148/66   Pulse:  (!) 44 (!) 45 77  Resp:  16 16 13   Temp: (!) 97.4 F (36.3 C)   (!) 97.1 F (36.2 C)  TempSrc: Axillary   Axillary  SpO2:  100% 100% 100%  Weight: 49.9 kg     Height:        Intake/Output Summary (Last 24 hours) at 03/26/2022 0901 Last data filed at 03/26/2022 0300 Gross per 24 hour  Intake 1269.93 ml  Output 2180 ml  Net -910.07 ml   Filed Weights   03/25/22 1344 03/26/22 0433  Weight: 54.9 kg 49.9 kg    Examination:  General exam: Appears sedated and intubated Respiratory system: Clear to auscultation. Respiratory effort normal.  Intubated Cardiovascular system: S1 & S2 heard, RRR.  Gastrointestinal system: Abdomen is soft Central nervous system: Sedated Extremities: No edema Skin: No significant lesions noted Psychiatry: Flat affect.    Data Reviewed: I have personally reviewed following labs and imaging studies  CBC: Recent Labs  Lab 03/25/22 1005 03/25/22 1728 03/26/22 0352  WBC 5.6  --  9.1  HGB 9.4* 7.9* 8.3*  HCT 28.7* 23.7* 24.4*  MCV 93.5  --  89.7  PLT 260  --  257  Basic Metabolic Panel: Recent Labs  Lab 03/25/22 1005 03/26/22 0352  NA 139 136  K 4.1 3.3*  CL 105 105  CO2 28 24  GLUCOSE 131* 141*  BUN 34* 21  CREATININE 0.91 0.75  CALCIUM 9.3 8.5*  MG  --  1.7   GFR: Estimated Creatinine Clearance: 30.2 mL/min (by C-G formula based on SCr of 0.75 mg/dL). Liver Function Tests: Recent Labs  Lab 03/25/22 1005 03/26/22 0352  AST 20 17  ALT 20 20  ALKPHOS 57 48  BILITOT 0.6 0.5  PROT 6.3* 5.3*   ALBUMIN 3.1* 2.6*   No results for input(s): "LIPASE", "AMYLASE" in the last 168 hours. No results for input(s): "AMMONIA" in the last 168 hours. Coagulation Profile: Recent Labs  Lab 03/25/22 1005  INR 1.3*   Cardiac Enzymes: No results for input(s): "CKTOTAL", "CKMB", "CKMBINDEX", "TROPONINI" in the last 168 hours. BNP (last 3 results) No results for input(s): "PROBNP" in the last 8760 hours. HbA1C: No results for input(s): "HGBA1C" in the last 72 hours. CBG: Recent Labs  Lab 03/25/22 1740 03/25/22 1949 03/26/22 0021 03/26/22 0446 03/26/22 0736  GLUCAP 132* 150* 146* 131* 126*   Lipid Profile: Recent Labs    03/26/22 0352  TRIG 32   Thyroid Function Tests: No results for input(s): "TSH", "T4TOTAL", "FREET4", "T3FREE", "THYROIDAB" in the last 72 hours. Anemia Panel: No results for input(s): "VITAMINB12", "FOLATE", "FERRITIN", "TIBC", "IRON", "RETICCTPCT" in the last 72 hours. Sepsis Labs: No results for input(s): "PROCALCITON", "LATICACIDVEN" in the last 168 hours.  No results found for this or any previous visit (from the past 240 hour(s)).       Radiology Studies: DG Chest Port 1 View  Result Date: 03/25/2022 CLINICAL DATA:  Fall and altered mental status. EXAM: PORTABLE CHEST 1 VIEW COMPARISON:  10/12/2020 and prior radiographs FINDINGS: Cardiomediastinal silhouette and LEFT-sided pacemaker are unchanged. There is no evidence of focal airspace disease, pulmonary edema, suspicious pulmonary nodule/mass, pleural effusion, or pneumothorax. No acute bony abnormalities are identified. IMPRESSION: No active disease. Electronically Signed   By: Margarette Canada M.D.   On: 03/25/2022 10:46        Scheduled Meds:  Chlorhexidine Gluconate Cloth  6 each Topical Daily   docusate  100 mg Per Tube BID   polyethylene glycol  17 g Per Tube Daily   Continuous Infusions:  dextrose 5% lactated ringers 50 mL/hr at 03/25/22 1757   pantoprazole 8 mg/hr (03/25/22 1047)    potassium chloride 10 mEq (03/26/22 0831)   propofol (DIPRIVAN) infusion 40 mcg/kg/min (03/26/22 0244)     LOS: 1 day    Time spent: 35 minutes    Laurin Paulo D Manuella Ghazi, DO [facility]  If 7PM-7AM, please contact night-coverage [email or web address] 03/26/2022, 9:01 AM

## 2022-03-26 NOTE — Progress Notes (Signed)
Intubated and sedated Nursing staff reports no hematemesis or stool overnight.  Hemoglobin 8.3 this a.m.; no transfusion thus far this hospitalization  Vital signs in last 24 hours: Temp:  [97.1 F (36.2 C)-98.1 F (36.7 C)] 97.1 F (36.2 C) (09/24 0738) Pulse Rate:  [36-92] 77 (09/24 0738) Resp:  [12-19] 13 (09/24 0738) BP: (120-180)/(46-96) 148/66 (09/24 0530) SpO2:  [96 %-100 %] 100 % (09/24 0738) FiO2 (%):  [28 %-50 %] 35 % (09/24 0918) Weight:  [49.9 kg-54.9 kg] 49.9 kg (09/24 0433)   General:   Intubated and sedated Abdomen: Nondistended.  Positive bowel sounds soft, no mass organomegaly     Intake/Output from previous day: 09/23 0701 - 09/24 0700 In: 1269.9 [I.V.:1269.9] Out: 2180 [Urine:2180] Intake/Output this shift: No intake/output data recorded.  Lab Results: Recent Labs    03/25/22 1005 03/25/22 1728 03/26/22 0352  WBC 5.6  --  9.1  HGB 9.4* 7.9* 8.3*  HCT 28.7* 23.7* 24.4*  PLT 260  --  257   BMET Recent Labs    03/25/22 1005 03/26/22 0352  NA 139 136  K 4.1 3.3*  CL 105 105  CO2 28 24  GLUCOSE 131* 141*  BUN 34* 21  CREATININE 0.91 0.75  CALCIUM 9.3 8.5*   LFT Recent Labs    03/26/22 0352  PROT 5.3*  ALBUMIN 2.6*  AST 17  ALT 20  ALKPHOS 48  BILITOT 0.5   PT/INR Recent Labs    03/25/22 1005  LABPROT 16.4*  INR 1.3*     Impression: 88 year old lady nursing home resident chronically anticoagulated on Eliquis admitted to the hospital yesterday with hematemesis, progressive esophageal dysphagia, weight loss over the past 6 months. Anticoagulation reversed with Kcentra.  Emergency EGD revealed a large Zenker's diverticulum displacing the esophageal inlet laterally.  Large volume of adherent clot in the tubular esophagus ulceration poorly seen under the clot.   Tubular esophagus widely patent but esophageal inlet markedly compromised due to diverticulum.  Stomach and duodenum look good aside from a hiatal hernia.  Bleeding seems  to have ceased.   Recommendations:  Keep her intubated until after repeat EGD tomorrow.  No OG tube-discussed with nursing staff  Continue IV PPI infusion.  I have updated patient's daughter Neomia Glass [phone]) this AM. I explained, Dr. Abbey Chatters would be performing the repet EGD tomorrow.   Risk and benefits again reviewed.  Questions answered.  Daughter agreeable.

## 2022-03-26 NOTE — H&P (View-Only) (Signed)
Intubated and sedated Nursing staff reports no hematemesis or stool overnight.  Hemoglobin 8.3 this a.m.; no transfusion thus far this hospitalization  Vital signs in last 24 hours: Temp:  [97.1 F (36.2 C)-98.1 F (36.7 C)] 97.1 F (36.2 C) (09/24 0738) Pulse Rate:  [36-92] 77 (09/24 0738) Resp:  [12-19] 13 (09/24 0738) BP: (120-180)/(46-96) 148/66 (09/24 0530) SpO2:  [96 %-100 %] 100 % (09/24 0738) FiO2 (%):  [28 %-50 %] 35 % (09/24 0918) Weight:  [49.9 kg-54.9 kg] 49.9 kg (09/24 0433)   General:   Intubated and sedated Abdomen: Nondistended.  Positive bowel sounds soft, no mass organomegaly     Intake/Output from previous day: 09/23 0701 - 09/24 0700 In: 1269.9 [I.V.:1269.9] Out: 2180 [Urine:2180] Intake/Output this shift: No intake/output data recorded.  Lab Results: Recent Labs    03/25/22 1005 03/25/22 1728 03/26/22 0352  WBC 5.6  --  9.1  HGB 9.4* 7.9* 8.3*  HCT 28.7* 23.7* 24.4*  PLT 260  --  257   BMET Recent Labs    03/25/22 1005 03/26/22 0352  NA 139 136  K 4.1 3.3*  CL 105 105  CO2 28 24  GLUCOSE 131* 141*  BUN 34* 21  CREATININE 0.91 0.75  CALCIUM 9.3 8.5*   LFT Recent Labs    03/26/22 0352  PROT 5.3*  ALBUMIN 2.6*  AST 17  ALT 20  ALKPHOS 48  BILITOT 0.5   PT/INR Recent Labs    03/25/22 1005  LABPROT 16.4*  INR 1.3*     Impression: 89-year-old lady nursing home resident chronically anticoagulated on Eliquis admitted to the hospital yesterday with hematemesis, progressive esophageal dysphagia, weight loss over the past 6 months. Anticoagulation reversed with Kcentra.  Emergency EGD revealed a large Zenker's diverticulum displacing the esophageal inlet laterally.  Large volume of adherent clot in the tubular esophagus ulceration poorly seen under the clot.   Tubular esophagus widely patent but esophageal inlet markedly compromised due to diverticulum.  Stomach and duodenum look good aside from a hiatal hernia.  Bleeding seems  to have ceased.   Recommendations:  Keep her intubated until after repeat EGD tomorrow.  No OG tube-discussed with nursing staff  Continue IV PPI infusion.  I have updated patient's daughter ([name] [phone]) this AM. I explained, Dr. Carver would be performing the repet EGD tomorrow.   Risk and benefits again reviewed.  Questions answered.  Daughter agreeable.   

## 2022-03-27 ENCOUNTER — Inpatient Hospital Stay (HOSPITAL_COMMUNITY): Payer: Medicare Other | Admitting: Certified Registered Nurse Anesthetist

## 2022-03-27 ENCOUNTER — Encounter (HOSPITAL_COMMUNITY)
Admission: EM | Disposition: A | Discharge status: Skilled Nursing Facility | Payer: Self-pay | Source: Skilled Nursing Facility | Attending: Internal Medicine

## 2022-03-27 ENCOUNTER — Telehealth: Payer: Self-pay | Admitting: Internal Medicine

## 2022-03-27 DIAGNOSIS — K920 Hematemesis: Secondary | ICD-10-CM | POA: Diagnosis not present

## 2022-03-27 DIAGNOSIS — K226 Gastro-esophageal laceration-hemorrhage syndrome: Secondary | ICD-10-CM

## 2022-03-27 DIAGNOSIS — K922 Gastrointestinal hemorrhage, unspecified: Secondary | ICD-10-CM | POA: Diagnosis not present

## 2022-03-27 DIAGNOSIS — J9601 Acute respiratory failure with hypoxia: Secondary | ICD-10-CM | POA: Diagnosis not present

## 2022-03-27 DIAGNOSIS — K208 Other esophagitis without bleeding: Secondary | ICD-10-CM

## 2022-03-27 DIAGNOSIS — K449 Diaphragmatic hernia without obstruction or gangrene: Secondary | ICD-10-CM

## 2022-03-27 DIAGNOSIS — D62 Acute posthemorrhagic anemia: Secondary | ICD-10-CM

## 2022-03-27 HISTORY — PX: HOT HEMOSTASIS: SHX5433

## 2022-03-27 HISTORY — PX: SUBMUCOSAL INJECTION: SHX5543

## 2022-03-27 HISTORY — PX: ESOPHAGOGASTRODUODENOSCOPY (EGD) WITH PROPOFOL: SHX5813

## 2022-03-27 LAB — CBC
HCT: 25.2 % — ABNORMAL LOW (ref 36.0–46.0)
Hemoglobin: 8.3 g/dL — ABNORMAL LOW (ref 12.0–15.0)
MCH: 30.4 pg (ref 26.0–34.0)
MCHC: 32.9 g/dL (ref 30.0–36.0)
MCV: 92.3 fL (ref 80.0–100.0)
Platelets: 231 10*3/uL (ref 150–400)
RBC: 2.73 MIL/uL — ABNORMAL LOW (ref 3.87–5.11)
RDW: 14.8 % (ref 11.5–15.5)
WBC: 7.3 10*3/uL (ref 4.0–10.5)
nRBC: 0 % (ref 0.0–0.2)

## 2022-03-27 LAB — COMPREHENSIVE METABOLIC PANEL
ALT: 16 U/L (ref 0–44)
AST: 12 U/L — ABNORMAL LOW (ref 15–41)
Albumin: 2.3 g/dL — ABNORMAL LOW (ref 3.5–5.0)
Alkaline Phosphatase: 44 U/L (ref 38–126)
Anion gap: 4 — ABNORMAL LOW (ref 5–15)
BUN: 19 mg/dL (ref 8–23)
CO2: 26 mmol/L (ref 22–32)
Calcium: 8.2 mg/dL — ABNORMAL LOW (ref 8.9–10.3)
Chloride: 106 mmol/L (ref 98–111)
Creatinine, Ser: 0.86 mg/dL (ref 0.44–1.00)
GFR, Estimated: >60 mL/min (ref >60)
Glucose, Bld: 105 mg/dL — ABNORMAL HIGH (ref 70–99)
Potassium: 3.7 mmol/L (ref 3.5–5.1)
Sodium: 136 mmol/L (ref 135–145)
Total Bilirubin: 0.4 mg/dL (ref 0.3–1.2)
Total Protein: 4.9 g/dL — ABNORMAL LOW (ref 6.5–8.1)

## 2022-03-27 LAB — MAGNESIUM: Magnesium: 1.8 mg/dL (ref 1.7–2.4)

## 2022-03-27 LAB — GLUCOSE, CAPILLARY
Glucose-Capillary: 107 mg/dL — ABNORMAL HIGH (ref 70–99)
Glucose-Capillary: 123 mg/dL — ABNORMAL HIGH (ref 70–99)

## 2022-03-27 SURGERY — ESOPHAGOGASTRODUODENOSCOPY (EGD) WITH PROPOFOL
Anesthesia: General

## 2022-03-27 MED ORDER — EPINEPHRINE 1 MG/10ML IJ SOSY
PREFILLED_SYRINGE | INTRAMUSCULAR | Status: AC
  Filled 2022-03-27: qty 10

## 2022-03-27 MED ORDER — PROPOFOL 10 MG/ML IV BOLUS
INTRAVENOUS | Status: DC | PRN
  Administered 2022-03-27 (×4): 20 mg via INTRAVENOUS

## 2022-03-27 MED ORDER — LACTATED RINGERS IV SOLN: INTRAVENOUS | Status: DC | PRN

## 2022-03-27 MED ORDER — SODIUM CHLORIDE (PF) 0.9 % IJ SOLN
PREFILLED_SYRINGE | INTRAMUSCULAR | Status: DC | PRN
  Administered 2022-03-27: 1 mL

## 2022-03-27 NOTE — Transfer of Care (Signed)
Immediate Anesthesia Transfer of Care Note  Patient: Kelsey Thompson  Procedure(s) Performed: ESOPHAGOGASTRODUODENOSCOPY (EGD) WITH PROPOFOL SUBMUCOSAL INJECTION HOT HEMOSTASIS (ARGON PLASMA COAGULATION/BICAP)  Patient Location: ICU  Anesthesia Type:General  Level of Consciousness: sedated and Patient remains intubated per anesthesia plan  Airway & Oxygen Therapy: Patient remains intubated per anesthesia plan and Patient placed on Ventilator (see vital sign flow sheet for setting)  Post-op Assessment: Report given to RN and Post -op Vital signs reviewed and stable  Post vital signs: Reviewed and stable  Last Vitals:  Vitals Value Taken Time  BP 149/62   Temp    Pulse 94   Resp    SpO2 100%     Last Pain:  Vitals:   03/27/22 1122  TempSrc:   PainSc: 0-No pain         Complications: No notable events documented.

## 2022-03-27 NOTE — Consult Note (Signed)
NAME:  Kelsey Thompson, MRN:  [id number], DOB:  14-Feb-1921, LOS: 2 ADMISSION DATE:  03/25/2022, CONSULTATION DATE:  03/27/22 REFERRING MD:  Sherryll Burger, [facility] , CHIEF COMPLAINT:  vent dep p EGD    History of Present Illness:  89 yobf quit smoking around 1980  with medical history significant for paroxysmal atrial fibrillation on anticoagulation with Eliquis, pacemaker placement, prior CVA, hypertension, dyslipidemia, GERD, and dementia who was brought to the ED for evaluation of hematemesis that began am 03/25/22 .  She was noted to have some blood on her pants as well as in the trash can prior to admit. She denied any significant abdominal pain, but continues to have some retching.  No fevers, chills, or diarrhea noted.  Patient is on a liquid diet at her facility and has had some weight loss recently.  Last dose of Eliquis noted to be this morning.  PCCM asked to see p second EGD to control bleeding      Significant Hospital Events: Including procedures, antibiotic start and stop dates in addition to other pertinent events   EGD  03/25/22  large Zenkers,Mod HH, es ulceration  Et 9/23 >>> Re EGD  03/27/12  Grad D erosive esophagitis/MW tear/ cauterized  and rec PPI infusion/hold eliquis  Scheduled Meds:  Chlorhexidine Gluconate Cloth  6 each Topical Daily   docusate  100 mg Per Tube BID   mouth rinse  15 mL Mouth Rinse Q2H   polyethylene glycol  17 g Per Tube Daily   Continuous Infusions:  dextrose 5% lactated ringers 50 mL/hr at 03/27/22 1109   pantoprazole 8 mg/hr (03/27/22 1157)   propofol (DIPRIVAN) infusion 45 mcg/kg/min (03/27/22 1109)   PRN Meds:.acetaminophen **OR** acetaminophen, hydrALAZINE, ondansetron **OR** ondansetron (ZOFRAN) IV, mouth rinse  Interim History / Subjective:  Back from EGD this am/ reinjectec   Objective   Blood pressure (!) 158/70, pulse (!) 57, temperature 98.3 F (36.8 C), resp. rate 15, height 5\' 3"  (1.6 m), weight 49.9 kg, SpO2 100 %.    Vent Mode:  PRVC FiO2 (%):  [28 %-35 %] 35 % Set Rate:  [12 bmp] 12 bmp Vt Set:  [360 mL] 360 mL PEEP:  [5 cmH20] 5 cmH20 Pressure Support:  [5 cmH20] 5 cmH20 Plateau Pressure:  [10 cmH20-16 cmH20] 12 cmH20   Intake/Output Summary (Last 24 hours) at 03/27/2022 1240 Last data filed at 03/27/2022 1202 Gross per 24 hour  Intake 1942.37 ml  Output 325 ml  Net 1617.37 ml   Filed Weights   03/25/22 1344 03/26/22 0433  Weight: 54.9 kg 49.9 kg    Examination: Tmax:  98.7 General appearance:    elderly intubated bf breathing just a few extra breaths over backup    No jvd Oropharynx et / no NG Neck supple Lungs with  distant BS bilaterally/ no air trapping on graphics RRR no s3 or or sign murmur Abd soft with nl  excursion  Extr warm with no edema or clubbing noted Neuro  Sensorium sedated on diproven,  no apparent motor deficits      Assessment & Plan:  1) Vent dep/ acute hypoxemic resp failure in pt with severe UGI bleeding/ esophagitis - s/p 2 egd's the second am 9/24 with cauterization of esosophagitis/mw tear prn   2) Acute blood loss anemia pm eliquis since admit   Lab Results  Component Value Date   HGB 8.3 (L) 03/27/2022   HGB 8.3 (L) 03/26/2022   HGB 7.9 (L) 03/25/2022   HGB 10.8 (  L) 04/07/2019   HGB 10.2 (L) 01/08/2019   Transfuse if < 8.0 to stay ahead of ongoing active bleeding  >>> rx per [facility]/ GI   3) protein calorie malnutrition  >>> rx per [facility]/ nutrition    Plan will be to wean to extubate amd 9/26   Best Practice (right click and "Reselect all SmartList Selections" daily)   Per [facility]   Labs   CBC: Recent Labs  Lab 03/25/22 1005 03/25/22 1728 03/26/22 0352 03/27/22 0421  WBC 5.6  --  9.1 7.3  HGB 9.4* 7.9* 8.3* 8.3*  HCT 28.7* 23.7* 24.4* 25.2*  MCV 93.5  --  89.7 92.3  PLT 260  --  257 231    Basic Metabolic Panel: Recent Labs  Lab 03/25/22 1005 03/26/22 0352 03/27/22 0421  NA 139 136 136  K 4.1 3.3* 3.7  CL 105 105 106  CO2 28 24 26    GLUCOSE 131* 141* 105*  BUN 34* 21 19  CREATININE 0.91 0.75 0.86  CALCIUM 9.3 8.5* 8.2*  MG  --  1.7 1.8   GFR: Estimated Creatinine Clearance: 28.1 mL/min (by C-G formula based on SCr of 0.86 mg/dL). Recent Labs  Lab 03/25/22 1005 03/26/22 0352 03/27/22 0421  WBC 5.6 9.1 7.3    Liver Function Tests: Recent Labs  Lab 03/25/22 1005 03/26/22 0352 03/27/22 0421  AST 20 17 12*  ALT 20 20 16   ALKPHOS 57 48 44  BILITOT 0.6 0.5 0.4  PROT 6.3* 5.3* 4.9*  ALBUMIN 3.1* 2.6* 2.3*   No results for input(s): "LIPASE", "AMYLASE" in the last 168 hours. No results for input(s): "AMMONIA" in the last 168 hours.  ABG    Component Value Date/Time   PHART 7.56 (H) 03/26/2022 0343   PCO2ART 30 (L) 03/26/2022 0343   PO2ART 144 (H) 03/26/2022 0343   HCO3 26.9 03/26/2022 0343   TCO2 23 07/02/2011 0416   O2SAT 98.7 03/26/2022 0343     Coagulation Profile: Recent Labs  Lab 03/25/22 1005  INR 1.3*    Cardiac Enzymes: No results for input(s): "CKTOTAL", "CKMB", "CKMBINDEX", "TROPONINI" in the last 168 hours.  HbA1C: Hgb A1c MFr Bld  Date/Time Value Ref Range Status  01/17/2019 05:39 AM 6.3 (H) 4.8 - 5.6 % Final    Comment:    (NOTE) Pre diabetes:          5.7%-6.4% Diabetes:              >6.4% Glycemic control for   <7.0% adults with diabetes     CBG: Recent Labs  Lab 03/26/22 0736 03/26/22 1119 03/26/22 2001 03/27/22 0031 03/27/22 0410  GLUCAP 126* 126* 141* 123* 107*      Past Medical History:  She,  has a past medical history of Arthritis, Bradycardia, GERD (gastroesophageal reflux disease), Hiatal hernia, Hypercholesterolemia, Hypertension, Pacemaker, and Stroke (HCC).   Surgical History:   Past Surgical History:  Procedure Laterality Date   APPENDECTOMY     CESAREAN SECTION     PACEMAKER INSERTION     TONSILLECTOMY       Social History:   reports that she quit smoking about 40 years ago. Her smoking use included cigarettes. She has never used  smokeless tobacco. She reports that she does not drink alcohol and does not use drugs.   Family History:  Her family history includes Diabetes in her father; Heart attack in her father; Heart failure in her mother.   Allergies Allergies  Allergen Reactions   Horse-Derived  Products Other (See Comments)    Tetanus:Reaction unknown   Penicillins Other (See Comments)    Did it involve swelling of the face/tongue/throat, SOB, or low BP? Yes-swelling Did it involve sudden or severe rash/hives, skin peeling, or any reaction on the inside of your mouth or nose? No Did you need to seek medical attention at a hospital or doctor's office? Yes-Dr. office When did it last happen?  4 years prior     If all above answers are "NO", may proceed with cephalosporin use.    Tetanus Antitoxin     Unknown reaction     Home Medications  Prior to Admission medications   Medication Sig Start Date End Date Taking? Authorizing Provider  amLODipine (NORVASC) 5 MG tablet Take 1 tablet (5 mg total) by mouth daily. 04/07/19  Yes Claretta Fraise, MD  apixaban (ELIQUIS) 2.5 MG TABS tablet Take 1 tablet (2.5 mg total) by mouth 2 (two) times daily. 04/07/19  Yes Stacks, Cletus Gash, MD  calcium-vitamin D (OSCAL WITH D) 500-200 MG-UNIT per tablet Take 1 tablet by mouth daily.   Yes [provider]  cholecalciferol (VITAMIN D3) 25 MCG (1000 UNIT) tablet Take 1,000 Units by mouth daily.   Yes [provider]  cyanocobalamin 1000 MCG tablet Take 1,000 mcg by mouth daily.   Yes [provider]  donepezil (ARICEPT) 5 MG tablet Take 1 tablet (5 mg total) by mouth at bedtime. 05/19/19  Yes Claretta Fraise, MD  enalapril (VASOTEC) 5 MG tablet Take 1 tablet (5 mg total) by mouth daily. 04/07/19  Yes Claretta Fraise, MD  ferrous sulfate 325 (65 FE) MG tablet Take 1 tablet (325 mg total) by mouth daily with breakfast. 04/07/19  Yes Claretta Fraise, MD  Melatonin 3 MG CAPS Take 1 capsule by mouth at bedtime.   Yes  [provider]  Oyster Shell 500 MG TABS Take 1 tablet by mouth daily.   Yes [provider]  Propylene Glycol (SYSTANE COMPLETE OP) Apply 1 drop to eye 4 (four) times daily.   Yes [provider]  QUEtiapine (SEROQUEL) 25 MG tablet Take 1 tablet (25 mg total) by mouth at bedtime. Patient taking differently: Take 12.5 mg by mouth at bedtime. 05/19/19  Yes Stacks, Cletus Gash, MD  rosuvastatin (CRESTOR) 5 MG tablet Take 5 mg by mouth daily. 03/21/22  Yes [provider]  sodium chloride 1 g tablet Take 1 g by mouth 2 (two) times daily.   Yes [provider]  vitamin C (ASCORBIC ACID) 500 MG tablet Take 500 mg by mouth daily.   Yes [provider]  acetaminophen (TYLENOL) 500 MG tablet Take 500 mg by mouth every 6 (six) hours as needed for mild pain or moderate pain.    [provider]

## 2022-03-27 NOTE — Anesthesia Preprocedure Evaluation (Signed)
Anesthesia Evaluation  Patient identified by MRN, date of birth, ID band Patient confused    Reviewed: Allergy & Precautions, H&P , NPO status , Patient's Chart, lab work & pertinent test results, reviewed documented beta blocker date and time , Unable to perform ROS - Chart review only  Airway Mallampati: II  TM Distance: >3 FB Neck ROM: full    Dental no notable dental hx.    Pulmonary neg pulmonary ROS, former smoker,    Pulmonary exam normal breath sounds clear to auscultation       Cardiovascular Exercise Tolerance: Good hypertension, + pacemaker  Rhythm:regular Rate:Normal     Neuro/Psych  Neuromuscular disease CVA, Residual Symptoms negative psych ROS   GI/Hepatic Neg liver ROS, hiatal hernia, GERD  ,  Endo/Other  negative endocrine ROS  Renal/GU negative Renal ROS  negative genitourinary   Musculoskeletal   Abdominal   Peds  Hematology negative hematology ROS (+)   Anesthesia Other Findings   Reproductive/Obstetrics negative OB ROS                             Anesthesia Physical  Anesthesia Plan  ASA: 4 and emergent  Anesthesia Plan: General and General ETT   Post-op Pain Management:    Induction: Inhalational  PONV Risk Score and Plan: Ondansetron  Airway Management Planned: Oral ETT  Additional Equipment:   Intra-op Plan:   Post-operative Plan: Possible Post-op intubation/ventilation  Informed Consent: I have reviewed the patients History and Physical, chart, labs and discussed the procedure including the risks, benefits and alternatives for the proposed anesthesia with the patient or authorized representative who has indicated his/her understanding and acceptance.    Suspend DNR.   Dental Advisory Given  Plan Discussed with: CRNA  Anesthesia Plan Comments: (No SUX - stroke with right hemiparesis Glidescope Rapid sequence intubation Extensive Pre-O2 Avoid  bag-mask  )        Anesthesia Quick Evaluation

## 2022-03-27 NOTE — Telephone Encounter (Signed)
  Pt's daughter calling, she said pt is in ICU and doesn't know when she will be home. She said pt wont be able to submit her transmission scheduled for tomorrow

## 2022-03-27 NOTE — Progress Notes (Signed)
PROGRESS NOTE    Kelsey Thompson  BDZ:[id number] DOB: 1920/08/13 DOA: 03/25/2022 PCP: Mechele Claude, MD   Brief Narrative:    Kelsey Thompson is a 88 y.o. female with medical history significant for paroxysmal atrial fibrillation on anticoagulation with Eliquis, pacemaker placement, prior CVA, hypertension, dyslipidemia, GERD, and dementia who was brought to the ED for evaluation of hematemesis that began earlier on 9/23 at her ALF.  She had her Eliquis reversed and underwent urgent EGD on 9/23 with findings of Zenker's diverticulum with large blood clot and signs of active bleeding.  She had Hemospray performed and has plans for repeat EGD by 9/25 and currently remains intubated on mechanical ventilation.  Assessment & Plan:   Principal Problem:   GI bleed Active Problems:   Essential hypertension   GERD   Cardiac pacemaker in situ   Embolic stroke (HCC) R brain s/p tPA d/t AF   Paroxysmal atrial fibrillation (HCC)   Hyperlipidemia LDL goal <70   Hemiparesis affecting left side as late effect of cerebrovascular accident (CVA) (HCC)   Hematemesis  Assessment and Plan:   Acute blood loss anemia secondary to upper GI bleed Status post EGD 9/23 with findings of large adherent clot and active bleeding with Zenker's diverticulum, plans to repeat EGD today Transfuse for hemoglobin less than 7, currently stable IV fluid, gentle with D5 PPI infusion on going Appreciate ongoing GI evaluation N.p.o. Holding home Eliquis with reversal on 9/23   PAF with PPM on chronic anticoagulation Holding Eliquis Continue telemetry monitoring   Prior CVA   Hypertension Hold antihypertensives IV hydralazine as needed for severe elevations   Dyslipidemia Hold statin   GERD IV PPI infusion as ordered   Dementia with behavioral disturbances Hold donepezil Hold Seroquel     DVT prophylaxis:SCDs Code Status: DNR Family Communication: Discussed with daughter on phone 9/24 Disposition  Plan:  Status is: Inpatient Remains inpatient appropriate because: Intubation/IV meds.   Consultants:  GI   Procedures:  EGD 9/23 Intubation 9/23   Antimicrobials:  None    Subjective: Patient seen and evaluated today and remains sedated and stable on the ventilator.  Plans for EGD today.  Objective: Vitals:   03/27/22 0700 03/27/22 0734 03/27/22 0758 03/27/22 0847  BP: (!) 158/70 (!) 158/70    Pulse: (!) 59 (!) 57    Resp: 16 15    Temp:  98.7 F (37.1 C) 98.3 F (36.8 C)   TempSrc:  Axillary    SpO2: 100% 100%    Weight:      Height:    5\' 3"  (1.6 m)    Intake/Output Summary (Last 24 hours) at 03/27/2022 0854 Last data filed at 03/27/2022 0500 Gross per 24 hour  Intake 1642.37 ml  Output 475 ml  Net 1167.37 ml   Filed Weights   03/25/22 1344 03/26/22 0433  Weight: 54.9 kg 49.9 kg    Examination:  General exam: Appears sedated on ventilator Respiratory system: Clear to auscultation. Respiratory effort normal.  Intubated. Cardiovascular system: S1 & S2 heard, RRR.  Gastrointestinal system: Abdomen is soft Central nervous system: Sedated Extremities: No edema Skin: No significant lesions noted Psychiatry: Flat affect.    Data Reviewed: I have personally reviewed following labs and imaging studies  CBC: Recent Labs  Lab 03/25/22 1005 03/25/22 1728 03/26/22 0352 03/27/22 0421  WBC 5.6  --  9.1 7.3  HGB 9.4* 7.9* 8.3* 8.3*  HCT 28.7* 23.7* 24.4* 25.2*  MCV 93.5  --  89.7 92.3  PLT 260  --  257 294   Basic Metabolic Panel: Recent Labs  Lab 03/25/22 1005 03/26/22 0352 03/27/22 0421  NA 139 136 136  K 4.1 3.3* 3.7  CL 105 105 106  CO2 28 24 26   GLUCOSE 131* 141* 105*  BUN 34* 21 19  CREATININE 0.91 0.75 0.86  CALCIUM 9.3 8.5* 8.2*  MG  --  1.7 1.8   GFR: Estimated Creatinine Clearance: 28.1 mL/min (by C-G formula based on SCr of 0.86 mg/dL). Liver Function Tests: Recent Labs  Lab 03/25/22 1005 03/26/22 0352 03/27/22 0421  AST 20  17 12*  ALT 20 20 16   ALKPHOS 57 48 44  BILITOT 0.6 0.5 0.4  PROT 6.3* 5.3* 4.9*  ALBUMIN 3.1* 2.6* 2.3*   No results for input(s): "LIPASE", "AMYLASE" in the last 168 hours. No results for input(s): "AMMONIA" in the last 168 hours. Coagulation Profile: Recent Labs  Lab 03/25/22 1005  INR 1.3*   Cardiac Enzymes: No results for input(s): "CKTOTAL", "CKMB", "CKMBINDEX", "TROPONINI" in the last 168 hours. BNP (last 3 results) No results for input(s): "PROBNP" in the last 8760 hours. HbA1C: No results for input(s): "HGBA1C" in the last 72 hours. CBG: Recent Labs  Lab 03/26/22 0736 03/26/22 1119 03/26/22 2001 03/27/22 0031 03/27/22 0410  GLUCAP 126* 126* 141* 123* 107*   Lipid Profile: Recent Labs    03/26/22 0352  TRIG 32   Thyroid Function Tests: No results for input(s): "TSH", "T4TOTAL", "FREET4", "T3FREE", "THYROIDAB" in the last 72 hours. Anemia Panel: No results for input(s): "VITAMINB12", "FOLATE", "FERRITIN", "TIBC", "IRON", "RETICCTPCT" in the last 72 hours. Sepsis Labs: No results for input(s): "PROCALCITON", "LATICACIDVEN" in the last 168 hours.  No results found for this or any previous visit (from the past 240 hour(s)).       Radiology Studies: DG Chest Port 1 View  Result Date: 03/25/2022 CLINICAL DATA:  Fall and altered mental status. EXAM: PORTABLE CHEST 1 VIEW COMPARISON:  10/12/2020 and prior radiographs FINDINGS: Cardiomediastinal silhouette and LEFT-sided pacemaker are unchanged. There is no evidence of focal airspace disease, pulmonary edema, suspicious pulmonary nodule/mass, pleural effusion, or pneumothorax. No acute bony abnormalities are identified. IMPRESSION: No active disease. Electronically Signed   By: Margarette Canada M.D.   On: 03/25/2022 10:46        Scheduled Meds:  Chlorhexidine Gluconate Cloth  6 each Topical Daily   docusate  100 mg Per Tube BID   mouth rinse  15 mL Mouth Rinse Q2H   polyethylene glycol  17 g Per Tube Daily    Continuous Infusions:  dextrose 5% lactated ringers 50 mL/hr at 03/27/22 0811   pantoprazole 8 mg/hr (03/25/22 1047)   propofol (DIPRIVAN) infusion 45 mcg/kg/min (03/27/22 0808)     LOS: 2 days    Time spent: 35 minutes    Deneise Getty Darleen Crocker, DO [facility]  If 7PM-7AM, please contact night-coverage [email or web address] 03/27/2022, 8:54 AM

## 2022-03-27 NOTE — Op Note (Signed)
[facility] Patient Name: Kelsey Thompson Procedure Date: 03/27/2022 11:06 AM MRN: [id number] Date of Birth: 06/11/1921 Attending MD: Hennie Duos. Marletta Lor DO CSN: [id number] Age: 88 Admit Type: Outpatient Procedure:                Upper GI endoscopy Indications:              Acute post hemorrhagic anemia, Hematemesis Providers:                Hennie Duos. Marletta Lor, DO, Angelica Ran, Lennice Sites                            Technician, Technician, Dyann Ruddle Referring MD:              Medicines:                See the Anesthesia note for documentation of the                            administered medications Complications:            No immediate complications. Estimated Blood Loss:     Estimated blood loss was minimal. Procedure:                Pre-Anesthesia Assessment:                           - The anesthesia plan was to use general anesthesia.                           After obtaining informed consent, the endoscope was                            passed under direct vision. Throughout the                            procedure, the patient's blood pressure, pulse, and                            oxygen saturations were monitored continuously. The                            GIF-H190 ([id number]) scope was introduced through the                            mouth, and advanced to the second part of duodenum.                            The upper GI endoscopy was accomplished without                            difficulty. The patient tolerated the procedure                            well. Scope In: 11:25:01 AM Scope Out: 11:36:22 AM Total Procedure Duration: 0 hours 11 minutes 21 seconds  Findings:      LA Grade D (one or  more mucosal breaks involving at least 75% of       esophageal circumference) esophagitis was found in the lower third of       the esophagus.      A bleeding Mallory-Weiss tear with stigmata of recent bleeding was found       in distal esophagus. Area was successfully  injected with 2 mL of a       1:10,000 solution of epinephrine for hemostasis. Coagulation for       hemostasis using bipolar probe was successful.      Hematin (altered blood/coffee-ground-like material) was found in the       entire examined stomach.      The duodenal bulb, first portion of the duodenum and second portion of       the duodenum were normal.      A non-bleeding Zenker's diverticulum with a large opening and no       impacted food was found. Impression:               - LA Grade D erosive esophagitis.                           - Mallory-Weiss tear. Injected. Treated with                            bipolar cautery.                           - Hematin (altered blood/coffee-ground-like                            material) in the entire stomach.                           - Normal duodenal bulb, first portion of the                            duodenum and second portion of the duodenum.                           - Zenker's diverticulum.                           - No specimens collected. Moderate Sedation:      Per Anesthesia Care Recommendation:           - Return patient to ICU for ongoing care.                           - Remain intubated today. Possible extubation                            tomorrow.                           - Needs IV PPI infusion x72 hours                           - Continue to hold Eliquis Procedure Code(s):        ---  Professional ---                           [phone], Esophagogastroduodenoscopy, flexible,                            transoral; with control of bleeding, any method Diagnosis Code(s):        --- Professional ---                           K20.80, Other esophagitis without bleeding                           K22.6, Gastro-esophageal laceration-hemorrhage                            syndrome                           K92.2, Gastrointestinal hemorrhage, unspecified                           D62, Acute posthemorrhagic anemia                            K92.0, Hematemesis CPT copyright 2019 [employer]. All rights reserved. The codes documented in this report are preliminary and upon coder review may  be revised to meet current compliance requirements. Hennie Duos. Marletta Lor, DO Hennie Duos. Marletta Lor, DO 03/27/2022 11:50:40 AM This report has been signed electronically. Number of Addenda: 0

## 2022-03-27 NOTE — TOC Initial Note (Signed)
Transition of Care [facility]) - Initial/Assessment Note    Patient Details  Name: Kelsey Thompson MRN: [id number] Date of Birth: 1921/03/23  Transition of Care [facility]) CM/SW Contact:    Salome Arnt, [place] Phone Number: 03/27/2022, 2:27 PM  Clinical Narrative: Pt admitted due to GI bleed. LCSW completed assessment with pt's daughter, [name] as pt currently intubated. Pt has been a resident at [facility] for almost 3 years. Pt's daughter is hopeful pt can return there when medically stable. LCSW left voicemail for administrator at facility requesting return call. TOC will continue to follow and address d/c needs when appropriate.                  Expected Discharge Plan: Assisted Living Barriers to Discharge: Continued Medical Work up   Patient Goals and CMS Choice Patient states their goals for this hospitalization and ongoing recovery are:: hopeful for return to ALF   Choice offered to / list presented to : Adult Children  Expected Discharge Plan and Services Expected Discharge Plan: Assisted Living In-house Referral: Clinical Social Work     Living arrangements for the past 2 months: [place]                                      Prior Living Arrangements/Services Living arrangements for the past 2 months: [place] Lives with:: Facility Resident Patient language and need for interpreter reviewed:: Yes Do you feel safe going back to the place where you live?: Yes      Need for Family Participation in Patient Care: Yes (Comment) Care giver support system in place?: Yes (comment)   Criminal Activity/Legal Involvement Pertinent to Current Situation/Hospitalization: No - Comment as needed  Activities of Daily Living      Permission Sought/Granted   Permission granted to share information with : Yes, Verbal Permission Granted     Permission granted to share info w AGENCY: [facility]  Permission granted to  share info w Relationship: ALF     Emotional Assessment   Attitude/Demeanor/Rapport: Unable to Assess Affect (typically observed): Unable to Assess   Alcohol / Substance Use: Not Applicable Psych Involvement: No (comment)  Admission diagnosis:  Melena [K92.1] GI bleed [K92.2] Gastrointestinal hemorrhage, unspecified gastrointestinal hemorrhage type [K92.2] Hematemesis with nausea [K92.0] Hematemesis [K92.0] Patient Active Problem List   Diagnosis Date Noted   Acute hypoxemic respiratory failure ([place]) 03/27/2022   Hematemesis 03/26/2022   GI bleed 03/25/2022   Altered behavior 10/20/2020   Hemiparesis affecting left side as late effect of cerebrovascular accident (CVA) ([place]) 05/19/2019   Paroxysmal atrial fibrillation ([place]) 01/21/2019   Hyperlipidemia LDL goal <70 01/75/1025   Embolic stroke ([place]) R brain s/p tPA d/t AF 01/16/2019   Acute pain of left knee 07/17/2014   Sinoatrial node dysfunction ([place]) 09/26/2011   Cardiac pacemaker in situ 11/19/2008   Essential hypertension 11/07/2008   BRADYCARDIA 11/07/2008   GERD 11/07/2008   Diaphragmatic hernia 11/07/2008   PCP:  Claretta Fraise, MD Pharmacy:   [facility], [place] [address] Phone: [phone] Fax: [facility] [address], [place] [address] [address] [place] [address] Phone: [phone] Fax: [phone]     Social Determinants of Health (SDOH) Interventions    Readmission Risk Interventions     No  data to display           

## 2022-03-27 NOTE — Interval H&P Note (Signed)
History and Physical Interval Note:  03/27/2022 11:04 AM  Kelsey Thompson  has presented today for surgery, with the diagnosis of Upper GI bleed.  The various methods of treatment have been discussed with the patient and family. After consideration of risks, benefits and other options for treatment, the patient has consented to  Procedure(s): ESOPHAGOGASTRODUODENOSCOPY (EGD) WITH PROPOFOL (N/A) as a surgical intervention.  The patient's history has been reviewed, patient examined, no change in status, stable for surgery.  I have reviewed the patient's chart and labs.  Questions were answered to the patient's satisfaction.     Eloise Harman

## 2022-03-27 NOTE — Anesthesia Postprocedure Evaluation (Signed)
Anesthesia Post Note  Patient: Kelsey Thompson  Procedure(s) Performed: ESOPHAGOGASTRODUODENOSCOPY (EGD) WITH PROPOFOL SUBMUCOSAL INJECTION HOT HEMOSTASIS (ARGON PLASMA COAGULATION/BICAP)  Patient location during evaluation: Phase II Anesthesia Type: General Level of consciousness: awake Pain management: pain level controlled Vital Signs Assessment: post-procedure vital signs reviewed and stable Respiratory status: spontaneous breathing and respiratory function stable Cardiovascular status: blood pressure returned to baseline and stable Postop Assessment: no headache and no apparent nausea or vomiting Anesthetic complications: no Comments: Late entry   No notable events documented.   Last Vitals:  Vitals:   03/27/22 0758 03/27/22 1200  BP:    Pulse:    Resp:    Temp: 36.8 C   SpO2:  100%    Last Pain:  Vitals:   03/27/22 1122  TempSrc:   PainSc: 0-No pain                 Louann Sjogren

## 2022-03-27 NOTE — Telephone Encounter (Signed)
Daughter called stating the patient is in the ICU at [facility] and will not be able to transmit her pacemaker reading.  Daughter would like a call back regarding sending later transmissions.

## 2022-03-28 ENCOUNTER — Inpatient Hospital Stay (HOSPITAL_COMMUNITY): Payer: Medicare Other

## 2022-03-28 DIAGNOSIS — K2100 Gastro-esophageal reflux disease with esophagitis, without bleeding: Secondary | ICD-10-CM | POA: Diagnosis not present

## 2022-03-28 DIAGNOSIS — R1310 Dysphagia, unspecified: Secondary | ICD-10-CM | POA: Diagnosis not present

## 2022-03-28 DIAGNOSIS — K922 Gastrointestinal hemorrhage, unspecified: Secondary | ICD-10-CM | POA: Diagnosis not present

## 2022-03-28 DIAGNOSIS — J9601 Acute respiratory failure with hypoxia: Secondary | ICD-10-CM | POA: Diagnosis not present

## 2022-03-28 LAB — CBC
HCT: 26.4 % — ABNORMAL LOW (ref 36.0–46.0)
Hemoglobin: 8.6 g/dL — ABNORMAL LOW (ref 12.0–15.0)
MCH: 30.1 pg (ref 26.0–34.0)
MCHC: 32.6 g/dL (ref 30.0–36.0)
MCV: 92.3 fL (ref 80.0–100.0)
Platelets: 227 10*3/uL (ref 150–400)
RBC: 2.86 MIL/uL — ABNORMAL LOW (ref 3.87–5.11)
RDW: 14.6 % (ref 11.5–15.5)
WBC: 7.1 10*3/uL (ref 4.0–10.5)
nRBC: 0 % (ref 0.0–0.2)

## 2022-03-28 LAB — MAGNESIUM: Magnesium: 1.7 mg/dL (ref 1.7–2.4)

## 2022-03-28 LAB — BASIC METABOLIC PANEL
Anion gap: 4 — ABNORMAL LOW (ref 5–15)
BUN: 13 mg/dL (ref 8–23)
CO2: 26 mmol/L (ref 22–32)
Calcium: 8.1 mg/dL — ABNORMAL LOW (ref 8.9–10.3)
Chloride: 107 mmol/L (ref 98–111)
Creatinine, Ser: 0.71 mg/dL (ref 0.44–1.00)
GFR, Estimated: >60 mL/min (ref >60)
Glucose, Bld: 121 mg/dL — ABNORMAL HIGH (ref 70–99)
Potassium: 3.5 mmol/L (ref 3.5–5.1)
Sodium: 137 mmol/L (ref 135–145)

## 2022-03-28 MED ORDER — PANTOPRAZOLE SODIUM 40 MG IV SOLR
40.0000 mg | Freq: Two times a day (BID) | INTRAVENOUS | Status: DC
  Administered 2022-03-31 – 2022-04-02 (×5): 40 mg via INTRAVENOUS
  Filled 2022-03-28 (×5): qty 10

## 2022-03-28 MED ORDER — ORAL CARE MOUTH RINSE
15.0000 mL | OROMUCOSAL | Status: DC
  Administered 2022-03-28 – 2022-04-03 (×23): 15 mL via OROMUCOSAL

## 2022-03-28 MED ORDER — ORAL CARE MOUTH RINSE: 15.0000 mL | OROMUCOSAL | Status: DC | PRN

## 2022-03-28 MED ORDER — PANTOPRAZOLE INFUSION (NEW) - SIMPLE MED
8.0000 mg/h | INTRAVENOUS | Status: AC
  Administered 2022-03-28 – 2022-03-30 (×5): 8 mg/h via INTRAVENOUS
  Filled 2022-03-28: qty 80
  Filled 2022-03-28: qty 100
  Filled 2022-03-28 (×3): qty 80

## 2022-03-28 NOTE — Progress Notes (Signed)
Subjective: Remains intubated. Per nursing, they are weaning off sedation medications and hoping for extubation today. No brbpr or melena. Daughter at bedside and reiterates prior reports of progressive dysphagia requiring down grading of diet outpatient over the last several months to a puree diet. Reports she has chronic history of reflux and was on omeprazole daily.   Daughter also stated she would not want her mother to have a feeding tube if it were to come down to that.   Objective: Vital signs in last 24 hours: Temp:  [97.6 F (36.4 C)-98.6 F (37 C)] 98.6 F (37 C) (09/26 0800) Pulse Rate:  [34-89] 59 (09/26 0800) Resp:  [12-17] 13 (09/26 0800) BP: (134-174)/(45-85) 146/63 (09/26 0800) SpO2:  [99 %-100 %] 100 % (09/26 0800) FiO2 (%):  [35 %] 35 % (09/26 0800) Weight:  [54.8 kg] 54.8 kg (09/26 0019)   General:   Elderly lady, sedated on a ventilator.  Head:  Normocephalic and atraumatic. Abdomen:  Bowel sounds hypoactive, soft, non-tender, non-distended. No HSM or hernias noted. No masses appreciated  Msk:  Symmetrical without gross deformities.  Extremities:  Without edema.  Intake/Output from previous day: 09/25 0701 - 09/26 0700 In: 1531.2 [P.O.:98.7; I.V.:1432.5] Out: 550 [Urine:550] Intake/Output this shift: No intake/output data recorded.  Lab Results: Recent Labs    03/26/22 0352 03/27/22 0421 03/28/22 0401  WBC 9.1 7.3 7.1  HGB 8.3* 8.3* 8.6*  HCT 24.4* 25.2* 26.4*  PLT 257 231 227   BMET Recent Labs    03/26/22 0352 03/27/22 0421 03/28/22 0401  NA 136 136 137  K 3.3* 3.7 3.5  CL 105 106 107  CO2 24 26 26   GLUCOSE 141* 105* 121*  BUN 21 19 13   CREATININE 0.75 0.86 0.71  CALCIUM 8.5* 8.2* 8.1*   LFT Recent Labs    03/25/22 1005 03/26/22 0352 03/27/22 0421  PROT 6.3* 5.3* 4.9*  ALBUMIN 3.1* 2.6* 2.3*  AST 20 17 12*  ALT 20 20 16   ALKPHOS 57 48 44  BILITOT 0.6 0.5 0.4   PT/INR Recent Labs    03/25/22 1005  LABPROT 16.4*  INR  1.3*    Studies/Results: DG Chest Port 1 View  Result Date: 03/28/2022 CLINICAL DATA:  Respiratory failure EXAM: PORTABLE CHEST 1 VIEW COMPARISON:  03/25/2022 FINDINGS: Interval endotracheal intubation, tube tip over the midtrachea. Cardiomegaly with left chest multi lead pacer. New small bilateral pleural effusions. Lungs are normally aerated. IMPRESSION: 1. Interval endotracheal intubation, tube tip over the midtrachea. 2. New small bilateral pleural effusions. 3. Cardiomegaly. Electronically Signed   By: Delanna Ahmadi M.D.   On: 03/28/2022 09:17    Assessment:  88 year old lady with chronic atrial fibrillation on Eliquis brought to the ED by EMS 9/23 from the nursing home with hematemesis, progressive solid food dysphagia, and reported weight loss over the last year per daughter.  Hemoglobin was 9.4, down from 12.8 previously, Hemoccult positive, but overall remained hemodynamically stable.  Progressive dysphagia/hematemesis/acute blood loss anemia: - EGD 9/23 with large double barrel Zenker's diverticulum markedly displacing the esophageal lumen, active bleeding with large amount of clot in the esophagus.  Incomplete examination; however, esophageal ulceration was seen.  Hemospray deployed.  Moderate size hiatal hernia.  Grossly normal gastric and duodenal mucosa.  - PPI infusion initiated after first EGD.  - Repeat EGD 9/25 with Zenker's diverticulum, grade D esophagitis in the lower third of the esophagus, bleeding Mallory-Weiss tear with stigmata of recent bleeding in the distal esophagus injected with  epinephrine and treated with bipolar cautery.  Hematin in the entire stomach. - Remains intubated this morning, but planning to wean sedation and extubate today if possible.  - Hgb declined as low as 7.9 on 9/23. She has not required PRBCs and hemoglobin is stable/improved today at 8.6. She has had no signs of overt bleeding.  - Looks like PPI infusion was discontinued after this morning, but  needs to be continued for 72 hrs after last endoscopy as she had active bleeding. I will resume this.  - Once extubated, she will need swallow evaluation to ensure she can swallow safely. If she passes swallow test, would recommend trial of clear liquids today and can consider advancing to full liquids tomorrow. Remains to be seen if dysphagia was primarily driven by severe esophagitis vs Zenker's diverticulum. Non-the less, patient would not be a candidate for surgical management of Zenker's and daughter states she wouldn't want her mother to have surgery regardless or have a feeding tube placed.    Plan: Continue IV PPI infusion until 9/28 (72 hours after last EGD). Then transition to PPI BID.   Plans for extubation today. Will need mini swallow evaluation by nursing staff to ensure she can swallow safely, then would consider trial of clear liquids today.  Continue to monitor H/H and for overt GI bleeding.  Can consider repeat EGD with dilation as appropriate in about 4 weeks.    LOS: 3 days    03/28/2022, 9:48 AM   Ermalinda Memos, PA-C [facility]

## 2022-03-28 NOTE — Telephone Encounter (Signed)
Patient daughter called back to let us know that her mom is still in ICU and wont be able to transmit today

## 2022-03-28 NOTE — Procedures (Signed)
Extubation Procedure Note  Patient Details:   Name: CELEST REITZ DOB: 1921-01-25 MRN: [id number]   Airway Documentation:    Vent end date: 03/28/22 Vent end time: 1300   Evaluation  O2 sats: stable throughout Complications: No apparent complications Patient did not tolerate procedure well. Bilateral Breath Sounds: Clear, Diminished   Yes patient did not speak but coughing and breathing fine.  Tolerated well. Placed on 3 L [place]  Ned Grace 03/28/2022, 1:04 PM

## 2022-03-28 NOTE — Progress Notes (Signed)
PROGRESS NOTE    Kelsey Thompson  WER:[id number] DOB: 1921/05/08 DOA: 03/25/2022 PCP: Mechele Claude, MD   Brief Narrative:    Kelsey Thompson is a 88 y.o. female with medical history significant for paroxysmal atrial fibrillation on anticoagulation with Eliquis, pacemaker placement, prior CVA, hypertension, dyslipidemia, GERD, and dementia who was brought to the ED for evaluation of hematemesis that began earlier on 9/23 at her ALF.  She had her Eliquis reversed and underwent urgent EGD on 9/23 with findings of Zenker's diverticulum with large blood clot and signs of active bleeding.  She had Hemospray performed and has plans for repeat EGD by 9/25 and currently remains intubated on mechanical ventilation.  Assessment & Plan:   Principal Problem:   GI bleed Active Problems:   Essential hypertension   GERD   Cardiac pacemaker in situ   Embolic stroke (HCC) R brain s/p tPA d/t AF   Paroxysmal atrial fibrillation ([facility])   Hyperlipidemia LDL goal <70   Hemiparesis affecting left side as late effect of cerebrovascular accident (CVA) (H[facility])   Hematemesis   Acute hypoxemic respiratory failure ([facility])  Assessment and Plan:   Acute blood loss anemia secondary to upper GI bleed Status post EGD 9/23 with findings of large adherent clot and active bleeding with Zenker's diverticulum, plans to repeat EGD today Transfuse for hemoglobin less than 7, currently stable IV fluid, gentle with D5 PPI infusion on going Appreciate ongoing GI evaluation N.p.o. for now Holding home Eliquis with reversal on 9/23 Planning for extubation today, dietary advancement per GI Hemoglobin and hematocrit currently stable   PAF with PPM on chronic anticoagulation Holding Eliquis Continue telemetry monitoring   Prior CVA   Hypertension Hold antihypertensives IV hydralazine as needed for severe elevations   Dyslipidemia Hold statin   GERD IV PPI infusion as ordered   Dementia with behavioral  disturbances Hold donepezil Hold Seroquel     DVT prophylaxis:SCDs Code Status: DNR Family Communication: Discussed with daughter on phone 9/25 Disposition Plan:  Status is: Inpatient Remains inpatient appropriate because: Intubation/IV meds.   Consultants:  GI Pulmonology   Procedures:  EGD 9/23 Intubation 9/23   Antimicrobials:  None    Subjective: Patient seen and evaluated today with no new acute complaints or concerns. No acute concerns or events noted overnight.  Objective: Vitals:   03/28/22 0800 03/28/22 0830 03/28/22 1125 03/28/22 1200  BP: (!) 146/63  (!) 157/95   Pulse: (!) 59  61   Resp: 13  17   Temp: 98.6 F (37 C)   98.9 F (37.2 C)  TempSrc: Axillary   Axillary  SpO2: 100% 100% 100%   Weight:      Height:        Intake/Output Summary (Last 24 hours) at 03/28/2022 1235 Last data filed at 03/28/2022 0357 Gross per 24 hour  Intake 1231.23 ml  Output 300 ml  Net 931.23 ml   Filed Weights   03/25/22 1344 03/26/22 0433 03/28/22 0019  Weight: 54.9 kg 49.9 kg 54.8 kg    Examination:  General exam: Appears sedated Respiratory system: Clear to auscultation. Respiratory effort normal.  Intubated Cardiovascular system: S1 & S2 heard, RRR.  Gastrointestinal system: Abdomen is soft Central nervous system: Sedated Extremities: No edema Skin: No significant lesions noted Psychiatry: Flat affect.    Data Reviewed: I have personally reviewed following labs and imaging studies  CBC: Recent Labs  Lab 03/25/22 1005 03/25/22 1728 03/26/22 0352 03/27/22 0421 03/28/22 0401  WBC 5.6  --  9.1 7.3 7.1  HGB 9.4* 7.9* 8.3* 8.3* 8.6*  HCT 28.7* 23.7* 24.4* 25.2* 26.4*  MCV 93.5  --  89.7 92.3 92.3  PLT 260  --  257 231 696   Basic Metabolic Panel: Recent Labs  Lab 03/25/22 1005 03/26/22 0352 03/27/22 0421 03/28/22 0401  NA 139 136 136 137  K 4.1 3.3* 3.7 3.5  CL 105 105 106 107  CO2 28 24 26 26   GLUCOSE 131* 141* 105* 121*  BUN 34* 21 19  13   CREATININE 0.91 0.75 0.86 0.71  CALCIUM 9.3 8.5* 8.2* 8.1*  MG  --  1.7 1.8 1.7   GFR: Estimated Creatinine Clearance: 31.7 mL/min (by C-G formula based on SCr of 0.71 mg/dL). Liver Function Tests: Recent Labs  Lab 03/25/22 1005 03/26/22 0352 03/27/22 0421  AST 20 17 12*  ALT 20 20 16   ALKPHOS 57 48 44  BILITOT 0.6 0.5 0.4  PROT 6.3* 5.3* 4.9*  ALBUMIN 3.1* 2.6* 2.3*   No results for input(s): "LIPASE", "AMYLASE" in the last 168 hours. No results for input(s): "AMMONIA" in the last 168 hours. Coagulation Profile: Recent Labs  Lab 03/25/22 1005  INR 1.3*   Cardiac Enzymes: No results for input(s): "CKTOTAL", "CKMB", "CKMBINDEX", "TROPONINI" in the last 168 hours. BNP (last 3 results) No results for input(s): "PROBNP" in the last 8760 hours. HbA1C: No results for input(s): "HGBA1C" in the last 72 hours. CBG: Recent Labs  Lab 03/26/22 0736 03/26/22 1119 03/26/22 2001 03/27/22 0031 03/27/22 0410  GLUCAP 126* 126* 141* 123* 107*   Lipid Profile: Recent Labs    03/26/22 0352  TRIG 32   Thyroid Function Tests: No results for input(s): "TSH", "T4TOTAL", "FREET4", "T3FREE", "THYROIDAB" in the last 72 hours. Anemia Panel: No results for input(s): "VITAMINB12", "FOLATE", "FERRITIN", "TIBC", "IRON", "RETICCTPCT" in the last 72 hours. Sepsis Labs: No results for input(s): "PROCALCITON", "LATICACIDVEN" in the last 168 hours.  No results found for this or any previous visit (from the past 240 hour(s)).       Radiology Studies: DG Chest Port 1 View  Result Date: 03/28/2022 CLINICAL DATA:  Respiratory failure EXAM: PORTABLE CHEST 1 VIEW COMPARISON:  03/25/2022 FINDINGS: Interval endotracheal intubation, tube tip over the midtrachea. Cardiomegaly with left chest multi lead pacer. New small bilateral pleural effusions. Lungs are normally aerated. IMPRESSION: 1. Interval endotracheal intubation, tube tip over the midtrachea. 2. New small bilateral pleural effusions.  3. Cardiomegaly. Electronically Signed   By: Delanna Ahmadi M.D.   On: 03/28/2022 09:17        Scheduled Meds:  Chlorhexidine Gluconate Cloth  6 each Topical Daily   docusate  100 mg Per Tube BID   mouth rinse  15 mL Mouth Rinse Q2H   polyethylene glycol  17 g Per Tube Daily   Continuous Infusions:  dextrose 5% lactated ringers 50 mL/hr at 03/28/22 0344   pantoprazole     propofol (DIPRIVAN) infusion Stopped (03/28/22 1104)     LOS: 3 days    Time spent: 35 minutes    Shaquila Sigman Darleen Crocker, DO [facility]  If 7PM-7AM, please contact night-coverage [email or web address] 03/28/2022, 12:35 PM

## 2022-03-28 NOTE — Progress Notes (Signed)
NAME:  Kelsey Thompson, MRN:  [id number], DOB:  [birth year], LOS: 3 ADMISSION DATE:  03/25/2022, CONSULTATION DATE:  03/27/22 REFERRING MD:  Sherryll Burger, [facility] , CHIEF COMPLAINT:  vent dep p EGD    History of Present Illness:  89 yobf quit smoking around 1980  with medical history significant for paroxysmal atrial fibrillation on anticoagulation with Eliquis, pacemaker placement, prior CVA, hypertension, dyslipidemia, GERD, and dementia who was brought to the ED for evaluation of hematemesis that began am 03/25/22 .  She was noted to have some blood on her pants as well as in the trash can prior to admit. She denied any significant abdominal pain, but continues to have some retching.  No fevers, chills, or diarrhea noted.  Patient is on a liquid diet at her facility and has had some weight loss recently.  Last dose of Eliquis noted to be this morning.  PCCM asked to see re vent management  p second EGD 9/25 done  to control bleeding      Significant Hospital Events: Including procedures, antibiotic start and stop dates in addition to other pertinent events   EGD  9/23   large Zenkers,Mod HH, es ulceration  Et 9/23 >>> 9/26  Re EGD  9/25  Grad D erosive esophagitis/MW tear/ cauterized  and rec PPI infusion/hold eliquis  Scheduled Meds:  Chlorhexidine Gluconate Cloth  6 each Topical Daily   docusate  100 mg Per Tube BID   mouth rinse  15 mL Mouth Rinse Q2H   [START ON 03/31/2022] pantoprazole (PROTONIX) IV  40 mg Intravenous Q12H   polyethylene glycol  17 g Per Tube Daily   Continuous Infusions:  dextrose 5% lactated ringers 50 mL/hr at 03/28/22 0344   pantoprazole 8 mg/hr (03/28/22 1318)   propofol (DIPRIVAN) infusion Stopped (03/28/22 1104)   PRN Meds:.acetaminophen **OR** acetaminophen, hydrALAZINE, ondansetron **OR** ondansetron (ZOFRAN) IV, mouth rinse   Interim History / Subjective:  Tolerated PSV 5/5 off sedation > extubated 1pm tol well though cough effort weak  Objective   Blood  pressure (!) 157/95, pulse 61, temperature 98.9 F (37.2 C), temperature source Axillary, resp. rate 17, height 5\' 3"  (1.6 m), weight 54.8 kg, SpO2 96 %.    Vent Mode: PSV;CPAP FiO2 (%):  [35 %] 35 % Set Rate:  [12 bmp] 12 bmp Vt Set:  [360 mL] 360 mL PEEP:  [5 cmH20] 5 cmH20 Pressure Support:  [5 cmH20] 5 cmH20 Plateau Pressure:  [15 cmH20-16 cmH20] 15 cmH20   Intake/Output Summary (Last 24 hours) at 03/28/2022 1338 Last data filed at 03/28/2022 0357 Gross per 24 hour  Intake 462.45 ml  Output 300 ml  Net 162.45 ml   Filed Weights   03/25/22 1344 03/26/22 0433 03/28/22 0019  Weight: 54.9 kg 49.9 kg 54.8 kg    Examination:  Tmax:  98.7 General appearance:    elderly wf no increased wob p extubatoin    No jvd Oropharynx clear  Neck supple Lungs with a few very distant scattered exp > insp rhonchi bilaterally RRR no s3 or or sign murmur Abd soft ith nl  excursion  Extr warm with no edema or clubbing noted Neuro  Sensorium sedated still,  no apparent motor deficits     I personally reviewed images and agree with radiology impression as follows:  CXR:   portable 9/26 1. Interval endotracheal intubation, tube tip over the midtrachea. 2. New small bilateral pleural effusions. 3. Cardiomegaly.  Assessment & Plan:  1) Vent dep/ acute hypoxemic  resp failure in pt with severe UGI bleeding/ esophagitis - s/p 2 egd's the second am 9/24 with cauterization of esosophagitis/mw tear prn  >>>extubated to NP today at 1pm   2) Acute blood loss anemia pm eliquis since admit   Lab Results  Component Value Date   HGB 8.6 (L) 03/28/2022   HGB 8.3 (L) 03/27/2022   HGB 8.3 (L) 03/26/2022   HGB 10.8 (L) 04/07/2019   HGB 10.2 (L) 01/08/2019  Hgb now trending slow up, no fresh red blood per rectum  >>> rx per [facility]/ GI   3) protein calorie malnutrition  >>> rx per [facility]/ nutrition     Best Practice (right click and "Reselect all SmartList Selections" daily)   Per [facility]   Labs    CBC: Recent Labs  Lab 03/25/22 1005 03/25/22 1728 03/26/22 0352 03/27/22 0421 03/28/22 0401  WBC 5.6  --  9.1 7.3 7.1  HGB 9.4* 7.9* 8.3* 8.3* 8.6*  HCT 28.7* 23.7* 24.4* 25.2* 26.4*  MCV 93.5  --  89.7 92.3 92.3  PLT 260  --  257 231 546    Basic Metabolic Panel: Recent Labs  Lab 03/25/22 1005 03/26/22 0352 03/27/22 0421 03/28/22 0401  NA 139 136 136 137  K 4.1 3.3* 3.7 3.5  CL 105 105 106 107  CO2 28 24 26 26   GLUCOSE 131* 141* 105* 121*  BUN 34* 21 19 13   CREATININE 0.91 0.75 0.86 0.71  CALCIUM 9.3 8.5* 8.2* 8.1*  MG  --  1.7 1.8 1.7   GFR: Estimated Creatinine Clearance: 31.7 mL/min (by C-G formula based on SCr of 0.71 mg/dL). Recent Labs  Lab 03/25/22 1005 03/26/22 0352 03/27/22 0421 03/28/22 0401  WBC 5.6 9.1 7.3 7.1    Liver Function Tests: Recent Labs  Lab 03/25/22 1005 03/26/22 0352 03/27/22 0421  AST 20 17 12*  ALT 20 20 16   ALKPHOS 57 48 44  BILITOT 0.6 0.5 0.4  PROT 6.3* 5.3* 4.9*  ALBUMIN 3.1* 2.6* 2.3*   No results for input(s): "LIPASE", "AMYLASE" in the last 168 hours. No results for input(s): "AMMONIA" in the last 168 hours.  ABG    Component Value Date/Time   PHART 7.56 (H) 03/26/2022 0343   PCO2ART 30 (L) 03/26/2022 0343   PO2ART 144 (H) 03/26/2022 0343   HCO3 26.9 03/26/2022 0343   TCO2 23 07/02/2011 0416   O2SAT 98.7 03/26/2022 0343     Coagulation Profile: Recent Labs  Lab 03/25/22 1005  INR 1.3*    Cardiac Enzymes: No results for input(s): "CKTOTAL", "CKMB", "CKMBINDEX", "TROPONINI" in the last 168 hours.  HbA1C: Hgb A1c MFr Bld  Date/Time Value Ref Range Status  01/17/2019 05:39 AM 6.3 (H) 4.8 - 5.6 % Final    Comment:    (NOTE) Pre diabetes:          5.7%-6.4% Diabetes:              >6.4% Glycemic control for   <7.0% adults with diabetes     CBG: Recent Labs  Lab 03/26/22 0736 03/26/22 1119 03/26/22 2001 03/27/22 0031 03/27/22 0410  GLUCAP 126* 126* 141* 123* 107*      Tol extubation  well though mechanics marginal so rec minimize sedation / PCCM service f/u prn     Christinia Gully, MD [facility] [place] [facility] [phone]   After 7:00 pm call Elink  [phone]

## 2022-03-29 DIAGNOSIS — R1310 Dysphagia, unspecified: Secondary | ICD-10-CM

## 2022-03-29 DIAGNOSIS — K922 Gastrointestinal hemorrhage, unspecified: Secondary | ICD-10-CM | POA: Diagnosis not present

## 2022-03-29 LAB — CBC
HCT: 27.9 % — ABNORMAL LOW (ref 36.0–46.0)
Hemoglobin: 9 g/dL — ABNORMAL LOW (ref 12.0–15.0)
MCH: 30.9 pg (ref 26.0–34.0)
MCHC: 32.3 g/dL (ref 30.0–36.0)
MCV: 95.9 fL (ref 80.0–100.0)
Platelets: 220 10*3/uL (ref 150–400)
RBC: 2.91 MIL/uL — ABNORMAL LOW (ref 3.87–5.11)
RDW: 14.5 % (ref 11.5–15.5)
WBC: 10.6 10*3/uL — ABNORMAL HIGH (ref 4.0–10.5)
nRBC: 0 % (ref 0.0–0.2)

## 2022-03-29 LAB — BASIC METABOLIC PANEL
Anion gap: 9 (ref 5–15)
BUN: 11 mg/dL (ref 8–23)
CO2: 24 mmol/L (ref 22–32)
Calcium: 8.2 mg/dL — ABNORMAL LOW (ref 8.9–10.3)
Chloride: 103 mmol/L (ref 98–111)
Creatinine, Ser: 0.76 mg/dL (ref 0.44–1.00)
GFR, Estimated: >60 mL/min (ref >60)
Glucose, Bld: 279 mg/dL — ABNORMAL HIGH (ref 70–99)
Potassium: 3.5 mmol/L (ref 3.5–5.1)
Sodium: 136 mmol/L (ref 135–145)

## 2022-03-29 LAB — MAGNESIUM: Magnesium: 1.7 mg/dL (ref 1.7–2.4)

## 2022-03-29 NOTE — NC FL2 (Signed)
[place] LEVEL OF CARE SCREENING TOOL     IDENTIFICATION  Patient Name: IDANIA DESOUZA Birthdate: 09/12/1920 Sex: female Admission Date (Current Location): 03/25/2022  [facility] and [place] Number:  [employer] and Address:  [place] [address] [place]      Provider Number: [id number]  Attending Physician Name and Address:  Rodena Goldmann, DO  Relative Name and Phone Number:  Tedra Coupe (Daughter)   [phone]    Current Level of Care: Hospital Recommended Level of Care: [place] Prior Approval Number:    Date Approved/Denied:   PASRR Number: [id number]  Discharge Plan: SNF    Current Diagnoses: Patient Active Problem List   Diagnosis Date Noted   Acute hypoxemic respiratory failure ([place]) 03/27/2022   Hematemesis 03/26/2022   GI bleed 03/25/2022   Altered behavior 10/20/2020   Hemiparesis affecting left side as late effect of cerebrovascular accident (CVA) ([place]) 05/19/2019   Paroxysmal atrial fibrillation ([place]) 01/21/2019   Hyperlipidemia LDL goal <70 22/29/7989   Embolic stroke ([place]) R brain s/p tPA d/t AF 01/16/2019   Acute pain of left knee 07/17/2014   Sinoatrial node dysfunction ([place]) 09/26/2011   Cardiac pacemaker in situ 11/19/2008   Essential hypertension 11/07/2008   BRADYCARDIA 11/07/2008   GERD 11/07/2008   Diaphragmatic hernia 11/07/2008    Orientation RESPIRATION BLADDER Height & Weight     Self, Place  Normal Incontinent Weight: 54.8 kg Height:  5\' 3"  (160 cm)  BEHAVIORAL SYMPTOMS/MOOD NEUROLOGICAL BOWEL NUTRITION STATUS      Incontinent Diet  AMBULATORY STATUS COMMUNICATION OF NEEDS Skin   Extensive Assist Verbally Normal                       Personal Care Assistance Level of Assistance  Bathing, Feeding, Dressing Bathing Assistance: Maximum assistance Feeding assistance: Maximum assistance Dressing Assistance: Maximum assistance     Functional  Limitations Info  Sight, Hearing, Speech Sight Info: Impaired Hearing Info: Impaired Speech Info: Adequate    SPECIAL CARE FACTORS FREQUENCY  PT (By licensed PT)     PT Frequency: 5 x a week              Contractures      Additional Factors Info  Code Status, Allergies, Psychotropic Code Status Info: DNR Allergies Info: Horse-derived products, Penicillins, Tetanus Antitoxin Psychotropic Info: Seroquel         Current Medications (03/29/2022):  This is the current hospital active medication list Current Facility-Administered Medications  Medication Dose Route Frequency Provider Last Rate Last Admin   acetaminophen (TYLENOL) tablet 650 mg  650 mg Oral Q6H PRN Manuella Ghazi, Pratik D, DO       Or   acetaminophen (TYLENOL) suppository 650 mg  650 mg Rectal Q6H PRN Manuella Ghazi, Pratik D, DO       Chlorhexidine Gluconate Cloth 2 % PADS 6 each  6 each Topical Daily Manuella Ghazi, Pratik D, DO   6 each at 03/29/22 1028   dextrose 5 % in lactated ringers infusion   Intravenous Continuous Heath Lark D, DO 50 mL/hr at 03/28/22 2304 New Bag at 03/28/22 2304   hydrALAZINE (APRESOLINE) injection 10 mg  10 mg Intravenous Q4H PRN Manuella Ghazi, Pratik D, DO       ondansetron (ZOFRAN) tablet 4 mg  4 mg Oral Q6H PRN Manuella Ghazi, Pratik D, DO       Or   ondansetron (ZOFRAN) injection 4 mg  4 mg  Intravenous Q6H PRN Maurilio Lovely D, DO       Oral care mouth rinse  15 mL Mouth Rinse PRN Maurilio Lovely D, DO       Oral care mouth rinse  15 mL Mouth Rinse 4 times per day Maurilio Lovely D, DO   15 mL at 03/29/22 3810   Oral care mouth rinse  15 mL Mouth Rinse PRN Maurilio Lovely D, DO       [START ON 03/31/2022] pantoprazole (PROTONIX) injection 40 mg  40 mg Intravenous Q12H Ermalinda Memos S, PA-C       pantoprozole (PROTONIX) 80 mg /NS 100 mL infusion  8 mg/hr Intravenous Continuous Letta Median, PA-C 10 mL/hr at 03/29/22 1028 8 mg/hr at 03/29/22 1028     Discharge Medications: Please see discharge summary for a list of discharge  medications.  Relevant Imaging Results:  Relevant Lab Results:   Additional Information    Leitha Bleak, RN

## 2022-03-29 NOTE — TOC Progression Note (Signed)
Transition of Care [facility]) - Progression Note    Patient Details  Name: Kelsey Thompson MRN: [id number] Date of Birth: 29-Mar-1921  Transition of Care [facility]) CM/SW Contact  Boneta Lucks, RN Phone Number: 03/29/2022, 12:17 PM  Clinical Narrative:   PT is recommending SNF. TOC spoke with Juliann Pulse at [facility], she is agreeing that patient needs SNF before returning to ALF. TOC spoke with Daughter, she is agreeing to send out for bed offers in [place] and discuss offer with her. FL2 sent out.     Expected Discharge Plan: [place] Barriers to Discharge: Continued Medical Work up  Expected Discharge Plan and Services Expected Discharge Plan: [place] In-house Referral: Clinical Social Work   Post Acute Care Choice: [place] Living arrangements for the past 2 months: [place]

## 2022-03-29 NOTE — Plan of Care (Signed)
  Problem: Acute Rehab OT Goals (only OT should resolve) Goal: Pt. Will Perform Eating Flowsheets (Taken 03/29/2022 1055) Pt Will Perform Eating:  with set-up  sitting Goal: Pt. Will Perform Grooming Flowsheets (Taken 03/29/2022 1055) Pt Will Perform Grooming:  with min guard assist  sitting Goal: Pt. Will Perform Upper Body Bathing Flowsheets (Taken 03/29/2022 1055) Pt Will Perform Upper Body Bathing:  with min guard assist  sitting Goal: Pt. Will Perform Upper Body Dressing Flowsheets (Taken 03/29/2022 1055) Pt Will Perform Upper Body Dressing:  with min guard assist  sitting Goal: Pt. Will Perform Lower Body Dressing Flowsheets (Taken 03/29/2022 1055) Pt Will Perform Lower Body Dressing:  with min assist  sitting/lateral leans Goal: Pt. Will Transfer To Toilet Flowsheets (Taken 03/29/2022 1055) Pt Will Transfer to Toilet:  with min assist  stand pivot transfer Goal: Pt/Caregiver Will Perform Home Exercise Program Flowsheets (Taken 03/29/2022 1055) Pt/caregiver will Perform Home Exercise Program:  Increased ROM  Increased strength  Both right and left upper extremity  With minimal assist  Davie Claud OT, MOT

## 2022-03-29 NOTE — Evaluation (Signed)
Occupational Therapy Evaluation Patient Details Name: Kelsey Thompson MRN: [id number] DOB: [birth year] Today's Date: 03/29/2022   History of Present Illness Kelsey Thompson is a 88 y.o. female with medical history significant for paroxysmal atrial fibrillation on anticoagulation with Eliquis, pacemaker placement, prior CVA, hypertension, dyslipidemia, GERD, and dementia who was brought to the ED for evaluation of hematemesis that began earlier today.  She was noted to have some blood on her pants as well as in the trash can this morning.  She denies any significant abdominal pain, but continues to have some retching.  No fevers, chills, or diarrhea noted.  Patient is on a liquid diet at her facility and has had some weight loss recently.  Last dose of Eliquis noted to be this morning. (per DO)   Clinical Impression   Pt agreeable to OT and PT co-evaluation. Pt from ALF with unknown level of assist at baseline. Pt required max A for bed mobility and stand pivot transfer today.  Max A for donning socks with poor balance seated at EOB as well. Pt generally weak with noted contracture in 3-5 digits of L UE. Pt has cognitive limitations at baseline and is a poor historian. Pt left in chair with chair alarm set and nursing notified. Pt will benefit from continued OT in the hospital and recommended venue below to increase strength, balance, and endurance for safe ADL's.        Recommendations for follow up therapy are one component of a multi-disciplinary discharge planning process, led by the attending physician.  Recommendations may be updated based on patient status, additional functional criteria and insurance authorization.   Follow Up Recommendations  Skilled nursing-short term rehab (<3 hours/day)    Assistance Recommended at Discharge Frequent or constant Supervision/Assistance  Patient can return home with the following A lot of help with walking and/or transfers;A lot of help with  bathing/dressing/bathroom;Assistance with cooking/housework;Assistance with feeding;Direct supervision/assist for medications management;Direct supervision/assist for financial management;Assist for transportation;Help with stairs or ramp for entrance    Functional Status Assessment  Patient has had a recent decline in their functional status and demonstrates the ability to make significant improvements in function in a reasonable and predictable amount of time.  Equipment Recommendations  None recommended by OT    Recommendations for Other Services       Precautions / Restrictions Precautions Precautions: Fall Restrictions Weight Bearing Restrictions: No      Mobility Bed Mobility Overal bed mobility: Needs Assistance Bed Mobility: Supine to Sit     Supine to sit: Max assist, HOB elevated     General bed mobility comments: Max A to move B LE to EOB and come to sit at EOB.    Transfers Overall transfer level: Needs assistance Equipment used: Rolling walker (2 wheels) Transfers: Sit to/from Stand, Bed to chair/wheelchair/BSC Sit to Stand: Max assist Stand pivot transfers: Max assist         General transfer comment: RW triald for sit to stand without success. Stand pivot completed with PT assisting without RW.      Balance Overall balance assessment: Needs assistance Sitting-balance support: Bilateral upper extremity supported, Feet supported Sitting balance-Leahy Scale: Poor Sitting balance - Comments: poor seated EOB   Standing balance support: Bilateral upper extremity supported, During functional activity Standing balance-Leahy Scale: Poor Standing balance comment: with therpaist assisting maximally  ADL either performed or assessed with clinical judgement   ADL Overall ADL's : Needs assistance/impaired Eating/Feeding: Moderate assistance;Maximal assistance;Sitting   Grooming: Moderate assistance;Maximal  assistance;Sitting   Upper Body Bathing: Moderate assistance;Maximal assistance;Sitting   Lower Body Bathing: Maximal assistance;Bed level   Upper Body Dressing : Moderate assistance;Maximal assistance;Sitting   Lower Body Dressing: Maximal assistance;Bed level Lower Body Dressing Details (indicate cue type and reason): assited to don socks supine in bed. Toilet Transfer: Maximal Scientist, forensic Details (indicate cue type and reason): Simualted via EOB to chair with PT assisting without RW. Toileting- Clothing Manipulation and Hygiene: Maximal assistance;Bed level               Vision Baseline Vision/History: 0 No visual deficits Ability to See in Adequate Light: 0 Adequate Patient Visual Report: No change from baseline (per pt report) Vision Assessment?: No apparent visual deficits                Pertinent Vitals/Pain Pain Assessment Pain Assessment: Faces Faces Pain Scale: Hurts a little bit Pain Location: L leg with movement Pain Descriptors / Indicators: Guarding Pain Intervention(s): Limited activity within patient's tolerance, Monitored during session, Repositioned     Hand Dominance Right (per chart review)   Extremity/Trunk Assessment Upper Extremity Assessment Upper Extremity Assessment: Generalized weakness (WFL P/ROM of B shoulders but limited to 2+/5 MMT shoulder flexion supine at an incline in bed. L 4th and 5th digits in flexion contracture. 3rd digit seemingly partailly contracted and stiff at PIP as well.)   Lower Extremity Assessment Lower Extremity Assessment: Defer to PT evaluation   Cervical / Trunk Assessment Cervical / Trunk Assessment: Kyphotic   Communication Communication Communication: Expressive difficulties (Slow to respon. Not aways answering given qeustions. Shaking head "no" often.)   Cognition Arousal/Alertness: Awake/alert Behavior During Therapy: Flat affect Overall Cognitive Status: History of cognitive  impairments - at baseline                                 General Comments: Hx of dementia.                      Home Living Family/patient expects to be discharged to:: Assisted living                             Home Equipment: Rolling Walker (2 wheels);Cane - single point (per chart review)   Additional Comments: Pt is a poor historian.      Prior Functioning/Environment Prior Level of Function : Needs assist       Physical Assist : Mobility (physical);ADLs (physical)     Mobility Comments: Unsure of pt's mobility at ALF. ADLs Comments: Unsure of level of assist given to pt at ALF. Pt agreed with statement of needed support but pt is a poor historian.        OT Problem List: Decreased strength;Decreased range of motion;Decreased activity tolerance;Impaired balance (sitting and/or standing);Decreased coordination;Impaired UE functional use      OT Treatment/Interventions: Self-care/ADL training;Therapeutic exercise;Therapeutic activities;Patient/family education;Balance training;Cognitive remediation/compensation    OT Goals(Current goals can be found in the care plan section) Acute Rehab OT Goals Patient Stated Goal: Pt seemingly agreeable to go to rehab to get stronger. OT Goal Formulation: With patient Time For Goal Achievement: 04/12/22 Potential to Achieve Goals: Fair  OT Frequency: Min 2X/week    Co-evaluation PT/OT/SLP Co-Evaluation/Treatment:  Yes Reason for Co-Treatment: To address functional/ADL transfers   OT goals addressed during session: ADL's and self-care                       End of Session Equipment Utilized During Treatment: Rolling walker (2 wheels) Nurse Communication: Mobility status  Activity Tolerance: Patient tolerated treatment well Patient left: in chair;with chair alarm set;with call bell/phone within reach  OT Visit Diagnosis: Unsteadiness on feet (R26.81);Other abnormalities of gait and  mobility (R26.89);Muscle weakness (generalized) (M62.81)                Time: [id number] OT Time Calculation (min): 17 min Charges:  OT General Charges $OT Visit: 1 Visit OT Evaluation $OT Eval Low Complexity: 1 Low  Evagelia Knack OT, MOT  Larey Seat 03/29/2022, 10:53 AM

## 2022-03-29 NOTE — Evaluation (Signed)
Physical Therapy Evaluation Patient Details Name: Kelsey Thompson MRN: [id number] DOB: Nov 23, 1920 Today's Date: 03/29/2022  History of Present Illness  Kelsey Thompson is a 88 y.o. female with medical history significant for paroxysmal atrial fibrillation on anticoagulation with Eliquis, pacemaker placement, prior CVA, hypertension, dyslipidemia, GERD, and dementia who was brought to the ED for evaluation of hematemesis that began earlier today.  She was noted to have some blood on her pants as well as in the trash can this morning.  She denies any significant abdominal pain, but continues to have some retching.  No fevers, chills, or diarrhea noted.  Patient is on a liquid diet at her facility and has had some weight loss recently.  Last dose of Eliquis noted to be this morning.   Clinical Impression  Patient demonstrates slow labored movement for sitting up at bedside, once seated frequent loss of balance with poor trunk control, stood with RW, but had difficulty gripping with left hand due to contractures and unable to transfer using walker due to fall risk/generalized weakness.  Patient required Max assist stand pivot with right knee blocked to transfer to chair.  Patient tolerated sitting up in chair after therapy - RN notified.  Patient will benefit from continued skilled physical therapy in hospital and recommended venue below to increase strength, balance, endurance for safe ADLs and gait.          Recommendations for follow up therapy are one component of a multi-disciplinary discharge planning process, led by the attending physician.  Recommendations may be updated based on patient status, additional functional criteria and insurance authorization.  Follow Up Recommendations Skilled nursing-short term rehab (<3 hours/day) Can patient physically be transported by private vehicle: No    Assistance Recommended at Discharge Intermittent Supervision/Assistance  Patient can return home with  the following  A lot of help with bathing/dressing/bathroom;A lot of help with walking and/or transfers;Help with stairs or ramp for entrance;Assistance with cooking/housework    Equipment Recommendations None recommended by PT  Recommendations for Other Services       Functional Status Assessment Patient has had a recent decline in their functional status and demonstrates the ability to make significant improvements in function in a reasonable and predictable amount of time.     Precautions / Restrictions Precautions Precautions: Fall Restrictions Weight Bearing Restrictions: No      Mobility  Bed Mobility Overal bed mobility: Needs Assistance Bed Mobility: Supine to Sit     Supine to sit: Max assist, HOB elevated     General bed mobility comments: increased time, labored movement, repeated verbal/tactile cueing    Transfers Overall transfer level: Needs assistance   Transfers: Sit to/from Stand, Bed to chair/wheelchair/BSC Sit to Stand: Max assist Stand pivot transfers: Max assist         General transfer comment: max assist to stand with RW, but unable to transfer using RW due to weakness, required stand pivot with right knee blocked to transfer to chair    Ambulation/Gait                  Stairs            Wheelchair Mobility    Modified Rankin (Stroke Patients Only)       Balance Overall balance assessment: Needs assistance Sitting-balance support: Feet supported, No upper extremity supported Sitting balance-Leahy Scale: Poor Sitting balance - Comments: poor seated EOB   Standing balance support: Bilateral upper extremity supported, During functional activity Standing balance-Leahy Scale:  Poor Standing balance comment: using RW                             Pertinent Vitals/Pain Pain Assessment Pain Assessment: Faces Faces Pain Scale: Hurts a little bit Pain Location: L leg with movement Pain Descriptors / Indicators:  Guarding Pain Intervention(s): Limited activity within patient's tolerance, Monitored during session, Repositioned    Home Living Family/patient expects to be discharged to:: Assisted living                 Home Equipment: Agricultural consultant (2 wheels);Cane - single point Additional Comments: Pt is a poor historian.    Prior Function Prior Level of Function : Needs assist       Physical Assist : Mobility (physical);ADLs (physical)     Mobility Comments: Unsure of pt's mobility at ALF. ADLs Comments: Unsure of level of assist given to pt at ALF. Pt agreed with statement of needed support but pt is a poor historian.     Hand Dominance   Dominant Hand: Right    Extremity/Trunk Assessment   Upper Extremity Assessment Upper Extremity Assessment: Defer to OT evaluation    Lower Extremity Assessment Lower Extremity Assessment: Generalized weakness    Cervical / Trunk Assessment Cervical / Trunk Assessment: Kyphotic  Communication   Communication: Expressive difficulties  Cognition Arousal/Alertness: Awake/alert Behavior During Therapy: Flat affect Overall Cognitive Status: History of cognitive impairments - at baseline                                          General Comments      Exercises     Assessment/Plan    PT Assessment Patient needs continued PT services  PT Problem List Decreased strength;Decreased activity tolerance;Decreased balance;Decreased mobility       PT Treatment Interventions DME instruction;Gait training;Functional mobility training;Therapeutic activities;Therapeutic exercise;Patient/family education;Balance training    PT Goals (Current goals can be found in the Care Plan section)  Acute Rehab PT Goals Patient Stated Goal: return home PT Goal Formulation: With patient Time For Goal Achievement: 04/12/22 Potential to Achieve Goals: Good    Frequency Min 3X/week     Co-evaluation PT/OT/SLP Co-Evaluation/Treatment:  Yes Reason for Co-Treatment: To address functional/ADL transfers PT goals addressed during session: Mobility/safety with mobility;Balance;Proper use of DME OT goals addressed during session: ADL's and self-care       AM-PAC PT "6 Clicks" Mobility  Outcome Measure Help needed turning from your back to your side while in a flat bed without using bedrails?: A Lot Help needed moving from lying on your back to sitting on the side of a flat bed without using bedrails?: A Lot Help needed moving to and from a bed to a chair (including a wheelchair)?: A Lot Help needed standing up from a chair using your arms (e.g., wheelchair or bedside chair)?: A Lot Help needed to walk in hospital room?: Total Help needed climbing 3-5 steps with a railing? : Total 6 Click Score: 10    End of Session   Activity Tolerance: Patient tolerated treatment well;Patient limited by fatigue Patient left: in chair;with call bell/phone within reach Nurse Communication: Mobility status PT Visit Diagnosis: Unsteadiness on feet (R26.81);Other abnormalities of gait and mobility (R26.89);Muscle weakness (generalized) (M62.81)    Time: 3710-6269 PT Time Calculation (min) (ACUTE ONLY): 16 min   Charges:  PT Evaluation $PT Eval Low Complexity: 1 Low PT Treatments $Therapeutic Activity: 8-22 mins        12:29 PM, 03/29/22 Ocie Bob, MPT Physical Therapist with [facility] [phone] office [phone] mobile phone

## 2022-03-29 NOTE — Progress Notes (Signed)
Subjective: Overall difficult to obtain history from patient.  Suspect some underlying dementia.  She did not realize she was in the hospital and does not remember coming to the hospital or why she is here.  She does admit to having some trouble swallowing foods prior to admission stating that she had to be very careful.  Reports when speech therapy saw her today, that items that were presented to her seem to go down her esophagus fine, but she is having some intermittent low-volume regurgitation of saliva/clear liquid.  Denies nausea, vomiting, abdominal pain.   Objective: Vital signs in last 24 hours: Temp:  [97.7 F (36.5 C)-98.4 F (36.9 C)] 97.7 F (36.5 C) (09/27 1159) Pulse Rate:  [39-101] 64 (09/27 1400) Resp:  [11-23] 19 (09/27 1400) BP: (122-157)/(38-99) 142/99 (09/27 1400) SpO2:  [92 %-100 %] 95 % (09/27 1400) FiO2 (%):  [28 %] 28 % (09/26 2122)   General:   Alert, pleasant, chronically ill appearing frail, NAD. In bedside recliner and spitting up small amounts of clear phlegm into a napkin.  Head:  Normocephalic and atraumatic. Eyes:  No icterus, sclera clear. Conjuctiva pink.  Abdomen:  Bowel sounds present, soft, non-tender, non-distended. No HSM or hernias noted. No rebound or guarding. No masses appreciated  Msk:  Symmetrical without gross deformities. Normal posture. Extremities:  Without clubbing or edema. Neurologic:  Alert and  oriented to self and time, but not situation or location.  Psych: Normal mood and affect.  Intake/Output from previous day: 09/26 0701 - 09/27 0700 In: -  Out: 650 [Urine:650] Intake/Output this shift: No intake/output data recorded.  Lab Results: Recent Labs    03/27/22 0421 03/28/22 0401 03/29/22 1155  WBC 7.3 7.1 10.6*  HGB 8.3* 8.6* 9.0*  HCT 25.2* 26.4* 27.9*  PLT 231 227 220   BMET Recent Labs    03/27/22 0421 03/28/22 0401 03/29/22 1155  NA 136 137 136  K 3.7 3.5 3.5  CL 106 107 103  CO2 26 26 24   GLUCOSE  105* 121* 279*  BUN 19 13 11   CREATININE 0.86 0.71 0.76  CALCIUM 8.2* 8.1* 8.2*   LFT Recent Labs    03/27/22 0421  PROT 4.9*  ALBUMIN 2.3*  AST 12*  ALT 16  ALKPHOS 44  BILITOT 0.4   Studies/Results: DG Chest Port 1 View  Result Date: 03/28/2022 CLINICAL DATA:  Respiratory failure EXAM: PORTABLE CHEST 1 VIEW COMPARISON:  03/25/2022 FINDINGS: Interval endotracheal intubation, tube tip over the midtrachea. Cardiomegaly with left chest multi lead pacer. New small bilateral pleural effusions. Lungs are normally aerated. IMPRESSION: 1. Interval endotracheal intubation, tube tip over the midtrachea. 2. New small bilateral pleural effusions. 3. Cardiomegaly. Electronically Signed   By: 03/30/2022 M.D.   On: 03/28/2022 09:17    Assessment: 88 year old lady with chronic atrial fibrillation on Eliquis brought to the ED by EMS 9/23 from the nursing home with hematemesis, progressive solid food dysphagia, and reported weight loss over the last year per daughter.  Hemoglobin was 9.4, down from 12.8 previously, Hemoccult positive, but overall remained hemodynamically stable.   Progressive dysphagia/hematemesis/acute blood loss anemia: - EGD 9/23 with intubation revealed large double barrel Zenker's diverticulum markedly displacing the esophageal lumen, active bleeding with large amount of clot in the esophagus.  Incomplete examination; however, esophageal ulceration was seen.  Hemospray deployed.  Moderate size hiatal hernia.  Grossly normal gastric and duodenal mucosa.  - PPI infusion initiated after first EGD.  - Repeat EGD 9/25  with Zenker's diverticulum, grade D esophagitis in the lower third of the esophagus, bleeding Mallory-Weiss tear with stigmata of recent bleeding in the distal esophagus injected with epinephrine and treated with bipolar cautery.  Hematin in the entire stomach. Recommended PPI infusion x 72 hours. - Extubated on 9/26.  - Speech therapy saw patient this morning.  She was  found to have slow lingual movement resulting in delayed oral transit with pure, otherwise no overt signs of aspiration or globus sensation.  Due to Zenker's diverticulum increasing patient's risk for aspiration, recommended modified barium swallow tomorrow to determine if altered consistencies/textures were recommended.  For now, recommended clear liquids, crushed medications in small amount of pure or IV administration. - Remains to be seen if dysphagia was primarily driven by severe esophagitis vs Zenker's diverticulum. Non-the less, patient would not be a candidate for surgical management of Zenker's and daughter states she wouldn't want her mother to have surgery regardless or have a feeding tube placed.  - Hemoglobin declined as low as 7.9 on 9/23. She has not required PRBCs and hemoglobin is slowly improving, 9.0 today. No further overt GI bleeding.    Plan: Continue IV PPI infusion until 9/28 (72 hours after last EGD). Then transition to PPI BID.   Appreciate Speech therapy involvement.  Agree with clear liquids today pending MBSS tomorrow.  Needs to be seated upright at 90 degrees with meals and remain upright for at least 30 minutes after po intake.  Meds need to be crushed in puree or IV.  Continue to monitor H/H.  Can consider repeat EGD with dilation as appropriate in about 4 weeks.    LOS: 4 days    03/29/2022, 2:33 PM   Aliene Altes, [facility]

## 2022-03-29 NOTE — Plan of Care (Signed)
  Problem: Acute Rehab PT Goals(only PT should resolve) Goal: Pt Will Go Supine/Side To Sit Outcome: Progressing Flowsheets (Taken 03/29/2022 1344) Pt will go Supine/Side to Sit: with moderate assist Goal: Patient Will Transfer Sit To/From Stand Outcome: Progressing Flowsheets (Taken 03/29/2022 1344) Patient will transfer sit to/from stand: with moderate assist Goal: Pt Will Transfer Bed To Chair/Chair To Bed Outcome: Progressing Flowsheets (Taken 03/29/2022 1344) Pt will Transfer Bed to Chair/Chair to Bed: with mod assist Goal: Pt Will Ambulate Outcome: Progressing Flowsheets (Taken 03/29/2022 1344) Pt will Ambulate:  10 feet  with moderate assist  with rolling walker   1:44 PM, 03/29/22 Lonell Grandchild, MPT Physical Therapist with [facility] 336 [phone] office [phone] mobile phone

## 2022-03-29 NOTE — Evaluation (Signed)
Clinical/Bedside Swallow Evaluation Patient Details  Name: ALBIRTA RHINEHART MRN: [id number] Date of Birth: 1921/01/02  Today's Date: 03/29/2022 Time: SLP Start Time (ACUTE ONLY): 1200 SLP Stop Time (ACUTE ONLY): 1228 SLP Time Calculation (min) (ACUTE ONLY): 28 min  Past Medical History:  Past Medical History:  Diagnosis Date   Arthritis    Bradycardia    GERD (gastroesophageal reflux disease)    Hiatal hernia    Hypercholesterolemia    Hypertension    Pacemaker    Stroke [facility])    Past Surgical History:  Past Surgical History:  Procedure Laterality Date   APPENDECTOMY     CESAREAN SECTION     PACEMAKER INSERTION     TONSILLECTOMY     HPI:  TANICIA WOLAVER is a 88 y.o. female with medical history significant for paroxysmal atrial fibrillation on anticoagulation with Eliquis, pacemaker placement, prior CVA, hypertension, dyslipidemia, GERD, and dementia who was brought to the ED for evaluation of hematemesis that began earlier on 9/23 at her ALF.  She had her Eliquis reversed and underwent urgent EGD on 9/23 with findings of Zenker's diverticulum with large blood clot and signs of active bleeding.  She had Hemospray performed and has plans for repeat EGD by 9/25 and was intubated after the procedure.  She was extubated on 9/26.  She currently remains on PPI infusion. BSE requested.    Assessment / Plan / Recommendation  Clinical Impression  Clinical swallow evaluation completed with Pt seated upright in chair. She tells SLP that things are "miserable". Oral motor examination reveals poor dentition and thick mucous in oral cavity, otherwise WNL. Pt assessed with ice chips, water via cup/straw, and puree. Pt with slow lingual movements and this results in delayed oral transit with puree, otherwise no overt signs of aspiration or globus sensation. SLP reviewed EGDs completed which are notable for large double barrel Zenker's diverticulum and Mallory-Weiss tear. Pt is at a heightened risk for  aspiration due to large Zenker's (possible backflow into the pharynx/larynx). Acknowledge that Pt would not want a feeding tube, however MBSS would be helpful in determing if Pt may better tolerate altered consistencies/textures due to the Zenker's (thin versus thickened and puree). Would recommend starting clear liquids today and hope to complete MBSS tomorrow afternoon, crush medications in small amount of puree and follow with liquid wash or administer via IV as able (pills are likely to collect in the Zenker's). Pt should be alert and upright for all eating/drinking and remain upright for at least 30 minutes after. Above recommendations were reviewed with nursing. SLP will follow. SLP Visit Diagnosis: Dysphagia, pharyngoesophageal phase (R13.14)    Aspiration Risk  Moderate aspiration risk;Risk for inadequate nutrition/hydration;Mild aspiration risk    Diet Recommendation  (clear liquids per GI)   Liquid Administration via: Cup;Straw Medication Administration: Crushed with puree Supervision: Staff to assist with self feeding;Full supervision/cueing for compensatory strategies Compensations: Slow rate;Small sips/bites Postural Changes: Seated upright at 90 degrees;Remain upright for at least 30 minutes after po intake    Other  Recommendations Oral Care Recommendations: Oral care BID;Staff/trained caregiver to provide oral care Other Recommendations: Clarify dietary restrictions    Recommendations for follow up therapy are one component of a multi-disciplinary discharge planning process, led by the attending physician.  Recommendations may be updated based on patient status, additional functional criteria and insurance authorization.  Follow up Recommendations Skilled nursing-short term rehab (<3 hours/day)      Assistance Recommended at Discharge Frequent or constant Supervision/Assistance  Functional  Status Assessment Patient has had a recent decline in their functional status and  demonstrates the ability to make significant improvements in function in a reasonable and predictable amount of time.  Frequency and Duration min 2x/week  1 week       Prognosis Prognosis for Safe Diet Advancement: Fair Barriers to Reach Goals: Severity of deficits      Swallow Study   General Date of Onset: 03/26/22 HPI: GLYNNIS GAVEL is a 88 y.o. female with medical history significant for paroxysmal atrial fibrillation on anticoagulation with Eliquis, pacemaker placement, prior CVA, hypertension, dyslipidemia, GERD, and dementia who was brought to the ED for evaluation of hematemesis that began earlier on 9/23 at her ALF.  She had her Eliquis reversed and underwent urgent EGD on 9/23 with findings of Zenker's diverticulum with large blood clot and signs of active bleeding.  She had Hemospray performed and has plans for repeat EGD by 9/25 and was intubated after the procedure.  She was extubated on 9/26.  She currently remains on PPI infusion. BSE requested. Type of Study: Bedside Swallow Evaluation Previous Swallow Assessment: EGD x2 this week Diet Prior to this Study: NPO Temperature Spikes Noted: No Respiratory Status: Room air History of Recent Intubation: Yes Length of Intubations (days): 2 days Date extubated: 03/28/22 Behavior/Cognition: Alert;Cooperative;Pleasant mood Oral Cavity Assessment: Excessive secretions Oral Care Completed by SLP: Yes Oral Cavity - Dentition: Poor condition;Missing dentition Vision: Functional for self-feeding Self-Feeding Abilities: Needs assist Patient Positioning: Upright in chair Baseline Vocal Quality: Normal;Wet Volitional Cough: Strong;Congested Volitional Swallow: Able to elicit    Oral/Motor/Sensory Function Overall Oral Motor/Sensory Function: Within functional limits   Ice Chips Ice chips: Within functional limits Presentation: Spoon   Thin Liquid Thin Liquid: Within functional limits Presentation: Cup;Straw    Nectar Thick  Nectar Thick Liquid: Not tested   Honey Thick Honey Thick Liquid: Not tested   Puree Puree: Impaired Presentation: Spoon Oral Phase Impairments: Reduced lingual movement/coordination Oral Phase Functional Implications: Prolonged oral transit   Solid     Solid: Not tested     Thank you,  Havery Moros, CCC-SLP [phone]  Allyne Hebert 03/29/2022,12:30 PM

## 2022-03-29 NOTE — Progress Notes (Signed)
PROGRESS NOTE    Kelsey Thompson  ZOX:[id number] DOB: [birth year] DOA: 03/25/2022 PCP: Mechele Claude, MD   Brief Narrative:    Kelsey Thompson is a 88 y.o. female with medical history significant for paroxysmal atrial fibrillation on anticoagulation with Eliquis, pacemaker placement, prior CVA, hypertension, dyslipidemia, GERD, and dementia who was brought to the ED for evaluation of hematemesis that began earlier on 9/23 at her ALF.  She had her Eliquis reversed and underwent urgent EGD on 9/23 with findings of Zenker's diverticulum with large blood clot and signs of active bleeding.  She had Hemospray performed and has plans for repeat EGD by 9/25 and was intubated after the procedure.  She was extubated on 9/26.  She currently remains on PPI infusion.  PT/OT recommending SNF placement.  Assessment & Plan:   Principal Problem:   GI bleed Active Problems:   Essential hypertension   GERD   Cardiac pacemaker in situ   Embolic stroke (HCC) R brain s/p tPA d/t AF   Paroxysmal atrial fibrillation (HCC)   Hyperlipidemia LDL goal <70   Hemiparesis affecting left side as late effect of cerebrovascular accident (CVA) (HCC)   Hematemesis   Acute hypoxemic respiratory failure (HCC)  Assessment and Plan:   Acute blood loss anemia secondary to upper GI bleed Status post EGD 9/23 with findings of large adherent clot and active bleeding with Zenker's diverticulum, repeat EGD performed 9/25 Transfuse for hemoglobin less than 7, currently stable IV fluid, gentle with D5 PPI infusion ongoing through 9/28 Appreciate ongoing GI evaluation N.p.o. for now and advance diet per GI recommendations Holding home Eliquis with reversal on 9/23 Planning for extubation today, dietary advancement per GI Hemoglobin and hematocrit currently stable   PAF with PPM on chronic anticoagulation Holding Eliquis Continue telemetry monitoring   Prior CVA   Hypertension Hold antihypertensives IV hydralazine as  needed for severe elevations   Dyslipidemia Hold statin   GERD IV PPI infusion as ordered   Dementia with behavioral disturbances Hold donepezil Hold Seroquel     DVT prophylaxis:SCDs Code Status: DNR Family Communication: Discussed with daughter on phone 9/25 Disposition Plan:  Status is: Inpatient Remains inpatient appropriate because: Intubation/IV meds.   Consultants:  GI Pulmonology   Procedures:  EGD 9/23 Intubation 9/23   Antimicrobials:  None     Subjective: Patient seen and evaluated today and is intermittently awake and alert.  No acute overnight events noted.  She currently remains on Protonix infusion.  Bedside swallow evaluation pending and she is currently still n.p.o.  Objective: Vitals:   03/29/22 0600 03/29/22 0700 03/29/22 0752 03/29/22 0804  BP: 138/62 (!) 142/63    Pulse: 60  (!) 57 (!) 55  Resp: 15 12 11 12   Temp:    98.4 F (36.9 C)  TempSrc:    Oral  SpO2: 100% 100% 100% 100%  Weight:      Height:        Intake/Output Summary (Last 24 hours) at 03/29/2022 1139 Last data filed at 03/29/2022 0620 Gross per 24 hour  Intake --  Output 650 ml  Net -650 ml   Filed Weights   03/25/22 1344 03/26/22 0433 03/28/22 0019  Weight: 54.9 kg 49.9 kg 54.8 kg    Examination:  General exam: Appears calm and comfortable  Respiratory system: Clear to auscultation. Respiratory effort normal.  Nasal cannula oxygen Cardiovascular system: S1 & S2 heard, RRR.  Gastrointestinal system: Abdomen is soft Central nervous system: Alert and awake Extremities: No  edema Skin: No significant lesions noted Psychiatry: Flat affect.    Data Reviewed: I have personally reviewed following labs and imaging studies  CBC: Recent Labs  Lab 03/25/22 1005 03/25/22 1728 03/26/22 0352 03/27/22 0421 03/28/22 0401  WBC 5.6  --  9.1 7.3 7.1  HGB 9.4* 7.9* 8.3* 8.3* 8.6*  HCT 28.7* 23.7* 24.4* 25.2* 26.4*  MCV 93.5  --  89.7 92.3 92.3  PLT 260  --  257 231 086    Basic Metabolic Panel: Recent Labs  Lab 03/25/22 1005 03/26/22 0352 03/27/22 0421 03/28/22 0401  NA 139 136 136 137  K 4.1 3.3* 3.7 3.5  CL 105 105 106 107  CO2 28 24 26 26   GLUCOSE 131* 141* 105* 121*  BUN 34* 21 19 13   CREATININE 0.91 0.75 0.86 0.71  CALCIUM 9.3 8.5* 8.2* 8.1*  MG  --  1.7 1.8 1.7   GFR: Estimated Creatinine Clearance: 31.7 mL/min (by C-G formula based on SCr of 0.71 mg/dL). Liver Function Tests: Recent Labs  Lab 03/25/22 1005 03/26/22 0352 03/27/22 0421  AST 20 17 12*  ALT 20 20 16   ALKPHOS 57 48 44  BILITOT 0.6 0.5 0.4  PROT 6.3* 5.3* 4.9*  ALBUMIN 3.1* 2.6* 2.3*   No results for input(s): "LIPASE", "AMYLASE" in the last 168 hours. No results for input(s): "AMMONIA" in the last 168 hours. Coagulation Profile: Recent Labs  Lab 03/25/22 1005  INR 1.3*   Cardiac Enzymes: No results for input(s): "CKTOTAL", "CKMB", "CKMBINDEX", "TROPONINI" in the last 168 hours. BNP (last 3 results) No results for input(s): "PROBNP" in the last 8760 hours. HbA1C: No results for input(s): "HGBA1C" in the last 72 hours. CBG: Recent Labs  Lab 03/26/22 0736 03/26/22 1119 03/26/22 2001 03/27/22 0031 03/27/22 0410  GLUCAP 126* 126* 141* 123* 107*   Lipid Profile: No results for input(s): "CHOL", "HDL", "LDLCALC", "TRIG", "CHOLHDL", "LDLDIRECT" in the last 72 hours. Thyroid Function Tests: No results for input(s): "TSH", "T4TOTAL", "FREET4", "T3FREE", "THYROIDAB" in the last 72 hours. Anemia Panel: No results for input(s): "VITAMINB12", "FOLATE", "FERRITIN", "TIBC", "IRON", "RETICCTPCT" in the last 72 hours. Sepsis Labs: No results for input(s): "PROCALCITON", "LATICACIDVEN" in the last 168 hours.  No results found for this or any previous visit (from the past 240 hour(s)).       Radiology Studies: DG Chest Port 1 View  Result Date: 03/28/2022 CLINICAL DATA:  Respiratory failure EXAM: PORTABLE CHEST 1 VIEW COMPARISON:  03/25/2022 FINDINGS:  Interval endotracheal intubation, tube tip over the midtrachea. Cardiomegaly with left chest multi lead pacer. New small bilateral pleural effusions. Lungs are normally aerated. IMPRESSION: 1. Interval endotracheal intubation, tube tip over the midtrachea. 2. New small bilateral pleural effusions. 3. Cardiomegaly. Electronically Signed   By: Delanna Ahmadi M.D.   On: 03/28/2022 09:17        Scheduled Meds:  Chlorhexidine Gluconate Cloth  6 each Topical Daily   mouth rinse  15 mL Mouth Rinse 4 times per day   [START ON 03/31/2022] pantoprazole (PROTONIX) IV  40 mg Intravenous Q12H   Continuous Infusions:  dextrose 5% lactated ringers 50 mL/hr at 03/28/22 2304   pantoprazole 8 mg/hr (03/29/22 1028)     LOS: 4 days    Time spent: 35 minutes    Gwendalynn Eckstrom Darleen Crocker, DO [facility]  If 7PM-7AM, please contact night-coverage [email or web address] 03/29/2022, 11:39 AM

## 2022-03-30 ENCOUNTER — Encounter (HOSPITAL_COMMUNITY): Payer: Self-pay | Admitting: Internal Medicine

## 2022-03-30 ENCOUNTER — Inpatient Hospital Stay (HOSPITAL_COMMUNITY): Payer: Medicare Other

## 2022-03-30 DIAGNOSIS — K922 Gastrointestinal hemorrhage, unspecified: Secondary | ICD-10-CM | POA: Diagnosis not present

## 2022-03-30 LAB — CBC
HCT: 28.8 % — ABNORMAL LOW (ref 36.0–46.0)
Hemoglobin: 9.1 g/dL — ABNORMAL LOW (ref 12.0–15.0)
MCH: 30.8 pg (ref 26.0–34.0)
MCHC: 31.6 g/dL (ref 30.0–36.0)
MCV: 97.6 fL (ref 80.0–100.0)
Platelets: 238 10*3/uL (ref 150–400)
RBC: 2.95 MIL/uL — ABNORMAL LOW (ref 3.87–5.11)
RDW: 14.6 % (ref 11.5–15.5)
WBC: 10.6 10*3/uL — ABNORMAL HIGH (ref 4.0–10.5)
nRBC: 0.8 % — ABNORMAL HIGH (ref 0.0–0.2)

## 2022-03-30 LAB — MAGNESIUM: Magnesium: 1.7 mg/dL (ref 1.7–2.4)

## 2022-03-30 LAB — BASIC METABOLIC PANEL
Anion gap: 6 (ref 5–15)
BUN: 13 mg/dL (ref 8–23)
CO2: 25 mmol/L (ref 22–32)
Calcium: 8.2 mg/dL — ABNORMAL LOW (ref 8.9–10.3)
Chloride: 105 mmol/L (ref 98–111)
Creatinine, Ser: 0.77 mg/dL (ref 0.44–1.00)
GFR, Estimated: >60 mL/min (ref >60)
Glucose, Bld: 116 mg/dL — ABNORMAL HIGH (ref 70–99)
Potassium: 3.3 mmol/L — ABNORMAL LOW (ref 3.5–5.1)
Sodium: 136 mmol/L (ref 135–145)

## 2022-03-30 MED ORDER — POTASSIUM CHLORIDE 20 MEQ PO PACK
40.0000 meq | PACK | Freq: Once | ORAL | Status: AC
  Administered 2022-03-30: 40 meq via ORAL
  Filled 2022-03-30: qty 2

## 2022-03-30 NOTE — Progress Notes (Signed)
Patient complaining of pain to Right lower arm, below IV site. Right lower arm is red, hot and edematous. MD notified. Instructed to place ice, elevate extremity. Patient given Tylenol for pain.   Tolerating oral liquids, IVF not infusing at this time. MD aware.

## 2022-03-30 NOTE — TOC Progression Note (Signed)
Transition of Care [facility]) - Progression Note    Patient Details  Name: Kelsey Thompson MRN: [id number] Date of Birth: Mar 09, 1921  [facility] CM/SW Contact  Boneta Lucks, RN Phone Number: 03/30/2022, 11:08 AM  Clinical Narrative:   [facility] made a bed offer. Daughter is accepting and INS Auth started. They will not admit on the weekend. MD and family updated.   Expected Discharge Plan: [place] Barriers to Discharge: Continued Medical Work up  Expected Discharge Plan and Services Expected Discharge Plan: [place] In-house Referral: Clinical Social Work   Post Acute Care Choice: [place] Living arrangements for the past 2 months: [place]                     Readmission Risk Interventions    03/30/2022   11:07 AM  Readmission Risk Prevention Plan  Post Dischage Appt Not Complete  Medication Screening Complete  Transportation Screening Complete

## 2022-03-30 NOTE — Progress Notes (Signed)
PROGRESS NOTE    Kelsey Thompson  QPY:[id number] DOB: 1921-05-03 DOA: 03/25/2022 PCP: Claretta Fraise, MD   Brief Narrative:    Kelsey Thompson is a 88 y.o. female with medical history significant for paroxysmal atrial fibrillation on anticoagulation with Eliquis, pacemaker placement, prior CVA, hypertension, dyslipidemia, GERD, and dementia who was brought to the ED for evaluation of hematemesis that began earlier on 9/23 at her ALF.  She had her Eliquis reversed and underwent urgent EGD on 9/23 with findings of Zenker's diverticulum with large blood clot and signs of active bleeding.  She had Hemospray performed and has plans for repeat EGD by 9/25 and was intubated after the procedure.  She was extubated on 9/26.  She currently remains on PPI infusion.  PT/OT recommending SNF placement.  Assessment & Plan:   Principal Problem:   GI bleed Active Problems:   Essential hypertension   GERD   Cardiac pacemaker in situ   Embolic stroke (HCC) R brain s/p tPA d/t AF   Paroxysmal atrial fibrillation (HCC)   Hyperlipidemia LDL goal <70   Hemiparesis affecting left side as late effect of cerebrovascular accident (CVA) (Kelsey Thompson)   Hematemesis   Acute hypoxemic respiratory failure (HCC)   Dysphagia  Assessment and Plan:   Acute blood loss anemia secondary to upper GI bleed Status post EGD 9/23 with findings of large adherent clot and active bleeding with Zenker's diverticulum, repeat EGD performed 9/25 Transfuse for hemoglobin less than 7, currently stable IV fluid, gentle with D5 PPI infusion ongoing through 9/28 Appreciate ongoing GI evaluation Clear liquid diet and MBS recommendations per SLP today Holding home Eliquis with reversal on 9/23 Hemoglobin and hematocrit currently stable   PAF with PPM on chronic anticoagulation Holding Eliquis Continue telemetry monitoring  Mild hypokalemia Replete and reevaluate in a.m.   Prior CVA   Hypertension Hold antihypertensives IV  hydralazine as needed for severe elevations   Dyslipidemia Hold statin   GERD IV PPI infusion as ordered   Dementia with behavioral disturbances Hold donepezil Hold Seroquel     DVT prophylaxis:SCDs Code Status: DNR Family Communication: Discussed with daughter Kelsey Thompson 9/27 Disposition Plan:  Status is: Inpatient Remains inpatient appropriate because: Intubation/IV meds.   Consultants:  GI Pulmonology   Procedures:  EGD 9/23 Intubation 9/23   Antimicrobials:  None   Subjective: Patient seen and evaluated today with no new acute complaints or concerns. No acute concerns or events noted overnight.  She appears to be tolerating clear liquid diet.  Objective: Vitals:   03/30/22 0400 03/30/22 0500 03/30/22 0600 03/30/22 0719  BP: (!) 146/49 (!) 145/51 (!) 139/46   Pulse: 62 (!) 43 69 (!) 59  Resp: 13 13 19 14   Temp: 98 F (36.7 C)   98.1 F (36.7 C)  TempSrc: Oral   Axillary  SpO2: 100% 100% 100% 100%  Weight:  52.4 kg    Height:        Intake/Output Summary (Last 24 hours) at 03/30/2022 1041 Last data filed at 03/30/2022 1016 Gross per 24 hour  Intake 3005.78 ml  Output 100 ml  Net 2905.78 ml   Filed Weights   03/26/22 0433 03/28/22 0019 03/30/22 0500  Weight: 49.9 kg 54.8 kg 52.4 kg    Examination:  General exam: Appears calm and comfortable, frail/weak Respiratory system: Clear to auscultation. Respiratory effort normal.  Nasal cannula oxygen Cardiovascular system: S1 & S2 heard, RRR.  Gastrointestinal system: Abdomen is soft Central nervous system: Alert and awake Extremities: No  edema Skin: No significant lesions noted Psychiatry: Flat affect.    Data Reviewed: I have personally reviewed following labs and imaging studies  CBC: Recent Labs  Lab 03/26/22 0352 03/27/22 0421 03/28/22 0401 03/29/22 1155 03/30/22 0539  WBC 9.1 7.3 7.1 10.6* 10.6*  HGB 8.3* 8.3* 8.6* 9.0* 9.1*  HCT 24.4* 25.2* 26.4* 27.9* 28.8*  MCV 89.7 92.3 92.3 95.9 97.6   PLT 257 231 227 220 238   Basic Metabolic Panel: Recent Labs  Lab 03/26/22 0352 03/27/22 0421 03/28/22 0401 03/29/22 1155 03/30/22 0539  NA 136 136 137 136 136  K 3.3* 3.7 3.5 3.5 3.3*  CL 105 106 107 103 105  CO2 24 26 26 24 25   GLUCOSE 141* 105* 121* 279* 116*  BUN 21 19 13 11 13   CREATININE 0.75 0.86 0.71 0.76 0.77  CALCIUM 8.5* 8.2* 8.1* 8.2* 8.2*  MG 1.7 1.8 1.7 1.7 1.7   GFR: Estimated Creatinine Clearance: 31.7 mL/min (by C-G formula based on SCr of 0.77 mg/dL). Liver Function Tests: Recent Labs  Lab 03/25/22 1005 03/26/22 0352 03/27/22 0421  AST 20 17 12*  ALT 20 20 16   ALKPHOS 57 48 44  BILITOT 0.6 0.5 0.4  PROT 6.3* 5.3* 4.9*  ALBUMIN 3.1* 2.6* 2.3*   No results for input(s): "LIPASE", "AMYLASE" in the last 168 hours. No results for input(s): "AMMONIA" in the last 168 hours. Coagulation Profile: Recent Labs  Lab 03/25/22 1005  INR 1.3*   Cardiac Enzymes: No results for input(s): "CKTOTAL", "CKMB", "CKMBINDEX", "TROPONINI" in the last 168 hours. BNP (last 3 results) No results for input(s): "PROBNP" in the last 8760 hours. HbA1C: No results for input(s): "HGBA1C" in the last 72 hours. CBG: Recent Labs  Lab 03/26/22 0736 03/26/22 1119 03/26/22 2001 03/27/22 0031 03/27/22 0410  GLUCAP 126* 126* 141* 123* 107*   Lipid Profile: No results for input(s): "CHOL", "HDL", "LDLCALC", "TRIG", "CHOLHDL", "LDLDIRECT" in the last 72 hours. Thyroid Function Tests: No results for input(s): "TSH", "T4TOTAL", "FREET4", "T3FREE", "THYROIDAB" in the last 72 hours. Anemia Panel: No results for input(s): "VITAMINB12", "FOLATE", "FERRITIN", "TIBC", "IRON", "RETICCTPCT" in the last 72 hours. Sepsis Labs: No results for input(s): "PROCALCITON", "LATICACIDVEN" in the last 168 hours.  No results found for this or any previous visit (from the past 240 hour(s)).       Radiology Studies: No results found.      Scheduled Meds:  Chlorhexidine Gluconate  Cloth  6 each Topical Daily   mouth rinse  15 mL Mouth Rinse 4 times per day   [START ON 03/31/2022] pantoprazole (PROTONIX) IV  40 mg Intravenous Q12H   potassium chloride  40 mEq Oral Once   Continuous Infusions:  dextrose 5% lactated ringers 50 mL/hr at 03/30/22 1016   pantoprazole 8 mg/hr (03/30/22 1016)     LOS: 5 days    Time spent: 35 minutes    Veatrice Eckstein 04/02/2022, DO [facility]  If 7PM-7AM, please contact night-coverage [email or web address] 03/30/2022, 10:41 AM

## 2022-03-30 NOTE — Progress Notes (Signed)
Modified Barium Swallow Progress Note  Patient Details  Name: Kelsey Thompson MRN: [id number] Date of Birth: May 24, 1921  Today's Date: 03/30/2022  Modified Barium Swallow completed.  Full report located under Chart Review in the Imaging Section.  Brief recommendations include the following:  Clinical Impression  Pt presents with min oropharyngeal dysphagia and primary pharyngoesophageal dysphagia characterized by min delayed oral transit with piecemeal deglutition of purees and solids with min lingual residue, impaired anterior posterior transit of barium tablet and Pt ended up chewing up the barium tablet; swallow trigger at the level of the valleculae with cup sips of thin and at the level of the pyriforms with straw sips. She exhibited min vallecular and lateral channel pooling after the swallow when consuming mixed consistencies (chewing the pill and swallowing thins), however this cleared with independent repeat/dry swallow. Pt had Zenker's diverticulum identified on her recent EGD and this was observed in the MBSS which resulted in retention of thins and puree. No backflow into the pharynx was observed despite cued coughing to see if this would elicit. Pt did not consume a great amount however and retrograde movement possible over the course of a meal. Recommend D2/fine chop and thin liquids and ensure that Pt is alert and upright for all eating and drinking and remain upright for at least 30 minutes after. No further SLP services indicated at this time. Above reviewed with RN.    Swallow Evaluation Recommendations       SLP Diet Recommendations: Dysphagia 2 (Fine chop) solids;Thin liquid   Liquid Administration via: Cup;Straw   Medication Administration: Crushed with puree   Supervision: Patient able to self feed;Full supervision/cueing for compensatory strategies   Compensations: Slow rate;Small sips/bites   Postural Changes: Remain semi-upright after after feeds/meals  (Comment);Seated upright at 90 degrees   Oral Care Recommendations: Oral care BID   Other Recommendations: Clarify dietary restrictions   Thank you,  Genene Churn, [place]  Slate Debroux 03/30/2022,4:06 PM

## 2022-03-30 NOTE — Progress Notes (Signed)
Occupational Therapy Treatment Patient Details Name: Kelsey Thompson MRN: [id number] DOB: 06-18-1921 Today's Date: 03/30/2022   History of present illness Kelsey Thompson is a 88 y.o. female with medical history significant for paroxysmal atrial fibrillation on anticoagulation with Eliquis, pacemaker placement, prior CVA, hypertension, dyslipidemia, GERD, and dementia who was brought to the ED for evaluation of hematemesis that began earlier today.  She was noted to have some blood on her pants as well as in the trash can this morning.  She denies any significant abdominal pain, but continues to have some retching.  No fevers, chills, or diarrhea noted.  Patient is on a liquid diet at her facility and has had some weight loss recently.  Last dose of Eliquis noted to be this morning.   OT comments  Pt agreeable to OT treatment. Pt demonstrates improved cognition with more understandable verbalization and reciprocal conversation. Pt is still very weak in B UE and lower extremities. Mild improvement in bed mobility but still needing mod to max A. Max A still needed for stand pivot to chair with poor balance, but once in the chair pt demonstrated ability to wash her face with a washcloth and doff socks without physical assistance. Pt was left in chair with chair alarm set and nursing notified pt was ready to have assistance with eating.    Recommendations for follow up therapy are one component of a multi-disciplinary discharge planning process, led by the attending physician.  Recommendations may be updated based on patient status, additional functional criteria and insurance authorization.    Follow Up Recommendations  Skilled nursing-short term rehab (<3 hours/day)    Assistance Recommended at Discharge Frequent or constant Supervision/Assistance  Patient can return home with the following  A lot of help with walking and/or transfers;A lot of help with bathing/dressing/bathroom;Assistance with  cooking/housework;Assistance with feeding;Direct supervision/assist for medications management;Direct supervision/assist for financial management;Assist for transportation;Help with stairs or ramp for entrance   Equipment Recommendations  None recommended by OT    Recommendations for Other Services      Precautions / Restrictions Precautions Precautions: Fall Restrictions Weight Bearing Restrictions: No       Mobility Bed Mobility Overal bed mobility: Needs Assistance Bed Mobility: Supine to Sit     Supine to sit: Mod assist, Max assist     General bed mobility comments: increased time, labored movement, repeated verbal/tactile cueing    Transfers Overall transfer level: Needs assistance   Transfers: Sit to/from Stand, Bed to chair/wheelchair/BSC Sit to Stand: Max assist Stand pivot transfers: Max assist         General transfer comment: Atttempt sit to stand with RW without success. Graded to stand pivot transfer without RW.     Balance Overall balance assessment: Needs assistance Sitting-balance support: Bilateral upper extremity supported, Feet supported Sitting balance-Leahy Scale: Fair Sitting balance - Comments: seated at EOB   Standing balance support: Bilateral upper extremity supported, During functional activity Standing balance-Leahy Scale: Poor Standing balance comment: poor with and without RW                           ADL either performed or assessed with clinical judgement   ADL Overall ADL's : Needs assistance/impaired     Grooming: Set up;Sitting;Wash/dry face Grooming Details (indicate cue type and reason): Pt able to wash face seated in chair when provided warm washcloth.             Lower Body  Dressing: Moderate assistance;Sitting/lateral leans Lower Body Dressing Details (indicate cue type and reason): Pt able to doff B LE socks with much extended time and effort. Assisted by therapist to don socks again. Toilet  Transfer: Maximal Scientist, forensic Details (indicate cue type and reason): Simualted via EOB to chair without RW.                  Cognition Arousal/Alertness: Awake/alert Behavior During Therapy: WFL for tasks assessed/performed Overall Cognitive Status: History of cognitive impairments - at baseline                                          Exercises Exercises: Other exercises Other Exercises Other Exercises: Attempted supine protraction with pt only completing once before fatiguing.                 Pertinent Vitals/ Pain       Pain Assessment Pain Assessment: No/denies pain                                                          Frequency  Min 2X/week        Progress Toward Goals  OT Goals(current goals can now be found in the care plan section)  Progress towards OT goals: Progressing toward goals  Acute Rehab OT Goals Patient Stated Goal: Pt seemingly agreeable to go to rehab to get stronger. OT Goal Formulation: With patient Time For Goal Achievement: 04/12/22 Potential to Achieve Goals: Fair ADL Goals Pt Will Perform Eating: with set-up;sitting Pt Will Perform Grooming: with min guard assist;sitting Pt Will Perform Upper Body Bathing: with min guard assist;sitting Pt Will Perform Upper Body Dressing: with min guard assist;sitting Pt Will Perform Lower Body Dressing: with min assist;sitting/lateral leans Pt Will Transfer to Toilet: with min assist;stand pivot transfer Pt/caregiver will Perform Home Exercise Program: Increased ROM;Increased strength;Both right and left upper extremity;With minimal assist  Plan Discharge plan remains appropriate                                    End of Session Equipment Utilized During Treatment: Rolling walker (2 wheels);Oxygen  OT Visit Diagnosis: Unsteadiness on feet (R26.81);Other abnormalities of gait and mobility (R26.89);Muscle  weakness (generalized) (M62.81)   Activity Tolerance Patient tolerated treatment well   Patient Left in chair;with chair alarm set;with call bell/phone within reach   Nurse Communication Other (comment) (Notified pt was ready to eat breakfast.)        Time: 3785-8850 OT Time Calculation (min): 27 min  Charges: OT General Charges $OT Visit: 1 Visit OT Treatments $Self Care/Home Management : 23-37 mins  Latona Krichbaum OT, MOT  Larey Seat 03/30/2022, 9:41 AM

## 2022-03-31 DIAGNOSIS — K922 Gastrointestinal hemorrhage, unspecified: Secondary | ICD-10-CM | POA: Diagnosis not present

## 2022-03-31 LAB — BASIC METABOLIC PANEL
Anion gap: 5 (ref 5–15)
BUN: 20 mg/dL (ref 8–23)
CO2: 28 mmol/L (ref 22–32)
Calcium: 8.3 mg/dL — ABNORMAL LOW (ref 8.9–10.3)
Chloride: 104 mmol/L (ref 98–111)
Creatinine, Ser: 0.85 mg/dL (ref 0.44–1.00)
GFR, Estimated: >60 mL/min (ref >60)
Glucose, Bld: 104 mg/dL — ABNORMAL HIGH (ref 70–99)
Potassium: 2.9 mmol/L — ABNORMAL LOW (ref 3.5–5.1)
Sodium: 137 mmol/L (ref 135–145)

## 2022-03-31 LAB — CBC
HCT: 24.8 % — ABNORMAL LOW (ref 36.0–46.0)
Hemoglobin: 8 g/dL — ABNORMAL LOW (ref 12.0–15.0)
MCH: 29.6 pg (ref 26.0–34.0)
MCHC: 32.3 g/dL (ref 30.0–36.0)
MCV: 91.9 fL (ref 80.0–100.0)
Platelets: 265 10*3/uL (ref 150–400)
RBC: 2.7 MIL/uL — ABNORMAL LOW (ref 3.87–5.11)
RDW: 14.4 % (ref 11.5–15.5)
WBC: 7.7 10*3/uL (ref 4.0–10.5)
nRBC: 0 % (ref 0.0–0.2)

## 2022-03-31 LAB — MAGNESIUM: Magnesium: 1.7 mg/dL (ref 1.7–2.4)

## 2022-03-31 MED ORDER — POTASSIUM CHLORIDE 20 MEQ PO PACK
40.0000 meq | PACK | Freq: Once | ORAL | Status: AC
  Administered 2022-03-31: 40 meq via ORAL
  Filled 2022-03-31: qty 2

## 2022-03-31 MED ORDER — POTASSIUM CHLORIDE 10 MEQ/100ML IV SOLN
10.0000 meq | INTRAVENOUS | Status: AC
  Administered 2022-03-31 (×3): 10 meq via INTRAVENOUS
  Filled 2022-03-31 (×2): qty 100

## 2022-03-31 MED ORDER — POTASSIUM CHLORIDE 10 MEQ/100ML IV SOLN
INTRAVENOUS | Status: AC
  Filled 2022-03-31: qty 100

## 2022-03-31 NOTE — Care Management Important Message (Signed)
Important Message  Patient Details  Name: Kelsey Thompson MRN: [id number] Date of Birth: August 07, 1920   Medicare Important Message Given:  Yes     Tommy Medal 03/31/2022, 11:22 AM

## 2022-03-31 NOTE — Progress Notes (Signed)
Physical Therapy Treatment Patient Details Name: Kelsey Thompson MRN: [id number] DOB: [birth year] Today's Date: 03/31/2022   History of Present Illness Kelsey Thompson is a 88 y.o. female with medical history significant for paroxysmal atrial fibrillation on anticoagulation with Eliquis, pacemaker placement, prior CVA, hypertension, dyslipidemia, GERD, and dementia who was brought to the ED for evaluation of hematemesis that began earlier today.  She was noted to have some blood on her pants as well as in the trash can this morning.  She denies any significant abdominal pain, but continues to have some retching.  No fevers, chills, or diarrhea noted.  Patient is on a liquid diet at her facility and has had some weight loss recently.  Last dose of Eliquis noted to be this morning.    PT Comments    Patient demonstrates slow labored movement for sitting up at bedside with increased strength for moving extremities requiring slightly less assistance, fair/good sitting balance supporting self with BUE while completing BLE ROM/strengthening exercises without loss of balance and able to take a few slow labored side steps using RW with Max assist.  Patient tolerated sitting up in chair after therapy.  Patient will benefit from continued skilled physical therapy in hospital and recommended venue below to increase strength, balance, endurance for safe ADLs and gait.       Recommendations for follow up therapy are one component of a multi-disciplinary discharge planning process, led by the attending physician.  Recommendations may be updated based on patient status, additional functional criteria and insurance authorization.  Follow Up Recommendations  Skilled nursing-short term rehab (<3 hours/day) Can patient physically be transported by private vehicle: No   Assistance Recommended at Discharge Intermittent Supervision/Assistance  Patient can return home with the following A lot of help with  bathing/dressing/bathroom;A lot of help with walking and/or transfers;Help with stairs or ramp for entrance;Assistance with cooking/housework   Equipment Recommendations  None recommended by PT    Recommendations for Other Services       Precautions / Restrictions Precautions Precautions: Fall Restrictions Weight Bearing Restrictions: No     Mobility  Bed Mobility Overal bed mobility: Needs Assistance Bed Mobility: Supine to Sit     Supine to sit: Mod assist     General bed mobility comments: increased time, labored movement with improvement for using BUE to support self    Transfers Overall transfer level: Needs assistance Equipment used: Rolling walker (2 wheels) Transfers: Sit to/from Stand, Bed to chair/wheelchair/BSC Sit to Stand: Mod assist, Max assist   Step pivot transfers: Mod assist, Max assist       General transfer comment: slow labored movement with leaning over RW    Ambulation/Gait Ambulation/Gait assistance: Max assist Gait Distance (Feet): 3 Feet Assistive device: Rolling walker (2 wheels) Gait Pattern/deviations: Decreased step length - right, Decreased step length - left, Decreased stride length, Knees buckling, Trunk flexed Gait velocity: slow     General Gait Details: limited to a few slow labored unsteady side steps using RW with flexed trunk and diffiuclty holding onto RW with left hand   Stairs             Wheelchair Mobility    Modified Rankin (Stroke Patients Only)       Balance Overall balance assessment: Needs assistance Sitting-balance support: Feet supported, Bilateral upper extremity supported Sitting balance-Leahy Scale: Fair Sitting balance - Comments: fair/good seated at EOB with supporting self with BUE, fair/poor when not supporting self with BUE   Standing balance  support: During functional activity, Reliant on assistive device for balance, Bilateral upper extremity supported Standing balance-Leahy Scale:  Poor Standing balance comment: using RW                            Cognition Arousal/Alertness: Awake/alert Behavior During Therapy: WFL for tasks assessed/performed Overall Cognitive Status: History of cognitive impairments - at baseline                                          Exercises General Exercises - Lower Extremity Long Arc Quad: Seated, AROM, Strengthening, Both, 10 reps Hip Flexion/Marching: Seated, AROM, Strengthening, Both, 10 reps Toe Raises: Seated, AROM, Strengthening, Both, 10 reps Heel Raises: Seated, AROM, Strengthening, Both, 10 reps    General Comments        Pertinent Vitals/Pain Pain Assessment Pain Assessment: No/denies pain    Home Living                          Prior Function            PT Goals (current goals can now be found in the care plan section) Acute Rehab PT Goals Patient Stated Goal: return home PT Goal Formulation: With patient Time For Goal Achievement: 04/12/22 Potential to Achieve Goals: Good Progress towards PT goals: Progressing toward goals    Frequency    Min 3X/week      PT Plan Current plan remains appropriate    Co-evaluation              AM-PAC PT "6 Clicks" Mobility   Outcome Measure  Help needed turning from your back to your side while in a flat bed without using bedrails?: A Lot Help needed moving from lying on your back to sitting on the side of a flat bed without using bedrails?: A Lot Help needed moving to and from a bed to a chair (including a wheelchair)?: A Lot Help needed standing up from a chair using your arms (e.g., wheelchair or bedside chair)?: A Lot Help needed to walk in hospital room?: A Lot Help needed climbing 3-5 steps with a railing? : Total 6 Click Score: 11    End of Session   Activity Tolerance: Patient tolerated treatment well;Patient limited by fatigue Patient left: in chair;with call bell/phone within reach Nurse Communication:  Mobility status PT Visit Diagnosis: Unsteadiness on feet (R26.81);Other abnormalities of gait and mobility (R26.89);Muscle weakness (generalized) (M62.81)     Time: [id number] PT Time Calculation (min) (ACUTE ONLY): 30 min  Charges:  $Therapeutic Exercise: 8-22 mins $Therapeutic Activity: 8-22 mins                     12:32 PM, 03/31/22 Lonell Grandchild, MPT Physical Therapist with [facility] [phone] office [phone] mobile phone

## 2022-03-31 NOTE — Progress Notes (Signed)
Chart reviewed.  85 88-year-old female with chronic A-fib on Eliquis brought to the ED by EMS 9/23 from SNF with hematemesis, progressive solid food dysphagia, reported weight loss over the last year.  Hemoglobin was 9.4, down from 12.8 previously, Hemoccult positive, but overall remained hemodynamically stable.  GI consulted for further evaluation and management.  EGD 9/23 with intubation revealed large double-barreled Zenker's diverticulum markedly displacing the esophageal lumen, active bleeding with large amount of clot in the esophagus.  Exam was incomplete however esophageal ulceration was seen.  Hemospray deployed.  Moderate size hiatal hernia.  She was started on PPI infusion.  Repeat EGD 9/25 with Zenker's diverticulum, grade D esophagitis in the lower third of the esophagus, bleeding Mallory-Weiss tear with stigmata of recent bleeding in the distal esophagus injected with epinephrine and treated with bipolar cautery.  Hematin in the entire stomach.  PPI infusion for 72 hours and was extubated on 9/26.  She has been evaluated by speech therapy and noted to have slow lingual movement resulting in delayed oral transit with pure, otherwise no overt signs of aspiration or globus sensation.  She underwent MBSS 03/30/22 noting minimal oropharyngeal dysphagia and primary pharyngeal esophageal dysphagia with minimally delayed oral transit with pures and solids with minimal lingual residue.  Impaired anterior posterior transit of barium tablet which she chewed up with swallowed.  She exhibited minimal pooling after swallowing that was cleared with independent repeat/dry swallowing.  No backflow into the pharynx was observed despite cued coughing.  They recommended dysphagia 2/5 chopped and thin liquids as well as sitting upright for for all meals and remaining upright for 30 minutes after.  Despite evidence of Zenker's diverticulum, she is not a surgical candidate.  Her daughter does not wish for her  mother to have surgery or a feeding tube.  She had drops of hemoglobin during her stay but has not required any blood transfusion.  Her hemoglobin is stable in the 8-9 range.  Recommendation: Continue PPI twice daily on discharge Follow dysphagia 2/1 chopped and thin liquid diet per speech therapy Can consider repeat EGD with dilation if needed Follow-up outpatient  GI will sign off, thank you for allowing [place] to participate in the care of Kelsey Thompson. Re-consult as appropriate.   Venetia Night, MSN, APRN, FNP-BC, AGACNP-BC [facility]

## 2022-03-31 NOTE — Progress Notes (Signed)
PROGRESS NOTE    MARTA BOUIE  PXT:[id number] DOB: [birth year] DOA: 03/25/2022 PCP: Claretta Fraise, MD   Brief Narrative:    Kelsey Thompson is a 88 y.o. female with medical history significant for paroxysmal atrial fibrillation on anticoagulation with Eliquis, pacemaker placement, prior CVA, hypertension, dyslipidemia, GERD, and dementia who was brought to the ED for evaluation of hematemesis that began earlier on 9/23 at her ALF.  She had her Eliquis reversed and underwent urgent EGD on 9/23 with findings of Zenker's diverticulum with large blood clot and signs of active bleeding.  She had Hemospray performed and has plans for repeat EGD by 9/25 and was intubated after the procedure.  She was extubated on 9/26.  She currently remains on PPI infusion.  PT/OT recommending SNF placement and she will not be able to go to [facility] until 10/2 per TOC.  Assessment & Plan:   Principal Problem:   GI bleed Active Problems:   Essential hypertension   GERD   Cardiac pacemaker in situ   Embolic stroke (HCC) R brain s/p tPA d/t AF   Paroxysmal atrial fibrillation (HCC)   Hyperlipidemia LDL goal <70   Hemiparesis affecting left side as late effect of cerebrovascular accident (CVA) ([place])   Hematemesis   Acute hypoxemic respiratory failure (HCC)   Dysphagia  Assessment and Plan:   Acute blood loss anemia secondary to upper GI bleed Status post EGD 9/23 with findings of large adherent clot and active bleeding with Zenker's diverticulum, repeat EGD performed 9/25 Transfuse for hemoglobin less than 7, currently stable IV fluid discontinued PPI infusion now changed to PPI twice daily Appreciate ongoing GI evaluation SLP recommended dysphagia 2 diet after modified barium swallow 9/28, but she is still having trouble with choking and will keep on dysphagia 1 diet for now Holding home Eliquis with reversal on 9/23; continue to hold Eliquis until 10/2 per GI recommendations Hemoglobin and hematocrit  with mild drop; recheck CBC in am; no overt bleed   PAF with PPM on chronic anticoagulation Holding Eliquis until 10/2 per GI recommendations Continue telemetry monitoring   Hypokalemia Replete and reevaluate in a.m.   Prior CVA   Hypertension Hold antihypertensives IV hydralazine as needed for severe elevations   Dyslipidemia Hold statin   GERD IV PPI infusion as ordered   Dementia with behavioral disturbances Hold donepezil Hold Seroquel Likely candidate for palliative/hospice in the very near future     DVT prophylaxis:SCDs, ok to resume Eliquis 10/2 Code Status: DNR Family Communication: Discussed with daughter [name] 9/27 Disposition Plan:  Status is: Inpatient Remains inpatient appropriate because: Intubation/IV meds.   Consultants:  GI Pulmonology   Procedures:  EGD 9/23 Intubation 9/23   Antimicrobials:  None    Subjective: Patient seen and evaluated today with no new acute complaints or concerns. No acute concerns or events noted overnight.  Objective: Vitals:   03/30/22 2138 03/31/22 0209 03/31/22 0442 03/31/22 1325  BP: (!) 109/59 (!) 111/51 119/61 110/61  Pulse: 75 78 (!) 57 61  Resp: 20 20 20 19   Temp: 98.2 F (36.8 C) (!) 97.5 F (36.4 C) 97.7 F (36.5 C) 97.8 F (36.6 C)  TempSrc:  Oral  Oral  SpO2: 91% 92% 97% 95%  Weight:      Height:        Intake/Output Summary (Last 24 hours) at 03/31/2022 1442 Last data filed at 03/31/2022 1300 Gross per 24 hour  Intake 434.16 ml  Output --  Net 434.16 ml  Filed Weights   03/26/22 0433 03/28/22 0019 03/30/22 0500  Weight: 49.9 kg 54.8 kg 52.4 kg    Examination:  General exam: Appears calm and comfortable  Respiratory system: Clear to auscultation. Respiratory effort normal. Cardiovascular system: S1 & S2 heard, RRR.  Gastrointestinal system: Abdomen is soft Central nervous system: Somnolent Extremities: No edema Skin: No significant lesions noted Psychiatry: Flat  affect.    Data Reviewed: I have personally reviewed following labs and imaging studies  CBC: Recent Labs  Lab 03/27/22 0421 03/28/22 0401 03/29/22 1155 03/30/22 0539 03/31/22 0601  WBC 7.3 7.1 10.6* 10.6* 7.7  HGB 8.3* 8.6* 9.0* 9.1* 8.0*  HCT 25.2* 26.4* 27.9* 28.8* 24.8*  MCV 92.3 92.3 95.9 97.6 91.9  PLT 231 227 220 238 265   Basic Metabolic Panel: Recent Labs  Lab 03/27/22 0421 03/28/22 0401 03/29/22 1155 03/30/22 0539 03/31/22 0601  NA 136 137 136 136 137  K 3.7 3.5 3.5 3.3* 2.9*  CL 106 107 103 105 104  CO2 26 26 24 25 28   GLUCOSE 105* 121* 279* 116* 104*  BUN 19 13 11 13 20   CREATININE 0.86 0.71 0.76 0.77 0.85  CALCIUM 8.2* 8.1* 8.2* 8.2* 8.3*  MG 1.8 1.7 1.7 1.7 1.7   GFR: Estimated Creatinine Clearance: 29.8 mL/min (by C-G formula based on SCr of 0.85 mg/dL). Liver Function Tests: Recent Labs  Lab 03/25/22 1005 03/26/22 0352 03/27/22 0421  AST 20 17 12*  ALT 20 20 16   ALKPHOS 57 48 44  BILITOT 0.6 0.5 0.4  PROT 6.3* 5.3* 4.9*  ALBUMIN 3.1* 2.6* 2.3*   No results for input(s): "LIPASE", "AMYLASE" in the last 168 hours. No results for input(s): "AMMONIA" in the last 168 hours. Coagulation Profile: Recent Labs  Lab 03/25/22 1005  INR 1.3*   Cardiac Enzymes: No results for input(s): "CKTOTAL", "CKMB", "CKMBINDEX", "TROPONINI" in the last 168 hours. BNP (last 3 results) No results for input(s): "PROBNP" in the last 8760 hours. HbA1C: No results for input(s): "HGBA1C" in the last 72 hours. CBG: Recent Labs  Lab 03/26/22 0736 03/26/22 1119 03/26/22 2001 03/27/22 0031 03/27/22 0410  GLUCAP 126* 126* 141* 123* 107*   Lipid Profile: No results for input(s): "CHOL", "HDL", "LDLCALC", "TRIG", "CHOLHDL", "LDLDIRECT" in the last 72 hours. Thyroid Function Tests: No results for input(s): "TSH", "T4TOTAL", "FREET4", "T3FREE", "THYROIDAB" in the last 72 hours. Anemia Panel: No results for input(s): "VITAMINB12", "FOLATE", "FERRITIN", "TIBC",  "IRON", "RETICCTPCT" in the last 72 hours. Sepsis Labs: No results for input(s): "PROCALCITON", "LATICACIDVEN" in the last 168 hours.  No results found for this or any previous visit (from the past 240 hour(s)).       Radiology Studies: DG Swallowing Func-Speech Pathology  Result Date: 03/30/2022 Table formatting from the original result was not included. Objective Swallowing Evaluation: Type of Study: MBS-Modified Barium Swallow Study  Patient Details Name: MAHAYLA HADDAWAY MRN: 03/29/22 Date of Birth: 07-Jan-1921 Today's Date: 03/30/2022 Time: SLP Start Time (ACUTE ONLY): 1405 -SLP Stop Time (ACUTE ONLY): 1433 SLP Time Calculation (min) (ACUTE ONLY): 28 min Past Medical History: Past Medical History: Diagnosis Date  Arthritis   Bradycardia   GERD (gastroesophageal reflux disease)   Hiatal hernia   Hypercholesterolemia   Hypertension   Pacemaker   Stroke [facility])  Past Surgical History: Past Surgical History: Procedure Laterality Date  APPENDECTOMY    CESAREAN SECTION    PACEMAKER INSERTION    TONSILLECTOMY   HPI: AIDEL DAVISSON is a 88 y.o. female with  medical history significant for paroxysmal atrial fibrillation on anticoagulation with Eliquis, pacemaker placement, prior CVA, hypertension, dyslipidemia, GERD, and dementia who was brought to the ED for evaluation of hematemesis that began earlier on 9/23 at her ALF.  She had her Eliquis reversed and underwent urgent EGD on 9/23 with findings of Zenker's diverticulum with large blood clot and signs of active bleeding.  She had Hemospray performed and has plans for repeat EGD by 9/25 and was intubated after the procedure.  She was extubated on 9/26.  She currently remains on PPI infusion. BSE requested.  Subjective: "I think I just had xrays of my teeth last week."  Recommendations for follow up therapy are one component of a multi-disciplinary discharge planning process, led by the attending physician.  Recommendations may be updated based on patient  status, additional functional criteria and insurance authorization. Assessment / Plan / Recommendation   03/30/2022   3:00 PM Clinical Impressions Clinical Impression Pt presents with min oropharyngeal dysphagia and primary pharyngoesophageal dysphagia characterized by min delayed oral transit with piecemeal deglutition of purees and solids with min lingual residue, impaired anterior posterior transit of barium tablet and Pt ended up chewing up the barium tablet; swallow trigger at the level of the valleculae with cup sips of thin and at the level of the pyriforms with straw sips. She exhibited min vallecular and lateral channel pooling after the swallow when consuming mixed consistencies (chewing the pill and swallowing thins), however this cleared with independent repeat/dry swallow. Pt had Zenker's diverticulum identified on her recent EGD and this was observed in the MBSS which resulted in retention of thins and puree. No backflow into the pharynx was observed despite cued coughing to see if this would elicit. Pt did not consume a great amount however and retrograde movement possible over the course of a meal. Recommend D2/fine chop and thin liquids and ensure that Pt is alert and upright for all eating and drinking and remain upright for at least 30 minutes after. No further SLP services indicated at this time. Above reviewed with RN. SLP Visit Diagnosis Dysphagia, pharyngoesophageal phase (R13.14) Impact on safety and function Mild aspiration risk;Risk for inadequate nutrition/hydration     03/30/2022   3:00 PM Treatment Recommendations Treatment Recommendations No treatment recommended at this time     03/30/2022   3:00 PM Prognosis Prognosis for Safe Diet Advancement Fair Barriers to Reach Goals Severity of deficits   03/30/2022   3:00 PM Diet Recommendations SLP Diet Recommendations Dysphagia 2 (Fine chop) solids;Thin liquid Liquid Administration via Cup;Straw Medication Administration Crushed with puree  Compensations Slow rate;Small sips/bites Postural Changes Remain semi-upright after after feeds/meals (Comment);Seated upright at 90 degrees     03/30/2022   3:00 PM Other Recommendations Oral Care Recommendations Oral care BID Other Recommendations Clarify dietary restrictions Follow Up Recommendations No SLP follow up Assistance recommended at discharge Frequent or constant Supervision/Assistance Functional Status Assessment Patient has had a recent decline in their functional status and demonstrates the ability to make significant improvements in function in a reasonable and predictable amount of time.   03/29/2022  12:00 PM Frequency and Duration  Speech Therapy Frequency (ACUTE ONLY) min 2x/week Treatment Duration 1 week     03/30/2022   3:00 PM Oral Phase Oral Phase Impaired Oral - Puree Piecemeal swallowing;Lingual/palatal residue Oral - Regular Delayed oral transit;Piecemeal swallowing;Lingual/palatal residue Oral - Pill --    03/30/2022   3:00 PM Pharyngeal Phase Pharyngeal Phase Impaired Pharyngeal- Thin Teaspoon Delayed swallow initiation-vallecula Pharyngeal-  Thin Cup Delayed swallow initiation-vallecula;Pharyngeal residue - valleculae;Penetration/Aspiration before swallow Pharyngeal Material enters airway, remains ABOVE vocal cords then ejected out Pharyngeal- Thin Straw Delayed swallow initiation-pyriform sinuses;Pharyngeal residue - valleculae;Pharyngeal residue - pyriform Pharyngeal- Puree WFL Pharyngeal- Regular WFL Pharyngeal- Pill NT    03/30/2022   3:00 PM Cervical Esophageal Phase  Cervical Esophageal Phase Impaired Thin Cup Other (Comment) Cervical Esophageal Comment Zenker's diverticulum Thank you, Havery Moros, CCC-SLP [phone] PORTER,DABNEY 03/30/2022, 4:15 PM                          Scheduled Meds:  Chlorhexidine Gluconate Cloth  6 each Topical Daily   mouth rinse  15 mL Mouth Rinse 4 times per day   pantoprazole (PROTONIX) IV  40 mg Intravenous Q12H     LOS: 6 days    Time  spent: 35 minutes    Fabricio Endsley Hoover Brunette, DO [facility]  If 7PM-7AM, please contact night-coverage [email or web address] 03/31/2022, 2:42 PM

## 2022-04-01 DIAGNOSIS — K922 Gastrointestinal hemorrhage, unspecified: Secondary | ICD-10-CM | POA: Diagnosis not present

## 2022-04-01 DIAGNOSIS — I69354 Hemiplegia and hemiparesis following cerebral infarction affecting left non-dominant side: Secondary | ICD-10-CM

## 2022-04-01 DIAGNOSIS — J9601 Acute respiratory failure with hypoxia: Secondary | ICD-10-CM | POA: Diagnosis not present

## 2022-04-01 DIAGNOSIS — K920 Hematemesis: Secondary | ICD-10-CM | POA: Diagnosis not present

## 2022-04-01 DIAGNOSIS — I480 Paroxysmal atrial fibrillation: Secondary | ICD-10-CM

## 2022-04-01 LAB — CBC
HCT: 23.8 % — ABNORMAL LOW (ref 36.0–46.0)
Hemoglobin: 7.8 g/dL — ABNORMAL LOW (ref 12.0–15.0)
MCH: 30 pg (ref 26.0–34.0)
MCHC: 32.8 g/dL (ref 30.0–36.0)
MCV: 91.5 fL (ref 80.0–100.0)
Platelets: 304 10*3/uL (ref 150–400)
RBC: 2.6 MIL/uL — ABNORMAL LOW (ref 3.87–5.11)
RDW: 14.6 % (ref 11.5–15.5)
WBC: 8.5 10*3/uL (ref 4.0–10.5)
nRBC: 0 % (ref 0.0–0.2)

## 2022-04-01 LAB — BASIC METABOLIC PANEL
Anion gap: 7 (ref 5–15)
BUN: 22 mg/dL (ref 8–23)
CO2: 27 mmol/L (ref 22–32)
Calcium: 8.6 mg/dL — ABNORMAL LOW (ref 8.9–10.3)
Chloride: 103 mmol/L (ref 98–111)
Creatinine, Ser: 0.89 mg/dL (ref 0.44–1.00)
GFR, Estimated: 58 mL/min — ABNORMAL LOW (ref >60)
Glucose, Bld: 115 mg/dL — ABNORMAL HIGH (ref 70–99)
Potassium: 4 mmol/L (ref 3.5–5.1)
Sodium: 137 mmol/L (ref 135–145)

## 2022-04-01 LAB — MAGNESIUM: Magnesium: 1.8 mg/dL (ref 1.7–2.4)

## 2022-04-01 MED ORDER — METOPROLOL TARTRATE 25 MG PO TABS
12.5000 mg | ORAL_TABLET | Freq: Two times a day (BID) | ORAL | Status: DC
  Administered 2022-04-01 – 2022-04-03 (×5): 12.5 mg via ORAL
  Filled 2022-04-01 (×6): qty 1

## 2022-04-01 NOTE — Progress Notes (Addendum)
PROGRESS NOTE    Kelsey Thompson  OVZ:[id number] DOB: 1920-10-13 DOA: 03/25/2022 PCP: Claretta Fraise, MD   Brief Narrative:    Kelsey Thompson is a 88 y.o. female with medical history significant for paroxysmal atrial fibrillation on anticoagulation with Eliquis, pacemaker placement, prior CVA, hypertension, dyslipidemia, GERD, and dementia who was brought to the ED for evaluation of hematemesis that began earlier on 9/23 at her ALF.  She had her Eliquis reversed and underwent urgent EGD on 9/23 with findings of Zenker's diverticulum with large blood clot and signs of active bleeding.  She had Hemospray performed and has plans for repeat EGD by 9/25 and was intubated after the procedure.  She was extubated on 9/26.  She currently remains on PPI infusion.  PT/OT recommending SNF placement and she will not be able to go to [facility] until 10/2 per TOC.  Assessment & Plan:   Principal Problem:   GI bleed Active Problems:   Essential hypertension   GERD   Cardiac pacemaker in situ   Embolic stroke (HCC) R brain s/p tPA d/t AF   Paroxysmal atrial fibrillation (HCC)   Hyperlipidemia LDL goal <70   Hemiparesis affecting left side as late effect of cerebrovascular accident (CVA) ([place])   Hematemesis   Acute hypoxemic respiratory failure ([place])   Dysphagia  Assessment and Plan: 1)Acute blood loss anemia secondary to upper GI bleed Status post EGD 9/23 with findings of large adherent clot and active bleeding with Zenker's diverticulum, repeat EGD performed 9/25  with Zenker's diverticulum, grade D esophagitis in the lower third of the esophagus, bleeding Mallory-Weiss tear with stigmata of recent bleeding in the distal esophagus injected with epinephrine and treated with bipolar cautery.  Hematin in the entire stomach. -Continue twice daily PPI dosing Baseline hemoglobin usually between 11 and 12 -Admission hemoglobin was 9.4 -No transfusions this admission -Hemoglobin currently above 7 -Monitor  Hgb closely and transfuse as indicated  2) dysphagia in the setting of prior stroke and acute GI bleed with esophagitis -- SLP recommended dysphagia 2 diet after modified barium swallow 03/30/22,     3)PAF with PPM on chronic anticoagulation Holding Eliquis until 04/03/22 per GI recommendations -low-dose metoprolol for rate control   4)H/o CVA/HLD--statin currently on hold -Given advanced age of almost 89 years -Need to discuss risk versus benefit of statin RX with patient and family  5)Hypertension -Hold amlodipine -Metoprolol as above #3 IV hydralazine as needed for severe elevations   6)Dementia with behavioral disturbances Hold donepezil Hold Seroquel -Stable overall at this time, cooperative -Consider palliative involvement if fails to improve as anticipated  7) social/ethics---DNR/DNI -Patient on daughter does Not desire feeding tube   8) generalized weakness/deconditioning and ambulatory dysfunction--PTA patient lives in an ALF with "some" independence -Now requiring a lot more assistance after intubation/extubation and GI bleed with acute on chronic anemia -PT eval appreciated recommends SNF rehab  disposition-- Peer to Peer  with INS for [facility]  you will need dob 10/15/2022   ID # [id number] ... -Successful peer-to-peer review conversation on 04/01/2022 insurance MD is approved SNF rehab at this time -Okay to discharge to SNF rehab when bed available   Procedures:- -Intubated 03/27/2022 after EGD Extubated 03/28/2022 -Initial EGD 03/25/2022 -Repeat EGD 03/27/2022 -MBBS 03/30/2022   DVT prophylaxis:SCDs, ok to resume Eliquis 04/03/22 Code Status: DNR Family Communication:   daughter Kelsey Thompson is primary contact   disposition Plan: Awaiting transfer to SNF rehab Status is: Inpatient Remains inpatient appropriate because: Awaiting transfer to SNF rehab  Consultants:  GI Pulmonology   Procedures:  EGD 9/23 Intubation 9/23   Antimicrobials:  None     Subjective: Tolerating dysphagia 1 diet well -We will try to advance to dysphagia 2 diet as recommended by speech pathologist -RN Karolee Ohs at bedside   Objective: Vitals:   03/31/22 0442 03/31/22 1325 03/31/22 2233 04/01/22 0500  BP: 119/61 110/61 134/68 139/83  Pulse: (!) 57 61 75 66  Resp: 20 19 20 20   Temp: 97.7 F (36.5 C) 97.8 F (36.6 C) 98.1 F (36.7 C) 98.3 F (36.8 C)  TempSrc:  Oral Oral Oral  SpO2: 97% 95% 91% 92%  Weight:      Height:        Intake/Output Summary (Last 24 hours) at 04/01/2022 1029 Last data filed at 04/01/2022 0937 Gross per 24 hour  Intake 1575.77 ml  Output 125 ml  Net 1450.77 ml   Filed Weights   03/26/22 0433 03/28/22 0019 03/30/22 0500  Weight: 49.9 kg 54.8 kg 52.4 kg   Physical Exam  Gen:- Awake Alert, in no acute distress , frail and elderly HEENT:- [place].AT, No sclera icterus Neck-Supple Neck,No JVD,.  Lungs-  CTAB , fair air movement bilaterally  CV- S1, S2 normal, RRR, 3/6 SM, pacemaker in situ Abd-  +ve B.Sounds, Abd Soft, No tenderness,    Extremity/Skin:- No  edema,   good pedal pulses  Psych-affect is appropriate, oriented x3, baseline cognitive and memory deficits Neuro-generalized weakness, no new focal deficits, no tremors   Data Reviewed: I have personally reviewed following labs and imaging studies  CBC: Recent Labs  Lab 03/28/22 0401 03/29/22 1155 03/30/22 0539 03/31/22 0601 04/01/22 0436  WBC 7.1 10.6* 10.6* 7.7 8.5  HGB 8.6* 9.0* 9.1* 8.0* 7.8*  HCT 26.4* 27.9* 28.8* 24.8* 23.8*  MCV 92.3 95.9 97.6 91.9 91.5  PLT 227 220 238 265 304   Basic Metabolic Panel: Recent Labs  Lab 03/28/22 0401 03/29/22 1155 03/30/22 0539 03/31/22 0601 04/01/22 0436  NA 137 136 136 137 137  K 3.5 3.5 3.3* 2.9* 4.0  CL 107 103 105 104 103  CO2 26 24 25 28 27   GLUCOSE 121* 279* 116* 104* 115*  BUN 13 11 13 20 22   CREATININE 0.71 0.76 0.77 0.85 0.89  CALCIUM 8.1* 8.2* 8.2* 8.3* 8.6*  MG 1.7 1.7 1.7 1.7 1.8    GFR: Estimated Creatinine Clearance: 28.5 mL/min (by C-G formula based on SCr of 0.89 mg/dL). Liver Function Tests: Recent Labs  Lab 03/26/22 0352 03/27/22 0421  AST 17 12*  ALT 20 16  ALKPHOS 48 44  BILITOT 0.5 0.4  PROT 5.3* 4.9*  ALBUMIN 2.6* 2.3*   CBG: Recent Labs  Lab 03/26/22 0736 03/26/22 1119 03/26/22 2001 03/27/22 0031 03/27/22 0410  GLUCAP 126* 126* 141* 123* 107*   Radiology Studies: DG Swallowing Func-Speech Pathology  Result Date: 03/30/2022 Table formatting from the original result was not included. Objective Swallowing Evaluation: Type of Study: MBS-Modified Barium Swallow Study  Patient Details Name: Kelsey Thompson MRN: 03/29/22 Date of Birth: 02-01-1921 Today's Date: 03/30/2022 Time: SLP Start Time (ACUTE ONLY): 1405 -SLP Stop Time (ACUTE ONLY): 1433 SLP Time Calculation (min) (ACUTE ONLY): 28 min Past Medical History: Past Medical History: Diagnosis Date  Arthritis   Bradycardia   GERD (gastroesophageal reflux disease)   Hiatal hernia   Hypercholesterolemia   Hypertension   Pacemaker   Stroke [facility])  Past Surgical History: Past Surgical History: Procedure Laterality Date  APPENDECTOMY    CESAREAN  SECTION    PACEMAKER INSERTION    TONSILLECTOMY   HPI: Kelsey Thompson is a 88 y.o. female with medical history significant for paroxysmal atrial fibrillation on anticoagulation with Eliquis, pacemaker placement, prior CVA, hypertension, dyslipidemia, GERD, and dementia who was brought to the ED for evaluation of hematemesis that began earlier on 9/23 at her ALF.  She had her Eliquis reversed and underwent urgent EGD on 9/23 with findings of Zenker's diverticulum with large blood clot and signs of active bleeding.  She had Hemospray performed and has plans for repeat EGD by 9/25 and was intubated after the procedure.  She was extubated on 9/26.  She currently remains on PPI infusion. BSE requested.  Subjective: "I think I just had xrays of my teeth last week."   Recommendations for follow up therapy are one component of a multi-disciplinary discharge planning process, led by the attending physician.  Recommendations may be updated based on patient status, additional functional criteria and insurance authorization. Assessment / Plan / Recommendation   03/30/2022   3:00 PM Clinical Impressions Clinical Impression Pt presents with min oropharyngeal dysphagia and primary pharyngoesophageal dysphagia characterized by min delayed oral transit with piecemeal deglutition of purees and solids with min lingual residue, impaired anterior posterior transit of barium tablet and Pt ended up chewing up the barium tablet; swallow trigger at the level of the valleculae with cup sips of thin and at the level of the pyriforms with straw sips. She exhibited min vallecular and lateral channel pooling after the swallow when consuming mixed consistencies (chewing the pill and swallowing thins), however this cleared with independent repeat/dry swallow. Pt had Zenker's diverticulum identified on her recent EGD and this was observed in the MBSS which resulted in retention of thins and puree. No backflow into the pharynx was observed despite cued coughing to see if this would elicit. Pt did not consume a great amount however and retrograde movement possible over the course of a meal. Recommend D2/fine chop and thin liquids and ensure that Pt is alert and upright for all eating and drinking and remain upright for at least 30 minutes after. No further SLP services indicated at this time. Above reviewed with RN. SLP Visit Diagnosis Dysphagia, pharyngoesophageal phase (R13.14) Impact on safety and function Mild aspiration risk;Risk for inadequate nutrition/hydration     03/30/2022   3:00 PM Treatment Recommendations Treatment Recommendations No treatment recommended at this time     03/30/2022   3:00 PM Prognosis Prognosis for Safe Diet Advancement Fair Barriers to Reach Goals Severity of deficits    03/30/2022   3:00 PM Diet Recommendations SLP Diet Recommendations Dysphagia 2 (Fine chop) solids;Thin liquid Liquid Administration via Cup;Straw Medication Administration Crushed with puree Compensations Slow rate;Small sips/bites Postural Changes Remain semi-upright after after feeds/meals (Comment);Seated upright at 90 degrees     03/30/2022   3:00 PM Other Recommendations Oral Care Recommendations Oral care BID Other Recommendations Clarify dietary restrictions Follow Up Recommendations No SLP follow up Assistance recommended at discharge Frequent or constant Supervision/Assistance Functional Status Assessment Patient has had a recent decline in their functional status and demonstrates the ability to make significant improvements in function in a reasonable and predictable amount of time.   03/29/2022  12:00 PM Frequency and Duration  Speech Therapy Frequency (ACUTE ONLY) min 2x/week Treatment Duration 1 week     03/30/2022   3:00 PM Oral Phase Oral Phase Impaired Oral - Puree Piecemeal swallowing;Lingual/palatal residue Oral - Regular Delayed oral transit;Piecemeal swallowing;Lingual/palatal residue Oral -  Pill --    03/30/2022   3:00 PM Pharyngeal Phase Pharyngeal Phase Impaired Pharyngeal- Thin Teaspoon Delayed swallow initiation-vallecula Pharyngeal- Thin Cup Delayed swallow initiation-vallecula;Pharyngeal residue - valleculae;Penetration/Aspiration before swallow Pharyngeal Material enters airway, remains ABOVE vocal cords then ejected out Pharyngeal- Thin Straw Delayed swallow initiation-pyriform sinuses;Pharyngeal residue - valleculae;Pharyngeal residue - pyriform Pharyngeal- Puree WFL Pharyngeal- Regular WFL Pharyngeal- Pill NT    03/30/2022   3:00 PM Cervical Esophageal Phase  Cervical Esophageal Phase Impaired Thin Cup Other (Comment) Cervical Esophageal Comment Zenker's diverticulum Thank you, Havery Moros, CCC-SLP [phone] PORTER,DABNEY 03/30/2022, 4:15 PM                      Scheduled Meds:  mouth  rinse  15 mL Mouth Rinse 4 times per day   pantoprazole (PROTONIX) IV  40 mg Intravenous Q12H    LOS: 7 days   Shon Hale, MD [facility]  If 7PM-7AM, please contact night-coverage [email or web address] 04/01/2022, 10:29 AM

## 2022-04-01 NOTE — Plan of Care (Signed)
  Problem: Education: Goal: Knowledge of General Education information will improve Description Including pain rating scale, medication(s)/side effects and non-pharmacologic comfort measures Outcome: Progressing   Problem: Health Behavior/Discharge Planning: Goal: Ability to manage health-related needs will improve Outcome: Progressing   

## 2022-04-02 DIAGNOSIS — K920 Hematemesis: Secondary | ICD-10-CM | POA: Diagnosis not present

## 2022-04-02 DIAGNOSIS — Z950 Presence of cardiac pacemaker: Secondary | ICD-10-CM

## 2022-04-02 DIAGNOSIS — J9601 Acute respiratory failure with hypoxia: Secondary | ICD-10-CM | POA: Diagnosis not present

## 2022-04-02 DIAGNOSIS — K922 Gastrointestinal hemorrhage, unspecified: Secondary | ICD-10-CM | POA: Diagnosis not present

## 2022-04-02 MED ORDER — TRAZODONE HCL 50 MG PO TABS: 100.0000 mg | ORAL_TABLET | Freq: Every day | ORAL | Status: DC

## 2022-04-02 MED ORDER — PANTOPRAZOLE SODIUM 40 MG PO TBEC
40.0000 mg | DELAYED_RELEASE_TABLET | Freq: Two times a day (BID) | ORAL | Status: DC
  Administered 2022-04-02 – 2022-04-03 (×3): 40 mg via ORAL
  Filled 2022-04-02 (×3): qty 1

## 2022-04-02 MED ORDER — MIRTAZAPINE 15 MG PO TABS
7.5000 mg | ORAL_TABLET | Freq: Every day | ORAL | Status: DC
  Administered 2022-04-02 – 2022-04-03 (×2): 7.5 mg via ORAL
  Filled 2022-04-02 (×2): qty 1

## 2022-04-02 MED ORDER — TRAZODONE HCL 50 MG PO TABS
100.0000 mg | ORAL_TABLET | Freq: Every day | ORAL | Status: AC
  Administered 2022-04-02 – 2022-04-03 (×2): 100 mg via ORAL
  Filled 2022-04-02 (×2): qty 2

## 2022-04-02 NOTE — Progress Notes (Signed)
PROGRESS NOTE    Kelsey Thompson  IOE:[id number] DOB: 1921/04/12 DOA: 03/25/2022 PCP: Claretta Fraise, MD   Brief Narrative:    Kelsey Thompson is a 88 y.o. female with medical history significant for paroxysmal atrial fibrillation on anticoagulation with Eliquis, pacemaker placement, prior CVA, hypertension, dyslipidemia, GERD, and dementia who was brought to the ED for evaluation of hematemesis that began earlier on 9/23 at her ALF.  She had her Eliquis reversed and underwent urgent EGD on 9/23 with findings of Zenker's diverticulum with large blood clot and signs of active bleeding.  She had Hemospray performed and has plans for repeat EGD by 9/25 and was intubated after the procedure.  She was extubated on 9/26.  She currently remains on PPI infusion.  PT/OT recommending SNF placement and she will not be able to go to [facility] until 10/2 per TOC.  Assessment & Plan:   Principal Problem:   GI bleed Active Problems:   Essential hypertension   GERD   Cardiac pacemaker in situ   Embolic stroke (HCC) R brain s/p tPA d/t AF   Paroxysmal atrial fibrillation (HCC)   Hyperlipidemia LDL goal <70   Hemiparesis affecting left side as late effect of cerebrovascular accident (CVA) ([place])   Hematemesis   Acute hypoxemic respiratory failure ([place])   Dysphagia  Assessment and Plan: 1)Acute blood loss anemia secondary to upper GI bleed Status post EGD 9/23 with findings of large adherent clot and active bleeding with Zenker's diverticulum, repeat EGD performed 9/25  with Zenker's diverticulum, grade D esophagitis in the lower third of the esophagus, bleeding Mallory-Weiss tear with stigmata of recent bleeding in the distal esophagus injected with epinephrine and treated with bipolar cautery.  Hematin in the entire stomach. -Continue twice daily PPI dosing Baseline hemoglobin usually between 11 and 12 -Admission hemoglobin was 9.4 -No transfusions this admission -Hemoglobin currently above 7 -Monitor  Hgb closely and transfuse as indicated  2)Dysphagia in the setting of prior stroke and acute GI bleed with esophagitis -- SLP recommended dysphagia 2 diet after modified barium swallow 03/30/22, 04/02/22  -Daughter [name] is at bedside She request Dysphagia 1 Diet (Pureed), rather than Dysphagia 2 diet which was rec by speech therapist due to concerns about prior h/o aspiration/dysphagia   3)PAF with PPM on chronic anticoagulation Holding Eliquis until 04/03/22 per GI recommendations -low-dose metoprolol for rate control   4)H/o CVA/HLD--statin currently on hold -Given advanced age of almost 89 years -Need to discuss risk versus benefit of statin RX with patient and family  5)Hypertension -Hold amlodipine -Metoprolol as above #3 IV hydralazine as needed for severe elevations   6)Dementia with behavioral disturbances Hold donepezil Hold Seroquel -Stable overall at this time, cooperative -Consider palliative involvement if fails to improve as anticipated  7) social/ethics---DNR/DNI -Patient on daughter does Not desire feeding tube   8)Generalized weakness/deconditioning and ambulatory dysfunction--PTA patient lives in an ALF with "some" independence -Now requiring a lot more assistance after intubation/extubation and GI bleed with acute on chronic anemia -PT eval appreciated recommends SNF rehab  disposition-- Peer to Peer  with INS for [facility]  you will need dob 2023-05-07   ID # [id number] ... -Successful peer-to-peer review conversation on 04/01/2022 insurance MD is approved SNF rehab at this time -Okay to discharge to SNF rehab when bed available   Procedures:- -Intubated 03/27/2022 after EGD Extubated 03/28/2022 -Initial EGD 03/25/2022 -Repeat EGD 03/27/2022 -MBBS 03/30/2022   DVT prophylaxis:SCDs, ok to resume Eliquis 04/03/22 Code Status: DNR Family Communication:  Discussed with daughter [name] at bedside on 04/02/22  disposition Plan: Awaiting transfer to SNF rehab Status is:  Inpatient Remains inpatient appropriate because: Awaiting transfer to SNF rehab Consultants:  GI Pulmonology   Procedures:  EGD 9/23 Intubation 9/23   Antimicrobials:  None    Subjective: - Daughter [name] is at bedside She request Dysphagia 1 Diet (Pureed), rather than Dysphagia 2 diet which was rec by speech therapist due to concerns about prior h/o aspiration/dysphagia - No fevers, no emesis  Objective: Vitals:   04/01/22 1331 04/01/22 2030 04/02/22 0442 04/02/22 1441  BP: (!) 149/90 (!) 128/58 (!) 147/58 126/68  Pulse: 72 65 67 66  Resp: 18 20 18 18   Temp: 97.9 F (36.6 C) 98.9 F (37.2 C)  98.6 F (37 C)  TempSrc: Axillary Oral  Oral  SpO2: 95% 94% 93% 94%  Weight:      Height:        Intake/Output Summary (Last 24 hours) at 04/02/2022 1645 Last data filed at 04/02/2022 1300 Gross per 24 hour  Intake 120 ml  Output 500 ml  Net -380 ml   Filed Weights   03/26/22 0433 03/28/22 0019 03/30/22 0500  Weight: 49.9 kg 54.8 kg 52.4 kg   Physical Exam  Gen:- Awake Alert, in no acute distress , frail and elderly HEENT:- [place].AT, No sclera icterus Neck-Supple Neck,No JVD,.  Lungs-  No wheezing, fair air movement bilaterally  CV- S1, S2 normal, RRR, 3/6 SM, Pacemaker in situ Abd-  +ve B.Sounds, Abd Soft, No tenderness,    Extremity/Skin:- No  edema,   good pedal pulses  Psych-affect is appropriate, oriented x3, baseline cognitive and memory deficits Neuro-Generalized weakness, no new focal deficits, no tremors  Data Reviewed: I have personally reviewed following labs and imaging studies  CBC: Recent Labs  Lab 03/28/22 0401 03/29/22 1155 03/30/22 0539 03/31/22 0601 04/01/22 0436  WBC 7.1 10.6* 10.6* 7.7 8.5  HGB 8.6* 9.0* 9.1* 8.0* 7.8*  HCT 26.4* 27.9* 28.8* 24.8* 23.8*  MCV 92.3 95.9 97.6 91.9 91.5  PLT 227 220 238 265 304   Basic Metabolic Panel: Recent Labs  Lab 03/28/22 0401 03/29/22 1155 03/30/22 0539 03/31/22 0601 04/01/22 0436  NA 137 136  136 137 137  K 3.5 3.5 3.3* 2.9* 4.0  CL 107 103 105 104 103  CO2 26 24 25 28 27   GLUCOSE 121* 279* 116* 104* 115*  BUN 13 11 13 20 22   CREATININE 0.71 0.76 0.77 0.85 0.89  CALCIUM 8.1* 8.2* 8.2* 8.3* 8.6*  MG 1.7 1.7 1.7 1.7 1.8   GFR: Estimated Creatinine Clearance: 28.5 mL/min (by C-G formula based on SCr of 0.89 mg/dL). Liver Function Tests: Recent Labs  Lab 03/27/22 0421  AST 12*  ALT 16  ALKPHOS 44  BILITOT 0.4  PROT 4.9*  ALBUMIN 2.3*   CBG: Recent Labs  Lab 03/26/22 2001 03/27/22 0031 03/27/22 0410  GLUCAP 141* 123* 107*   Radiology Studies: No results found.  Scheduled Meds:  metoprolol tartrate  12.5 mg Oral BID   mirtazapine  7.5 mg Oral QHS   mouth rinse  15 mL Mouth Rinse 4 times per day   pantoprazole (PROTONIX) IV  40 mg Intravenous Q12H   traZODone  100 mg Oral QHS    LOS: 8 days   03/28/22, MD [facility]  If 7PM-7AM, please contact night-coverage [email or web address] 04/02/2022, 4:45 PM

## 2022-04-02 NOTE — Progress Notes (Signed)
Peer to Peer completed yesterday and pt has authorization for SNF. She is approved 9/30-10/3 with update due on 10/3. Plan Josem Kaufmann ID is [id number] and Candace Cruise ID is [id number]. Plan remains for dc Monday to [facility].

## 2022-04-02 NOTE — Plan of Care (Signed)
  Problem: Education: Goal: Knowledge of General Education information will improve Description Including pain rating scale, medication(s)/side effects and non-pharmacologic comfort measures Outcome: Progressing   

## 2022-04-03 ENCOUNTER — Encounter (HOSPITAL_COMMUNITY): Payer: Self-pay | Admitting: Internal Medicine

## 2022-04-03 DIAGNOSIS — M6281 Muscle weakness (generalized): Secondary | ICD-10-CM | POA: Diagnosis not present

## 2022-04-03 DIAGNOSIS — K922 Gastrointestinal hemorrhage, unspecified: Secondary | ICD-10-CM | POA: Diagnosis not present

## 2022-04-03 DIAGNOSIS — I69354 Hemiplegia and hemiparesis following cerebral infarction affecting left non-dominant side: Secondary | ICD-10-CM | POA: Diagnosis not present

## 2022-04-03 DIAGNOSIS — R1310 Dysphagia, unspecified: Secondary | ICD-10-CM | POA: Diagnosis not present

## 2022-04-03 DIAGNOSIS — I10 Essential (primary) hypertension: Secondary | ICD-10-CM | POA: Diagnosis not present

## 2022-04-03 DIAGNOSIS — R531 Weakness: Secondary | ICD-10-CM | POA: Diagnosis not present

## 2022-04-03 DIAGNOSIS — J9601 Acute respiratory failure with hypoxia: Secondary | ICD-10-CM | POA: Diagnosis not present

## 2022-04-03 DIAGNOSIS — I480 Paroxysmal atrial fibrillation: Secondary | ICD-10-CM | POA: Diagnosis not present

## 2022-04-03 DIAGNOSIS — F32A Depression, unspecified: Secondary | ICD-10-CM | POA: Diagnosis not present

## 2022-04-03 DIAGNOSIS — Z7401 Bed confinement status: Secondary | ICD-10-CM | POA: Diagnosis not present

## 2022-04-03 DIAGNOSIS — R2689 Other abnormalities of gait and mobility: Secondary | ICD-10-CM | POA: Diagnosis not present

## 2022-04-03 DIAGNOSIS — K920 Hematemesis: Secondary | ICD-10-CM | POA: Diagnosis not present

## 2022-04-03 DIAGNOSIS — K2101 Gastro-esophageal reflux disease with esophagitis, with bleeding: Secondary | ICD-10-CM | POA: Diagnosis not present

## 2022-04-03 DIAGNOSIS — R6889 Other general symptoms and signs: Secondary | ICD-10-CM | POA: Diagnosis not present

## 2022-04-03 DIAGNOSIS — Z743 Need for continuous supervision: Secondary | ICD-10-CM | POA: Diagnosis not present

## 2022-04-03 DIAGNOSIS — D62 Acute posthemorrhagic anemia: Secondary | ICD-10-CM | POA: Diagnosis not present

## 2022-04-03 LAB — CBC
HCT: 28 % — ABNORMAL LOW (ref 36.0–46.0)
Hemoglobin: 9 g/dL — ABNORMAL LOW (ref 12.0–15.0)
MCH: 29.6 pg (ref 26.0–34.0)
MCHC: 32.1 g/dL (ref 30.0–36.0)
MCV: 92.1 fL (ref 80.0–100.0)
Platelets: 234 10*3/uL (ref 150–400)
RBC: 3.04 MIL/uL — ABNORMAL LOW (ref 3.87–5.11)
RDW: 14.5 % (ref 11.5–15.5)
WBC: 7.2 10*3/uL (ref 4.0–10.5)
nRBC: 0 % (ref 0.0–0.2)

## 2022-04-03 MED ORDER — QUETIAPINE FUMARATE 25 MG PO TABS: 12.5000 mg | ORAL_TABLET | Freq: Every day | ORAL | Status: DC

## 2022-04-03 MED ORDER — PANTOPRAZOLE SODIUM 40 MG PO TBEC: 40.0000 mg | DELAYED_RELEASE_TABLET | Freq: Two times a day (BID) | ORAL | Status: DC

## 2022-04-03 MED ORDER — METOPROLOL TARTRATE 25 MG PO TABS: 12.5000 mg | ORAL_TABLET | Freq: Two times a day (BID) | ORAL | Status: DC

## 2022-04-03 MED ORDER — TRAZODONE HCL 100 MG PO TABS: 100.0000 mg | ORAL_TABLET | Freq: Every day | ORAL | Status: DC

## 2022-04-03 NOTE — Care Management Important Message (Signed)
Important Message  Patient Details  Name: Kelsey Thompson MRN: [id number] Date of Birth: June 30, 1921   Medicare Important Message Given:  Yes (spoke with daughter  Tedra Coupe at [phone] to review letter, no additional copy needed)     Tommy Medal 04/03/2022, 1:37 PM

## 2022-04-03 NOTE — TOC Transition Note (Signed)
Transition of Care [facility]) - CM/SW Discharge Note   Patient Details  Name: Kelsey Thompson MRN: [id number] Date of Birth: 12/21/1920  [facility] CM/SW Contact:  Boneta Lucks, RN Phone Number: 04/03/2022, 1:59 PM   Clinical Narrative:   Patient discharging to [facility], RN to call report, med necessity printed.  Updated Alyse Low that family want out patient palliative to follow.  TOC to call EMS when RN is ready.  Final next level of care: Skilled Nursing Facility Barriers to Discharge: Barriers Resolved   Patient Goals and CMS Choice Patient states their goals for this hospitalization and ongoing recovery are:: going to SNF CMS Medicare.gov Compare Post Acute Care list provided to:: Patient Choice offered to / list presented to : Adult Children  Discharge Placement                Patient to be transferred to facility by: EMS Name of family member notified: [name] Patient and family notified of of transfer: 04/03/22  Discharge Plan and Services In-house Referral: Clinical Social Work   Post Acute Care Choice: [place]              Readmission Risk Interventions    03/30/2022   11:07 AM  Readmission Risk Prevention Plan  Post Dischage Appt Not Complete  Medication Screening Complete  Transportation Screening Complete

## 2022-04-03 NOTE — Progress Notes (Signed)
Report called to [place] nurse at [facility] further questions at this time, waiting for arrival of EMS.

## 2022-04-03 NOTE — Discharge Summary (Signed)
Physician Discharge Summary  Kelsey Thompson BUL:[id number] DOB: Oct 26, 1920 DOA: 03/25/2022  PCP: Mechele Claude, MD  Admit date: 03/25/2022 Discharge date: 04/03/2022  Admitted From: ALF Disposition:  SNF  Recommendations for Outpatient Follow-up:  Follow up with PCP in 1-2 weeks Please obtain BMP/CBC in one week Family requesting palliative care follow as outpatient for continued goals of care  Discharge Condition:stable CODE STATUS:DNR Diet recommendation: dysphagia 1 diet with thin liquids  Brief/Interim Summary: Kelsey Thompson is a 88 y.o. female with medical history significant for paroxysmal atrial fibrillation on anticoagulation with Eliquis, pacemaker placement, prior CVA, hypertension, dyslipidemia, GERD, and dementia who was brought to the ED for evaluation of hematemesis that began earlier on 9/23 at her ALF.  She had her Eliquis reversed and underwent urgent EGD on 9/23 with findings of Zenker's diverticulum with large blood clot and signs of active bleeding.  She had Hemospray performed and has plans for repeat EGD by 9/25 and was intubated after the procedure.  She was extubated on 9/26.  She was treated with PPI infusion.  PT/OT recommending SNF placement  Discharge Diagnoses:  Principal Problem:   GI bleed Active Problems:   Essential hypertension   GERD   Cardiac pacemaker in situ   Embolic stroke (HCC) R brain s/p tPA d/t AF   Paroxysmal atrial fibrillation (HCC)   Hyperlipidemia LDL goal <70   Hemiparesis affecting left side as late effect of cerebrovascular accident (CVA) (HCC)   Hematemesis   Acute hypoxemic respiratory failure (HCC)   Dysphagia  1)Acute blood loss anemia secondary to upper GI bleed Status post EGD 9/23 with findings of large adherent clot and active bleeding with Zenker's diverticulum, repeat EGD performed 9/25  with Zenker's diverticulum, grade D esophagitis in the lower third of the esophagus, bleeding Mallory-Weiss tear with stigmata of  recent bleeding in the distal esophagus injected with epinephrine and treated with bipolar cautery.  Hematin in the entire stomach. -Continue twice daily PPI dosing Baseline hemoglobin usually between 11 and 12 -Admission hemoglobin was 9.4 -No transfusions this admission -Hemoglobin currently above 7 (9.0) -continue to follow as outpatient   2)Dysphagia in the setting of prior stroke and acute GI bleed with esophagitis -- SLP recommended dysphagia 2 diet after modified barium swallow 03/30/22, 04/02/22  Daughter requested Dysphagia 1 Diet (Pureed), rather than Dysphagia 2 diet which was rec by speech therapist due to concerns about prior h/o aspiration/dysphagia   3)PAF with PPM on chronic anticoagulation -low-dose metoprolol for rate control -discussed restarting anticoagulation and considering her age, comorbidities and prognosis, daughter would like to hold off on restarting anticoagulation for now   4)H/o CVA/HLD--statin currently on hold -Given advanced age of almost 89 years -discussed risk/benefits of statin with daughter, and she will hold off on resuming statin   5)Hypertension -restart amlodipine -Metoprolol as above #3    6)Dementia with behavioral disturbances Resume qhs seroquel -Stable overall at this time, cooperative -Consider palliative involvement if fails to improve as anticipated   7) social/ethics---DNR/DNI -Daughter does Not desire feeding tube  -daughter requesting palliative care to follow patient after discharge and consider transitioning to hospice if she does not do well with rehab   8)Generalized weakness/deconditioning and ambulatory dysfunction--PTA patient lives in an ALF with "some" independence -Now requiring a lot more assistance after intubation/extubation and GI bleed with acute on chronic anemia -PT eval appreciated recommends SNF rehab   Disposition- -Successful peer-to-peer review conversation on 04/01/2022 insurance MD is approved SNF  rehab at this  time -Okay to discharge to SNF rehab when bed available    Discharge Instructions  Discharge Instructions     Diet - low sodium heart healthy   Complete by: As directed    Increase activity slowly   Complete by: As directed       Allergies as of 04/03/2022       Reactions   Horse-derived Products Other (See Comments)   Tetanus:Reaction unknown   Penicillins Other (See Comments)   Did it involve swelling of the face/tongue/throat, SOB, or low BP? Yes-swelling Did it involve sudden or severe rash/hives, skin peeling, or any reaction on the inside of your mouth or nose? No Did you need to seek medical attention at a hospital or doctor's office? Yes-Dr. office When did it last happen?  4 years prior     If all above answers are "NO", may proceed with cephalosporin use.   Tetanus Antitoxin    Unknown reaction        Medication List     STOP taking these medications    apixaban 2.5 MG Tabs tablet Commonly known as: ELIQUIS   donepezil 5 MG tablet Commonly known as: Aricept   enalapril 5 MG tablet Commonly known as: VASOTEC   Melatonin 3 MG Caps   rosuvastatin 5 MG tablet Commonly known as: CRESTOR       TAKE these medications    acetaminophen 500 MG tablet Commonly known as: TYLENOL Take 500 mg by mouth every 6 (six) hours as needed for mild pain or moderate pain.   amLODipine 5 MG tablet Commonly known as: NORVASC Take 1 tablet (5 mg total) by mouth daily.   ascorbic acid 500 MG tablet Commonly known as: VITAMIN C Take 500 mg by mouth daily.   calcium-vitamin D 500-200 MG-UNIT tablet Commonly known as: OSCAL WITH D Take 1 tablet by mouth daily.   cholecalciferol 25 MCG (1000 UNIT) tablet Commonly known as: VITAMIN D3 Take 1,000 Units by mouth daily.   cyanocobalamin 1000 MCG tablet Take 1,000 mcg by mouth daily.   ferrous sulfate 325 (65 FE) MG tablet Take 1 tablet (325 mg total) by mouth daily with breakfast.   metoprolol  tartrate 25 MG tablet Commonly known as: LOPRESSOR Take 0.5 tablets (12.5 mg total) by mouth 2 (two) times daily.   Oyster Shell 500 MG Tabs Take 1 tablet by mouth daily.   pantoprazole 40 MG tablet Commonly known as: PROTONIX Take 1 tablet (40 mg total) by mouth 2 (two) times daily.   QUEtiapine 25 MG tablet Commonly known as: SEROquel Take 0.5 tablets (12.5 mg total) by mouth at bedtime.   sodium chloride 1 g tablet Take 1 g by mouth 2 (two) times daily.   SYSTANE COMPLETE OP Apply 1 drop to eye 4 (four) times daily.   traZODone 100 MG tablet Commonly known as: DESYREL Take 1 tablet (100 mg total) by mouth at bedtime.        Contact information for after-discharge care     Destination     [facility] .   Service: Skilled Nursing Contact information: [address] [place] [phone]                    Allergies  Allergen Reactions   Horse-Derived Products Other (See Comments)    Tetanus:Reaction unknown   Penicillins Other (See Comments)    Did it involve swelling of the face/tongue/throat, SOB,  or low BP? Yes-swelling Did it involve sudden or severe rash/hives, skin peeling, or any reaction on the inside of your mouth or nose? No Did you need to seek medical attention at a hospital or doctor's office? Yes-Dr. office When did it last happen?  4 years prior     If all above answers are "NO", may proceed with cephalosporin use.    Tetanus Antitoxin     Unknown reaction    Consultations: GI   Procedures/Studies: DG Swallowing Func-Speech Pathology  Result Date: 03/30/2022 Table formatting from the original result was not included. Objective Swallowing Evaluation: Type of Study: MBS-Modified Barium Swallow Study  Patient Details Name: Kelsey Thompson MRN: [id number] Date of Birth: [birth year] Today's Date: 03/30/2022 Time: SLP Start Time (ACUTE ONLY): 1405 -SLP Stop  Time (ACUTE ONLY): 1433 SLP Time Calculation (min) (ACUTE ONLY): 28 min Past Medical History: Past Medical History: Diagnosis Date  Arthritis   Bradycardia   GERD (gastroesophageal reflux disease)   Hiatal hernia   Hypercholesterolemia   Hypertension   Pacemaker   Stroke [facility])  Past Surgical History: Past Surgical History: Procedure Laterality Date  APPENDECTOMY    CESAREAN SECTION    PACEMAKER INSERTION    TONSILLECTOMY   HPI: Kelsey Thompson is a 88 y.o. female with medical history significant for paroxysmal atrial fibrillation on anticoagulation with Eliquis, pacemaker placement, prior CVA, hypertension, dyslipidemia, GERD, and dementia who was brought to the ED for evaluation of hematemesis that began earlier on 9/23 at her ALF.  She had her Eliquis reversed and underwent urgent EGD on 9/23 with findings of Zenker's diverticulum with large blood clot and signs of active bleeding.  She had Hemospray performed and has plans for repeat EGD by 9/25 and was intubated after the procedure.  She was extubated on 9/26.  She currently remains on PPI infusion. BSE requested.  Subjective: "I think I just had xrays of my teeth last week."  Recommendations for follow up therapy are one component of a multi-disciplinary discharge planning process, led by the attending physician.  Recommendations may be updated based on patient status, additional functional criteria and insurance authorization. Assessment / Plan / Recommendation   03/30/2022   3:00 PM Clinical Impressions Clinical Impression Pt presents with min oropharyngeal dysphagia and primary pharyngoesophageal dysphagia characterized by min delayed oral transit with piecemeal deglutition of purees and solids with min lingual residue, impaired anterior posterior transit of barium tablet and Pt ended up chewing up the barium tablet; swallow trigger at the level of the valleculae with cup sips of thin and at the level of the pyriforms with straw sips. She exhibited min  vallecular and lateral channel pooling after the swallow when consuming mixed consistencies (chewing the pill and swallowing thins), however this cleared with independent repeat/dry swallow. Pt had Zenker's diverticulum identified on her recent EGD and this was observed in the MBSS which resulted in retention of thins and puree. No backflow into the pharynx was observed despite cued coughing to see if this would elicit. Pt did not consume a great amount however and retrograde movement possible over the course of a meal. Recommend D2/fine chop and thin liquids and ensure that Pt is alert and upright for all eating and drinking and remain upright for at least 30 minutes after. No further SLP services indicated at this time. Above reviewed with RN. SLP Visit Diagnosis Dysphagia, pharyngoesophageal phase (R13.14) Impact on safety and function Mild aspiration risk;Risk for inadequate nutrition/hydration     03/30/2022  3:00 PM Treatment Recommendations Treatment Recommendations No treatment recommended at this time     03/30/2022   3:00 PM Prognosis Prognosis for Safe Diet Advancement Fair Barriers to Reach Goals Severity of deficits   03/30/2022   3:00 PM Diet Recommendations SLP Diet Recommendations Dysphagia 2 (Fine chop) solids;Thin liquid Liquid Administration via Cup;Straw Medication Administration Crushed with puree Compensations Slow rate;Small sips/bites Postural Changes Remain semi-upright after after feeds/meals (Comment);Seated upright at 90 degrees     03/30/2022   3:00 PM Other Recommendations Oral Care Recommendations Oral care BID Other Recommendations Clarify dietary restrictions Follow Up Recommendations No SLP follow up Assistance recommended at discharge Frequent or constant Supervision/Assistance Functional Status Assessment Patient has had a recent decline in their functional status and demonstrates the ability to make significant improvements in function in a reasonable and predictable amount of time.    03/29/2022  12:00 PM Frequency and Duration  Speech Therapy Frequency (ACUTE ONLY) min 2x/week Treatment Duration 1 week     03/30/2022   3:00 PM Oral Phase Oral Phase Impaired Oral - Puree Piecemeal swallowing;Lingual/palatal residue Oral - Regular Delayed oral transit;Piecemeal swallowing;Lingual/palatal residue Oral - Pill --    03/30/2022   3:00 PM Pharyngeal Phase Pharyngeal Phase Impaired Pharyngeal- Thin Teaspoon Delayed swallow initiation-vallecula Pharyngeal- Thin Cup Delayed swallow initiation-vallecula;Pharyngeal residue - valleculae;Penetration/Aspiration before swallow Pharyngeal Material enters airway, remains ABOVE vocal cords then ejected out Pharyngeal- Thin Straw Delayed swallow initiation-pyriform sinuses;Pharyngeal residue - valleculae;Pharyngeal residue - pyriform Pharyngeal- Puree WFL Pharyngeal- Regular WFL Pharyngeal- Pill NT    03/30/2022   3:00 PM Cervical Esophageal Phase  Cervical Esophageal Phase Impaired Thin Cup Other (Comment) Cervical Esophageal Comment Zenker's diverticulum Thank you, Havery Moros, CCC-SLP [phone] PORTER,DABNEY 03/30/2022, 4:15 PM                     DG Chest Port 1 View  Result Date: 03/28/2022 CLINICAL DATA:  Respiratory failure EXAM: PORTABLE CHEST 1 VIEW COMPARISON:  03/25/2022 FINDINGS: Interval endotracheal intubation, tube tip over the midtrachea. Cardiomegaly with left chest multi lead pacer. New small bilateral pleural effusions. Lungs are normally aerated. IMPRESSION: 1. Interval endotracheal intubation, tube tip over the midtrachea. 2. New small bilateral pleural effusions. 3. Cardiomegaly. Electronically Signed   By: Jearld Lesch M.D.   On: 03/28/2022 09:17   DG Chest Port 1 View  Result Date: 03/25/2022 CLINICAL DATA:  Fall and altered mental status. EXAM: PORTABLE CHEST 1 VIEW COMPARISON:  10/12/2020 and prior radiographs FINDINGS: Cardiomediastinal silhouette and LEFT-sided pacemaker are unchanged. There is no evidence of focal airspace  disease, pulmonary edema, suspicious pulmonary nodule/mass, pleural effusion, or pneumothorax. No acute bony abnormalities are identified. IMPRESSION: No active disease. Electronically Signed   By: Harmon Pier M.D.   On: 03/25/2022 10:46      Subjective: Confused, no family at bedside. Staff reports some agitation overnight  Discharge Exam: Vitals:   04/02/22 2001 04/03/22 0459 04/03/22 1009 04/03/22 1015  BP: (!) 122/91 (!) 154/124 (!) 152/90 (!) 152/90  Pulse: 61 68 68 68  Resp: 20 16    Temp: 98.3 F (36.8 C) 98.1 F (36.7 C)    TempSrc: Oral     SpO2: 97% 94%    Weight:      Height:        General: Pt is alert, awake, not in acute distress Cardiovascular: RRR, S1/S2 +, no rubs, no gallops Respiratory: CTA bilaterally, no wheezing, no rhonchi Abdominal: Soft, NT, ND, bowel sounds + Extremities:  no edema, no cyanosis    The results of significant diagnostics from this hospitalization (including imaging, microbiology, ancillary and laboratory) are listed below for reference.     Microbiology: No results found for this or any previous visit (from the past 240 hour(s)).   Labs: BNP (last 3 results) No results for input(s): "BNP" in the last 8760 hours. Basic Metabolic Panel: Recent Labs  Lab 03/28/22 0401 03/29/22 1155 03/30/22 0539 03/31/22 0601 04/01/22 0436  NA 137 136 136 137 137  K 3.5 3.5 3.3* 2.9* 4.0  CL 107 103 105 104 103  CO2 26 24 25 28 27   GLUCOSE 121* 279* 116* 104* 115*  BUN 13 11 13 20 22   CREATININE 0.71 0.76 0.77 0.85 0.89  CALCIUM 8.1* 8.2* 8.2* 8.3* 8.6*  MG 1.7 1.7 1.7 1.7 1.8   Liver Function Tests: No results for input(s): "AST", "ALT", "ALKPHOS", "BILITOT", "PROT", "ALBUMIN" in the last 168 hours. No results for input(s): "LIPASE", "AMYLASE" in the last 168 hours. No results for input(s): "AMMONIA" in the last 168 hours. CBC: Recent Labs  Lab 03/29/22 1155 03/30/22 0539 03/31/22 0601 04/01/22 0436 04/03/22 0715  WBC 10.6*  10.6* 7.7 8.5 7.2  HGB 9.0* 9.1* 8.0* 7.8* 9.0*  HCT 27.9* 28.8* 24.8* 23.8* 28.0*  MCV 95.9 97.6 91.9 91.5 92.1  PLT 220 238 265 304 234   Cardiac Enzymes: No results for input(s): "CKTOTAL", "CKMB", "CKMBINDEX", "TROPONINI" in the last 168 hours. BNP: Invalid input(s): "POCBNP" CBG: No results for input(s): "GLUCAP" in the last 168 hours. D-Dimer No results for input(s): "DDIMER" in the last 72 hours. Hgb A1c No results for input(s): "HGBA1C" in the last 72 hours. Lipid Profile No results for input(s): "CHOL", "HDL", "LDLCALC", "TRIG", "CHOLHDL", "LDLDIRECT" in the last 72 hours. Thyroid function studies No results for input(s): "TSH", "T4TOTAL", "T3FREE", "THYROIDAB" in the last 72 hours.  Invalid input(s): "FREET3" Anemia work up No results for input(s): "VITAMINB12", "FOLATE", "FERRITIN", "TIBC", "IRON", "RETICCTPCT" in the last 72 hours. Urinalysis    Component Value Date/Time   COLORURINE YELLOW 10/20/2020 1219   APPEARANCEUR CLEAR 10/20/2020 1219   APPEARANCEUR Clear 10/11/2020 1407   LABSPEC 1.024 10/20/2020 1219   PHURINE 5.0 10/20/2020 1219   GLUCOSEU NEGATIVE 10/20/2020 1219   HGBUR NEGATIVE 10/20/2020 1219   BILIRUBINUR NEGATIVE 10/20/2020 1219   BILIRUBINUR Negative 10/11/2020 1407   KETONESUR 20 (A) 10/20/2020 1219   PROTEINUR 100 (A) 10/20/2020 1219   UROBILINOGEN 0.2 07/02/2011 0000   NITRITE NEGATIVE 10/20/2020 1219   LEUKOCYTESUR SMALL (A) 10/20/2020 1219   Sepsis Labs Recent Labs  Lab 03/30/22 0539 03/31/22 0601 04/01/22 0436 04/03/22 0715  WBC 10.6* 7.7 8.5 7.2   Microbiology No results found for this or any previous visit (from the past 240 hour(s)).   Time coordinating discharge: 35mins  SIGNED:   Erick BlinksJehanzeb Aidden Markovic, MD  [facility] 04/03/2022, 1:41 PM   If 7PM-7AM, please contact night-coverage [email or web address]

## 2022-04-04 DIAGNOSIS — K2101 Gastro-esophageal reflux disease with esophagitis, with bleeding: Secondary | ICD-10-CM | POA: Diagnosis not present

## 2022-04-04 DIAGNOSIS — R531 Weakness: Secondary | ICD-10-CM | POA: Diagnosis not present

## 2022-04-04 DIAGNOSIS — I10 Essential (primary) hypertension: Secondary | ICD-10-CM | POA: Diagnosis not present

## 2022-04-20 ENCOUNTER — Other Ambulatory Visit: Payer: Self-pay | Admitting: *Deleted

## 2022-04-20 NOTE — Patient Outreach (Signed)
  Care Coordination   04/20/2022  Name: Kelsey Thompson MRN: [id number] DOB: 1920/10/09   Care Coordination Outreach Attempts:  An unsuccessful telephone outreach was attempted today to offer the patient information about available care coordination services as a benefit of their health plan. HIPAA compliant messages left on voicemail, providing contact information for CSW, encouraging patient to return CSW's call at her earliest convenience.    Follow Up Plan:  Additional outreach attempts will be made to offer the patient care coordination information and services.   Encounter Outcome:  No Answer.   Care Coordination Interventions Activated:  No.    Care Coordination Interventions:  No, not indicated.    Nat Christen, BSW, MSW, LCSW  Licensed Education officer, [facility]  Mailing [place] [address] [place], [place] [address] Physical Address-[address] [place], [place] [address] Toll Free Main # [phone] Fax # [phone] Cell # [phone] [email or web address]

## 2022-04-24 ENCOUNTER — Ambulatory Visit (INDEPENDENT_AMBULATORY_CARE_PROVIDER_SITE_OTHER): Payer: Medicare Other

## 2022-04-24 DIAGNOSIS — I495 Sick sinus syndrome: Secondary | ICD-10-CM | POA: Diagnosis not present

## 2022-04-25 LAB — CUP PACEART REMOTE DEVICE CHECK
Battery Impedance: 2562 Ohm
Battery Remaining Longevity: 28 mo
Battery Voltage: 2.73 V
Brady Statistic AP VP Percent: 1 %
Brady Statistic AP VS Percent: 76 %
Brady Statistic AS VP Percent: 0 %
Brady Statistic AS VS Percent: 23 %
Date Time Interrogation Session: 20231020134108
Implantable Lead Connection Status: 753985
Implantable Lead Connection Status: 753985
Implantable Lead Implant Date: 20100215
Implantable Lead Implant Date: 20100215
Implantable Lead Location: 753859
Implantable Lead Location: 753860
Implantable Lead Model: 5076
Implantable Lead Model: 5076
Implantable Pulse Generator Implant Date: 20100215
Lead Channel Impedance Value: 418 Ohm
Lead Channel Impedance Value: 630 Ohm
Lead Channel Pacing Threshold Amplitude: 0.5 V
Lead Channel Pacing Threshold Amplitude: 1.125 V
Lead Channel Pacing Threshold Pulse Width: 0.4 ms
Lead Channel Pacing Threshold Pulse Width: 0.4 ms
Lead Channel Setting Pacing Amplitude: 2 V
Lead Channel Setting Pacing Amplitude: 2.5 V
Lead Channel Setting Pacing Pulse Width: 0.4 ms
Lead Channel Setting Sensing Sensitivity: 2 mV
Zone Setting Status: 755011
Zone Setting Status: 755011

## 2022-04-26 ENCOUNTER — Encounter: Payer: Self-pay | Admitting: *Deleted

## 2022-04-26 ENCOUNTER — Ambulatory Visit: Payer: Self-pay | Admitting: *Deleted

## 2022-04-26 NOTE — Patient Outreach (Signed)
  Care Coordination   Initial Visit Note   04/26/2022  Name: Kelsey Thompson MRN: [id number] DOB: 1920-12-31  Kelsey Thompson is a 88 y.o. year old female who sees Kelsey Fraise, MD for primary care. I spoke with Margret Chance by phone today.  What matters to the patients health and wellness today?  No Interventions Identified.   SDOH assessments and interventions completed:  Yes.  SDOH Interventions Today    Flowsheet Row Most Recent Value  SDOH Interventions   Food Insecurity Interventions Intervention Not Indicated  Housing Interventions Intervention Not Indicated  Transportation Interventions Intervention Not Indicated  Utilities Interventions Intervention Not Indicated  Alcohol Usage Interventions Intervention Not Indicated (Score <7)  Financial Strain Interventions Intervention Not Indicated  Physical Activity Interventions Patient Refused  Stress Interventions Intervention Not Indicated  Social Connections Interventions Intervention Not Indicated     Care Coordination Interventions Activated:  Yes.   Care Coordination Interventions:  Yes, provided.   Follow up plan: No further intervention required.   Encounter Outcome:  Pt. Visit Completed.   Nat Christen, BSW, MSW, LCSW  Licensed Education officer, environmental [facility]  Mailing [place] [address] [place], [place] [address] Physical Address-[address] [place], [place] [address] Toll Free Main # [phone] Fax # [phone] Cell # [phone] [email or web address]

## 2022-04-26 NOTE — Patient Instructions (Signed)
Visit Information  Thank you for taking time to visit with me today. Please don't hesitate to contact me if I can be of assistance to you.   Please call the care guide team at [phone] if you need to cancel or reschedule your appointment.   If you are experiencing a Mental Health or Behavioral Health Crisis or need someone to talk to, please call the Suicide and Crisis Lifeline: 988 call the [place] National Suicide Prevention Lifeline: 1-800-273-8255 or TTY: 1-800-799-4 TTY (1-800-799-4889) to talk to a trained counselor call 1-800-273-TALK (toll free, 24 hour hotline) go to [facility] [address], [place] ([phone]) call the [place] Crisis Line: 800-939-9988 call 911  Patient verbalizes understanding of instructions and care plan provided today and agrees to view in MyChart. Active MyChart status and patient understanding of how to access instructions and care plan via MyChart confirmed with patient.     No further follow up required.  Teanna Elem, BSW, MSW, LCSW  Licensed Clinical Social Worker  [facility] [place] [facility]  Mailing Address-[address], [place], [place] [address] Physical Address-[address], [place], [place] [address] Toll Free Main # [phone] Fax # [phone] Cell # [phone] [email or web address]            

## 2022-04-28 DIAGNOSIS — E119 Type 2 diabetes mellitus without complications: Secondary | ICD-10-CM | POA: Diagnosis not present

## 2022-04-28 DIAGNOSIS — I480 Paroxysmal atrial fibrillation: Secondary | ICD-10-CM | POA: Diagnosis not present

## 2022-04-28 DIAGNOSIS — E782 Mixed hyperlipidemia: Secondary | ICD-10-CM | POA: Diagnosis not present

## 2022-04-28 DIAGNOSIS — E559 Vitamin D deficiency, unspecified: Secondary | ICD-10-CM | POA: Diagnosis not present

## 2022-04-28 DIAGNOSIS — E038 Other specified hypothyroidism: Secondary | ICD-10-CM | POA: Diagnosis not present

## 2022-04-28 DIAGNOSIS — D518 Other vitamin B12 deficiency anemias: Secondary | ICD-10-CM | POA: Diagnosis not present

## 2022-04-28 DIAGNOSIS — I10 Essential (primary) hypertension: Secondary | ICD-10-CM | POA: Diagnosis not present

## 2022-04-30 DIAGNOSIS — I10 Essential (primary) hypertension: Secondary | ICD-10-CM | POA: Diagnosis not present

## 2022-05-10 DIAGNOSIS — E038 Other specified hypothyroidism: Secondary | ICD-10-CM | POA: Diagnosis not present

## 2022-05-10 DIAGNOSIS — I10 Essential (primary) hypertension: Secondary | ICD-10-CM | POA: Diagnosis not present

## 2022-05-10 DIAGNOSIS — E785 Hyperlipidemia, unspecified: Secondary | ICD-10-CM | POA: Diagnosis not present

## 2022-05-10 DIAGNOSIS — E559 Vitamin D deficiency, unspecified: Secondary | ICD-10-CM | POA: Diagnosis not present

## 2022-05-10 DIAGNOSIS — I480 Paroxysmal atrial fibrillation: Secondary | ICD-10-CM | POA: Diagnosis not present

## 2022-05-10 DIAGNOSIS — D518 Other vitamin B12 deficiency anemias: Secondary | ICD-10-CM | POA: Diagnosis not present

## 2022-05-10 DIAGNOSIS — E119 Type 2 diabetes mellitus without complications: Secondary | ICD-10-CM | POA: Diagnosis not present

## 2022-05-15 NOTE — Progress Notes (Signed)
Remote pacemaker transmission.   

## 2022-05-31 DIAGNOSIS — I10 Essential (primary) hypertension: Secondary | ICD-10-CM | POA: Diagnosis not present

## 2022-06-04 ENCOUNTER — Emergency Department (HOSPITAL_COMMUNITY): Payer: Medicare Other

## 2022-06-04 ENCOUNTER — Encounter (HOSPITAL_COMMUNITY): Payer: Self-pay

## 2022-06-04 ENCOUNTER — Other Ambulatory Visit: Payer: Self-pay

## 2022-06-04 ENCOUNTER — Emergency Department (HOSPITAL_COMMUNITY)
Admission: EM | Admit: 2022-06-04 | Discharge: 2022-06-04 | Disposition: A | Discharge status: Skilled Nursing Facility | Payer: Medicare Other | Attending: Emergency Medicine | Admitting: Emergency Medicine

## 2022-06-04 DIAGNOSIS — W19XXXA Unspecified fall, initial encounter: Secondary | ICD-10-CM | POA: Diagnosis not present

## 2022-06-04 DIAGNOSIS — R000 Tachycardia, unspecified: Secondary | ICD-10-CM | POA: Diagnosis not present

## 2022-06-04 DIAGNOSIS — S0990XA Unspecified injury of head, initial encounter: Secondary | ICD-10-CM | POA: Diagnosis not present

## 2022-06-04 DIAGNOSIS — S0003XA Contusion of scalp, initial encounter: Secondary | ICD-10-CM | POA: Diagnosis not present

## 2022-06-04 DIAGNOSIS — S0083XA Contusion of other part of head, initial encounter: Secondary | ICD-10-CM | POA: Diagnosis not present

## 2022-06-04 DIAGNOSIS — I10 Essential (primary) hypertension: Secondary | ICD-10-CM | POA: Diagnosis not present

## 2022-06-04 DIAGNOSIS — T148XXA Other injury of unspecified body region, initial encounter: Secondary | ICD-10-CM

## 2022-06-04 DIAGNOSIS — Z79899 Other long term (current) drug therapy: Secondary | ICD-10-CM | POA: Diagnosis not present

## 2022-06-04 DIAGNOSIS — Z043 Encounter for examination and observation following other accident: Secondary | ICD-10-CM | POA: Diagnosis not present

## 2022-06-04 DIAGNOSIS — Y92129 Unspecified place in nursing home as the place of occurrence of the external cause: Secondary | ICD-10-CM | POA: Insufficient documentation

## 2022-06-04 LAB — URINALYSIS, ROUTINE W REFLEX MICROSCOPIC
Bilirubin Urine: NEGATIVE
Glucose, UA: NEGATIVE mg/dL
Hgb urine dipstick: NEGATIVE
Ketones, ur: NEGATIVE mg/dL
Leukocytes,Ua: NEGATIVE
Nitrite: NEGATIVE
Protein, ur: NEGATIVE mg/dL
Specific Gravity, Urine: 1.006 (ref 1.005–1.030)
pH: 8 (ref 5.0–8.0)

## 2022-06-04 LAB — CBC WITH DIFFERENTIAL/PLATELET
Abs Immature Granulocytes: 0.02 10*3/uL (ref 0.00–0.07)
Basophils Absolute: 0 10*3/uL (ref 0.0–0.1)
Basophils Relative: 1 %
Eosinophils Absolute: 0.1 10*3/uL (ref 0.0–0.5)
Eosinophils Relative: 1 %
HCT: 34.8 % — ABNORMAL LOW (ref 36.0–46.0)
Hemoglobin: 11.5 g/dL — ABNORMAL LOW (ref 12.0–15.0)
Immature Granulocytes: 0 %
Lymphocytes Relative: 17 %
Lymphs Abs: 1.1 10*3/uL (ref 0.7–4.0)
MCH: 28.2 pg (ref 26.0–34.0)
MCHC: 33 g/dL (ref 30.0–36.0)
MCV: 85.3 fL (ref 80.0–100.0)
Monocytes Absolute: 0.5 10*3/uL (ref 0.1–1.0)
Monocytes Relative: 7 %
Neutro Abs: 4.8 10*3/uL (ref 1.7–7.7)
Neutrophils Relative %: 74 %
Platelets: 220 10*3/uL (ref 150–400)
RBC: 4.08 MIL/uL (ref 3.87–5.11)
RDW: 15.7 % — ABNORMAL HIGH (ref 11.5–15.5)
WBC: 6.4 10*3/uL (ref 4.0–10.5)
nRBC: 0 % (ref 0.0–0.2)

## 2022-06-04 LAB — BASIC METABOLIC PANEL
Anion gap: 8 (ref 5–15)
BUN: 18 mg/dL (ref 8–23)
CO2: 28 mmol/L (ref 22–32)
Calcium: 9.1 mg/dL (ref 8.9–10.3)
Chloride: 100 mmol/L (ref 98–111)
Creatinine, Ser: 0.79 mg/dL (ref 0.44–1.00)
GFR, Estimated: >60 mL/min (ref >60)
Glucose, Bld: 108 mg/dL — ABNORMAL HIGH (ref 70–99)
Potassium: 3.9 mmol/L (ref 3.5–5.1)
Sodium: 136 mmol/L (ref 135–145)

## 2022-06-04 MED ORDER — SODIUM CHLORIDE 0.9 % IV BOLUS
500.0000 mL | Freq: Once | INTRAVENOUS | Status: AC
  Administered 2022-06-04: 500 mL via INTRAVENOUS

## 2022-06-04 NOTE — ED Notes (Signed)
Pt's daughter given discharge instructions but walked out to get the car before signing.

## 2022-06-04 NOTE — ED Provider Notes (Signed)
[facility] EMERGENCY DEPARTMENT Provider Note   CSN: [id number] Arrival date & time: 06/04/22  1443     History  Chief Complaint  Patient presents with   Kelsey Thompson is a 88 y.o. female.  HPI     88 year old patient comes in with chief complaint of fall from nursing home. Patient has history of hypertension, hyperlipidemia, stroke.  According to the nursing home report, patient was found sitting on her bed with a bruise to her forehead.  She was last seen sometime between midnight and 6, at which time she is suspected to not have a bruise.  No fall witnessed.  Patient has no complaints from her side.  Specifically she denies any headache, neck pain, chest pain, back pain or leg pain.  Reviewed patient's medication, she is not on any blood thinners.  Home Medications Prior to Admission medications   Medication Sig Start Date End Date Taking? Authorizing Provider  acetaminophen (TYLENOL) 500 MG tablet Take 500 mg by mouth every 6 (six) hours as needed for mild pain or moderate pain.    [provider]  amLODipine (NORVASC) 5 MG tablet Take 1 tablet (5 mg total) by mouth daily. 04/07/19   Mechele Claude, MD  calcium-vitamin D (OSCAL WITH D) 500-200 MG-UNIT per tablet Take 1 tablet by mouth daily.    [provider]  cholecalciferol (VITAMIN D3) 25 MCG (1000 UNIT) tablet Take 1,000 Units by mouth daily.    [provider]  cyanocobalamin 1000 MCG tablet Take 1,000 mcg by mouth daily.    [provider]  ferrous sulfate 325 (65 FE) MG tablet Take 1 tablet (325 mg total) by mouth daily with breakfast. 04/07/19   Mechele Claude, MD  metoprolol tartrate (LOPRESSOR) 25 MG tablet Take 0.5 tablets (12.5 mg total) by mouth 2 (two) times daily. 04/03/22   Erick Blinks, MD  Oyster Shell 500 MG TABS Take 1 tablet by mouth daily.    [provider]  pantoprazole (PROTONIX) 40 MG tablet Take 1 tablet (40 mg total) by mouth 2 (two) times  daily. 04/03/22   Erick Blinks, MD  Propylene Glycol (SYSTANE COMPLETE OP) Apply 1 drop to eye 4 (four) times daily.    [provider]  QUEtiapine (SEROQUEL) 25 MG tablet Take 0.5 tablets (12.5 mg total) by mouth at bedtime. 04/03/22   Erick Blinks, MD  sodium chloride 1 g tablet Take 1 g by mouth 2 (two) times daily.    [provider]  traZODone (DESYREL) 100 MG tablet Take 1 tablet (100 mg total) by mouth at bedtime. 04/03/22   Erick Blinks, MD  vitamin C (ASCORBIC ACID) 500 MG tablet Take 500 mg by mouth daily.    [provider]      Allergies    Cefdinir, Horse-derived products, Penicillins, and Tetanus antitoxin    Review of Systems   Review of Systems  Physical Exam Updated Vital Signs BP 136/62   Pulse (!) 47   Temp 97.6 F (36.4 C) (Oral)   Resp 16   Ht 5\' 6"  (1.676 m)   Wt 44.2 kg   SpO2 91%   BMI 15.72 kg/m  Physical Exam Constitutional:      Appearance: She is well-developed.  HENT:     Head: Normocephalic and atraumatic.  Eyes:     Extraocular Movements: Extraocular movements intact.  Cardiovascular:     Rate and Rhythm: Normal rate.     Heart sounds: Normal heart  sounds.  Pulmonary:     Effort: Pulmonary effort is normal. No respiratory distress.  Abdominal:     Palpations: Abdomen is soft.  Musculoskeletal:     Cervical back: Neck supple.     Comments: Cachectic woman. Head to toe evaluation shows large hematoma to the left forehead.  There is no bleeding of the scalp, no facial abrasions, no spine step offs, crepitus of the chest or neck, no tenderness to palpation of the bilateral upper and lower extremities, no gross deformities, no chest tenderness, no pelvic pain.   Skin:    General: Skin is warm and dry.  Neurological:     Mental Status: She is alert and oriented to person, place, and time.     ED Results / Procedures / Treatments   Labs (all labs ordered are listed, but only abnormal results are  displayed) Labs Reviewed  CBC WITH DIFFERENTIAL/PLATELET - Abnormal; Notable for the following components:      Result Value   Hemoglobin 11.5 (*)    HCT 34.8 (*)    RDW 15.7 (*)    All other components within normal limits  URINALYSIS, ROUTINE W REFLEX MICROSCOPIC - Abnormal; Notable for the following components:   Color, Urine STRAW (*)    All other components within normal limits  BASIC METABOLIC PANEL - Abnormal; Notable for the following components:   Glucose, Bld 108 (*)    All other components within normal limits    EKG None  Radiology CT Head Wo Contrast  Result Date: 06/04/2022 CLINICAL DATA:  Unwitnessed fall with forehead hematoma EXAM: CT HEAD WITHOUT CONTRAST CT CERVICAL SPINE WITHOUT CONTRAST TECHNIQUE: Multidetector CT imaging of the head and cervical spine was performed following the standard protocol without intravenous contrast. Multiplanar CT image reconstructions of the cervical spine were also generated. RADIATION DOSE REDUCTION: This exam was performed according to the departmental dose-optimization program which includes automated exposure control, adjustment of the mA and/or kV according to patient size and/or use of iterative reconstruction technique. COMPARISON:  10/17/2020 head CT FINDINGS: CT HEAD FINDINGS Brain: No evidence of acute infarction, hemorrhage, hydrocephalus, extra-axial collection or mass lesion/mass effect. Extensive chronic small vessel ischemia in the cerebral white matter. Generalized atrophy Vascular: No hyperdense vessel or unexpected calcification. Skull: Left anterior scalp hematoma.  No acute fracture Sinuses/Orbits: No visible injury CT CERVICAL SPINE FINDINGS Alignment: No traumatic malalignment. Skull base and vertebrae: No acute fracture. Soft tissues and spinal canal: No prevertebral fluid or swelling. No visible canal hematoma. Gas/debris containing collection extending posterior and inferior from the esophageal verge consistent with  Zenker's diverticulum, up to 2.6 cm on axial images. Disc levels: Generalized disc and facet degeneration. Ankylosis at the C3-4 facets. Notable herniation at C4-5 indenting the ventral cord on sagittal reformats. Upper chest: No visible injury IMPRESSION: 1. No evidence of acute intracranial or cervical spine fracture. 2. Scalp hematoma without calvarial fracture. 3. Notable C4-5 herniation impacting the ventral cord. 4. Atrophy and chronic small vessel ischemia. 5. Zenker's diverticulum. Electronically Signed   By: Tiburcio Pea M.D.   On: 06/04/2022 08:42   CT Cervical Spine Wo Contrast  Result Date: 06/04/2022 CLINICAL DATA:  Unwitnessed fall with forehead hematoma EXAM: CT HEAD WITHOUT CONTRAST CT CERVICAL SPINE WITHOUT CONTRAST TECHNIQUE: Multidetector CT imaging of the head and cervical spine was performed following the standard protocol without intravenous contrast. Multiplanar CT image reconstructions of the cervical spine were also generated. RADIATION DOSE REDUCTION: This exam was performed according to the departmental  dose-optimization program which includes automated exposure control, adjustment of the mA and/or kV according to patient size and/or use of iterative reconstruction technique. COMPARISON:  10/17/2020 head CT FINDINGS: CT HEAD FINDINGS Brain: No evidence of acute infarction, hemorrhage, hydrocephalus, extra-axial collection or mass lesion/mass effect. Extensive chronic small vessel ischemia in the cerebral white matter. Generalized atrophy Vascular: No hyperdense vessel or unexpected calcification. Skull: Left anterior scalp hematoma.  No acute fracture Sinuses/Orbits: No visible injury CT CERVICAL SPINE FINDINGS Alignment: No traumatic malalignment. Skull base and vertebrae: No acute fracture. Soft tissues and spinal canal: No prevertebral fluid or swelling. No visible canal hematoma. Gas/debris containing collection extending posterior and inferior from the esophageal verge  consistent with Zenker's diverticulum, up to 2.6 cm on axial images. Disc levels: Generalized disc and facet degeneration. Ankylosis at the C3-4 facets. Notable herniation at C4-5 indenting the ventral cord on sagittal reformats. Upper chest: No visible injury IMPRESSION: 1. No evidence of acute intracranial or cervical spine fracture. 2. Scalp hematoma without calvarial fracture. 3. Notable C4-5 herniation impacting the ventral cord. 4. Atrophy and chronic small vessel ischemia. 5. Zenker's diverticulum. Electronically Signed   By: Tiburcio Pea M.D.   On: 06/04/2022 08:42    Procedures Procedures    Medications Ordered in ED Medications  sodium chloride 0.9 % bolus 500 mL (0 mLs Intravenous Stopped 06/04/22 1142)    ED Course/ Medical Decision Making/ A&P Clinical Course as of 06/04/22 1335  Sun Jun 04, 2022  1333 CT Head Wo Contrast Ct head interpreted - it is negative for bleed. [AN]  1334 Family wanted labs. Pt had anemia in the recent past and is weaker than usual. No resp complaints.  History provided by daughter and snf. Snf indicated no new complaints from their side.  [AN]    Clinical Course User Index [AN] Derwood Kaplan, MD                           Medical Decision Making 88 year old patient comes in with chief complaint of fall.  Patient has history of hypertension, bradycardia, stroke and hyperlipidemia.  She resides at nursing home.  Patient found sitting on her bed with a large bruise to the left side of the forehead.  No witnessed fall.  Unsure what the cause for the bruise is.  Head to toe evaluation does not show any other areas of bruise.  CT scan of the head, C-spine ordered.  Amount and/or Complexity of Data Reviewed Labs: ordered. Radiology: ordered.    Final Clinical Impression(s) / ED Diagnoses Final diagnoses:  Hematoma and contusion    Rx / DC Orders ED Discharge Orders     None         Derwood Kaplan, MD 06/04/22 1335

## 2022-06-04 NOTE — ED Triage Notes (Signed)
REMS brought pt from [facility] with c/o a unwitnessed fall sometime between midnight and 6am. Pt was in the bed but was found in her w/c with a purple bruise to her left forehead. Pt is Hard of hearing. Pt verbalized she is fell out of the bed.

## 2022-06-04 NOTE — Discharge Instructions (Addendum)
We saw you in the ER after you had a fall. °All the imaging results are normal, no fractures seen. No evidence of brain bleed. °Please be very careful with walking, and do everything possible to prevent falls. ° ° °

## 2022-06-04 NOTE — ED Notes (Signed)
Daughter at bedside reports pt does not ambulate normally, but is able to stand and transfer to wheelchair with help. Pt was able to assist herself to the side of the bed independently and then while holding onto nursing staff was able to stand and pivot over to chair next to bed. Pt was then able to stand while holding onto RN and pivot back to bed. Pt was able to put her legs back into the bed independently. Pt did need assistance with sliding up in bed. Daughter reported what she did was normal for her while she has been at [facility].

## 2022-06-06 DIAGNOSIS — I10 Essential (primary) hypertension: Secondary | ICD-10-CM | POA: Diagnosis not present

## 2022-06-06 DIAGNOSIS — R296 Repeated falls: Secondary | ICD-10-CM | POA: Diagnosis not present

## 2022-06-06 DIAGNOSIS — D649 Anemia, unspecified: Secondary | ICD-10-CM | POA: Diagnosis not present

## 2022-06-06 DIAGNOSIS — J302 Other seasonal allergic rhinitis: Secondary | ICD-10-CM | POA: Diagnosis not present

## 2022-06-06 DIAGNOSIS — E785 Hyperlipidemia, unspecified: Secondary | ICD-10-CM | POA: Diagnosis not present

## 2022-06-06 DIAGNOSIS — I480 Paroxysmal atrial fibrillation: Secondary | ICD-10-CM | POA: Diagnosis not present

## 2022-06-06 DIAGNOSIS — S0083XA Contusion of other part of head, initial encounter: Secondary | ICD-10-CM | POA: Diagnosis not present

## 2022-06-06 DIAGNOSIS — K219 Gastro-esophageal reflux disease without esophagitis: Secondary | ICD-10-CM | POA: Diagnosis not present

## 2022-06-23 DIAGNOSIS — R410 Disorientation, unspecified: Secondary | ICD-10-CM | POA: Diagnosis not present

## 2022-06-27 DIAGNOSIS — E782 Mixed hyperlipidemia: Secondary | ICD-10-CM | POA: Diagnosis not present

## 2022-06-27 DIAGNOSIS — I480 Paroxysmal atrial fibrillation: Secondary | ICD-10-CM | POA: Diagnosis not present

## 2022-06-27 DIAGNOSIS — E038 Other specified hypothyroidism: Secondary | ICD-10-CM | POA: Diagnosis not present

## 2022-06-27 DIAGNOSIS — I10 Essential (primary) hypertension: Secondary | ICD-10-CM | POA: Diagnosis not present

## 2022-06-27 DIAGNOSIS — E119 Type 2 diabetes mellitus without complications: Secondary | ICD-10-CM | POA: Diagnosis not present

## 2022-06-27 DIAGNOSIS — E559 Vitamin D deficiency, unspecified: Secondary | ICD-10-CM | POA: Diagnosis not present

## 2022-06-27 DIAGNOSIS — D518 Other vitamin B12 deficiency anemias: Secondary | ICD-10-CM | POA: Diagnosis not present

## 2022-06-30 DIAGNOSIS — I10 Essential (primary) hypertension: Secondary | ICD-10-CM | POA: Diagnosis not present

## 2022-07-24 ENCOUNTER — Ambulatory Visit: Payer: Medicare Other | Attending: Internal Medicine

## 2022-07-24 DIAGNOSIS — E785 Hyperlipidemia, unspecified: Secondary | ICD-10-CM | POA: Diagnosis not present

## 2022-07-24 DIAGNOSIS — I10 Essential (primary) hypertension: Secondary | ICD-10-CM | POA: Diagnosis not present

## 2022-07-24 DIAGNOSIS — E038 Other specified hypothyroidism: Secondary | ICD-10-CM | POA: Diagnosis not present

## 2022-07-24 DIAGNOSIS — I495 Sick sinus syndrome: Secondary | ICD-10-CM | POA: Diagnosis not present

## 2022-07-24 DIAGNOSIS — I480 Paroxysmal atrial fibrillation: Secondary | ICD-10-CM | POA: Diagnosis not present

## 2022-07-24 DIAGNOSIS — D518 Other vitamin B12 deficiency anemias: Secondary | ICD-10-CM | POA: Diagnosis not present

## 2022-07-24 DIAGNOSIS — E119 Type 2 diabetes mellitus without complications: Secondary | ICD-10-CM | POA: Diagnosis not present

## 2022-07-24 DIAGNOSIS — E559 Vitamin D deficiency, unspecified: Secondary | ICD-10-CM | POA: Diagnosis not present

## 2022-07-25 DIAGNOSIS — E785 Hyperlipidemia, unspecified: Secondary | ICD-10-CM | POA: Diagnosis not present

## 2022-07-25 DIAGNOSIS — K219 Gastro-esophageal reflux disease without esophagitis: Secondary | ICD-10-CM | POA: Diagnosis not present

## 2022-07-25 DIAGNOSIS — J302 Other seasonal allergic rhinitis: Secondary | ICD-10-CM | POA: Diagnosis not present

## 2022-07-25 DIAGNOSIS — I480 Paroxysmal atrial fibrillation: Secondary | ICD-10-CM | POA: Diagnosis not present

## 2022-07-25 DIAGNOSIS — I10 Essential (primary) hypertension: Secondary | ICD-10-CM | POA: Diagnosis not present

## 2022-07-25 LAB — CUP PACEART REMOTE DEVICE CHECK
Battery Impedance: 2867 Ohm
Battery Remaining Longevity: 25 mo
Battery Voltage: 2.71 V
Brady Statistic AP VP Percent: 1 %
Brady Statistic AP VS Percent: 77 %
Brady Statistic AS VP Percent: 0 %
Brady Statistic AS VS Percent: 22 %
Date Time Interrogation Session: 20240122095451
Implantable Lead Connection Status: 753985
Implantable Lead Connection Status: 753985
Implantable Lead Implant Date: 20100215
Implantable Lead Implant Date: 20100215
Implantable Lead Location: 753859
Implantable Lead Location: 753860
Implantable Lead Model: 5076
Implantable Lead Model: 5076
Implantable Pulse Generator Implant Date: 20100215
Lead Channel Impedance Value: 453 Ohm
Lead Channel Impedance Value: 655 Ohm
Lead Channel Pacing Threshold Amplitude: 0.5 V
Lead Channel Pacing Threshold Amplitude: 1.25 V
Lead Channel Pacing Threshold Pulse Width: 0.4 ms
Lead Channel Pacing Threshold Pulse Width: 0.4 ms
Lead Channel Setting Pacing Amplitude: 2 V
Lead Channel Setting Pacing Amplitude: 2.5 V
Lead Channel Setting Pacing Pulse Width: 0.4 ms
Lead Channel Setting Sensing Sensitivity: 2.8 mV
Zone Setting Status: 755011
Zone Setting Status: 755011

## 2022-07-31 DIAGNOSIS — I10 Essential (primary) hypertension: Secondary | ICD-10-CM | POA: Diagnosis not present

## 2022-08-15 DIAGNOSIS — D518 Other vitamin B12 deficiency anemias: Secondary | ICD-10-CM | POA: Diagnosis not present

## 2022-08-15 DIAGNOSIS — I480 Paroxysmal atrial fibrillation: Secondary | ICD-10-CM | POA: Diagnosis not present

## 2022-08-15 DIAGNOSIS — I10 Essential (primary) hypertension: Secondary | ICD-10-CM | POA: Diagnosis not present

## 2022-08-15 DIAGNOSIS — E038 Other specified hypothyroidism: Secondary | ICD-10-CM | POA: Diagnosis not present

## 2022-08-15 DIAGNOSIS — E559 Vitamin D deficiency, unspecified: Secondary | ICD-10-CM | POA: Diagnosis not present

## 2022-08-15 DIAGNOSIS — E785 Hyperlipidemia, unspecified: Secondary | ICD-10-CM | POA: Diagnosis not present

## 2022-08-15 DIAGNOSIS — E119 Type 2 diabetes mellitus without complications: Secondary | ICD-10-CM | POA: Diagnosis not present

## 2022-08-22 DIAGNOSIS — I480 Paroxysmal atrial fibrillation: Secondary | ICD-10-CM | POA: Diagnosis not present

## 2022-08-22 DIAGNOSIS — K219 Gastro-esophageal reflux disease without esophagitis: Secondary | ICD-10-CM | POA: Diagnosis not present

## 2022-08-22 DIAGNOSIS — J302 Other seasonal allergic rhinitis: Secondary | ICD-10-CM | POA: Diagnosis not present

## 2022-08-22 DIAGNOSIS — I69359 Hemiplegia and hemiparesis following cerebral infarction affecting unspecified side: Secondary | ICD-10-CM | POA: Diagnosis not present

## 2022-08-22 DIAGNOSIS — I10 Essential (primary) hypertension: Secondary | ICD-10-CM | POA: Diagnosis not present

## 2022-08-22 DIAGNOSIS — E785 Hyperlipidemia, unspecified: Secondary | ICD-10-CM | POA: Diagnosis not present

## 2022-08-30 DIAGNOSIS — M79671 Pain in right foot: Secondary | ICD-10-CM | POA: Diagnosis not present

## 2022-08-30 DIAGNOSIS — M79672 Pain in left foot: Secondary | ICD-10-CM | POA: Diagnosis not present

## 2022-08-30 DIAGNOSIS — L600 Ingrowing nail: Secondary | ICD-10-CM | POA: Diagnosis not present

## 2022-08-30 DIAGNOSIS — B351 Tinea unguium: Secondary | ICD-10-CM | POA: Diagnosis not present

## 2022-08-31 DIAGNOSIS — I10 Essential (primary) hypertension: Secondary | ICD-10-CM | POA: Diagnosis not present

## 2022-09-07 DIAGNOSIS — E782 Mixed hyperlipidemia: Secondary | ICD-10-CM | POA: Diagnosis not present

## 2022-09-07 DIAGNOSIS — Z79899 Other long term (current) drug therapy: Secondary | ICD-10-CM | POA: Diagnosis not present

## 2022-09-07 DIAGNOSIS — E038 Other specified hypothyroidism: Secondary | ICD-10-CM | POA: Diagnosis not present

## 2022-09-07 DIAGNOSIS — E119 Type 2 diabetes mellitus without complications: Secondary | ICD-10-CM | POA: Diagnosis not present

## 2022-09-07 DIAGNOSIS — D518 Other vitamin B12 deficiency anemias: Secondary | ICD-10-CM | POA: Diagnosis not present

## 2022-09-07 NOTE — Progress Notes (Signed)
Remote pacemaker transmission.   

## 2022-09-18 DIAGNOSIS — E038 Other specified hypothyroidism: Secondary | ICD-10-CM | POA: Diagnosis not present

## 2022-09-18 DIAGNOSIS — I69359 Hemiplegia and hemiparesis following cerebral infarction affecting unspecified side: Secondary | ICD-10-CM | POA: Diagnosis not present

## 2022-09-18 DIAGNOSIS — I70223 Atherosclerosis of native arteries of extremities with rest pain, bilateral legs: Secondary | ICD-10-CM | POA: Diagnosis not present

## 2022-09-18 DIAGNOSIS — E559 Vitamin D deficiency, unspecified: Secondary | ICD-10-CM | POA: Diagnosis not present

## 2022-09-18 DIAGNOSIS — E7849 Other hyperlipidemia: Secondary | ICD-10-CM | POA: Diagnosis not present

## 2022-09-18 DIAGNOSIS — I10 Essential (primary) hypertension: Secondary | ICD-10-CM | POA: Diagnosis not present

## 2022-09-18 DIAGNOSIS — I480 Paroxysmal atrial fibrillation: Secondary | ICD-10-CM | POA: Diagnosis not present

## 2022-09-18 DIAGNOSIS — E119 Type 2 diabetes mellitus without complications: Secondary | ICD-10-CM | POA: Diagnosis not present

## 2022-09-18 DIAGNOSIS — D518 Other vitamin B12 deficiency anemias: Secondary | ICD-10-CM | POA: Diagnosis not present

## 2022-10-03 DIAGNOSIS — G4700 Insomnia, unspecified: Secondary | ICD-10-CM | POA: Diagnosis not present

## 2022-10-03 DIAGNOSIS — J302 Other seasonal allergic rhinitis: Secondary | ICD-10-CM | POA: Diagnosis not present

## 2022-10-03 DIAGNOSIS — E785 Hyperlipidemia, unspecified: Secondary | ICD-10-CM | POA: Diagnosis not present

## 2022-10-03 DIAGNOSIS — I10 Essential (primary) hypertension: Secondary | ICD-10-CM | POA: Diagnosis not present

## 2022-10-03 DIAGNOSIS — I480 Paroxysmal atrial fibrillation: Secondary | ICD-10-CM | POA: Diagnosis not present

## 2022-10-03 DIAGNOSIS — K449 Diaphragmatic hernia without obstruction or gangrene: Secondary | ICD-10-CM | POA: Diagnosis not present

## 2022-10-03 DIAGNOSIS — D649 Anemia, unspecified: Secondary | ICD-10-CM | POA: Diagnosis not present

## 2022-10-04 DIAGNOSIS — N390 Urinary tract infection, site not specified: Secondary | ICD-10-CM | POA: Diagnosis not present

## 2022-10-16 DIAGNOSIS — I69359 Hemiplegia and hemiparesis following cerebral infarction affecting unspecified side: Secondary | ICD-10-CM | POA: Diagnosis not present

## 2022-10-16 DIAGNOSIS — E7849 Other hyperlipidemia: Secondary | ICD-10-CM | POA: Diagnosis not present

## 2022-10-16 DIAGNOSIS — E119 Type 2 diabetes mellitus without complications: Secondary | ICD-10-CM | POA: Diagnosis not present

## 2022-10-16 DIAGNOSIS — E038 Other specified hypothyroidism: Secondary | ICD-10-CM | POA: Diagnosis not present

## 2022-10-16 DIAGNOSIS — I70223 Atherosclerosis of native arteries of extremities with rest pain, bilateral legs: Secondary | ICD-10-CM | POA: Diagnosis not present

## 2022-10-16 DIAGNOSIS — I480 Paroxysmal atrial fibrillation: Secondary | ICD-10-CM | POA: Diagnosis not present

## 2022-10-16 DIAGNOSIS — E559 Vitamin D deficiency, unspecified: Secondary | ICD-10-CM | POA: Diagnosis not present

## 2022-10-16 DIAGNOSIS — D518 Other vitamin B12 deficiency anemias: Secondary | ICD-10-CM | POA: Diagnosis not present

## 2022-10-16 DIAGNOSIS — I10 Essential (primary) hypertension: Secondary | ICD-10-CM | POA: Diagnosis not present

## 2022-10-23 ENCOUNTER — Ambulatory Visit (INDEPENDENT_AMBULATORY_CARE_PROVIDER_SITE_OTHER): Payer: Medicare Other

## 2022-10-23 DIAGNOSIS — I495 Sick sinus syndrome: Secondary | ICD-10-CM | POA: Diagnosis not present

## 2022-10-24 LAB — CUP PACEART REMOTE DEVICE CHECK
Battery Impedance: 3799 Ohm
Battery Remaining Longevity: 16 mo
Battery Voltage: 2.69 V
Brady Statistic AP VP Percent: 1 %
Brady Statistic AP VS Percent: 78 %
Brady Statistic AS VP Percent: 0 %
Brady Statistic AS VS Percent: 21 %
Date Time Interrogation Session: 20240422094157
Implantable Lead Connection Status: 753985
Implantable Lead Connection Status: 753985
Implantable Lead Implant Date: 20100215
Implantable Lead Implant Date: 20100215
Implantable Lead Location: 753859
Implantable Lead Location: 753860
Implantable Lead Model: 5076
Implantable Lead Model: 5076
Implantable Pulse Generator Implant Date: 20100215
Lead Channel Impedance Value: 459 Ohm
Lead Channel Impedance Value: 673 Ohm
Lead Channel Pacing Threshold Amplitude: 0.5 V
Lead Channel Pacing Threshold Amplitude: 1.25 V
Lead Channel Pacing Threshold Pulse Width: 0.4 ms
Lead Channel Pacing Threshold Pulse Width: 0.4 ms
Lead Channel Setting Pacing Amplitude: 2 V
Lead Channel Setting Pacing Amplitude: 2.5 V
Lead Channel Setting Pacing Pulse Width: 0.4 ms
Lead Channel Setting Sensing Sensitivity: 2.8 mV
Zone Setting Status: 755011
Zone Setting Status: 755011

## 2022-10-30 DIAGNOSIS — I10 Essential (primary) hypertension: Secondary | ICD-10-CM | POA: Diagnosis not present

## 2022-10-31 DIAGNOSIS — E785 Hyperlipidemia, unspecified: Secondary | ICD-10-CM | POA: Diagnosis not present

## 2022-10-31 DIAGNOSIS — R1310 Dysphagia, unspecified: Secondary | ICD-10-CM | POA: Diagnosis not present

## 2022-10-31 DIAGNOSIS — J302 Other seasonal allergic rhinitis: Secondary | ICD-10-CM | POA: Diagnosis not present

## 2022-10-31 DIAGNOSIS — E559 Vitamin D deficiency, unspecified: Secondary | ICD-10-CM | POA: Diagnosis not present

## 2022-10-31 DIAGNOSIS — I10 Essential (primary) hypertension: Secondary | ICD-10-CM | POA: Diagnosis not present

## 2022-10-31 DIAGNOSIS — K219 Gastro-esophageal reflux disease without esophagitis: Secondary | ICD-10-CM | POA: Diagnosis not present

## 2022-10-31 DIAGNOSIS — E538 Deficiency of other specified B group vitamins: Secondary | ICD-10-CM | POA: Diagnosis not present

## 2022-10-31 DIAGNOSIS — G4700 Insomnia, unspecified: Secondary | ICD-10-CM | POA: Diagnosis not present

## 2022-10-31 DIAGNOSIS — I480 Paroxysmal atrial fibrillation: Secondary | ICD-10-CM | POA: Diagnosis not present

## 2022-11-07 DIAGNOSIS — R296 Repeated falls: Secondary | ICD-10-CM | POA: Diagnosis not present

## 2022-11-07 DIAGNOSIS — I700 Atherosclerosis of aorta: Secondary | ICD-10-CM | POA: Diagnosis not present

## 2022-11-07 DIAGNOSIS — Z743 Need for continuous supervision: Secondary | ICD-10-CM | POA: Diagnosis not present

## 2022-11-07 DIAGNOSIS — Z87891 Personal history of nicotine dependence: Secondary | ICD-10-CM | POA: Diagnosis not present

## 2022-11-07 DIAGNOSIS — W19XXXA Unspecified fall, initial encounter: Secondary | ICD-10-CM | POA: Diagnosis not present

## 2022-11-07 DIAGNOSIS — Z8673 Personal history of transient ischemic attack (TIA), and cerebral infarction without residual deficits: Secondary | ICD-10-CM | POA: Diagnosis not present

## 2022-11-07 DIAGNOSIS — I10 Essential (primary) hypertension: Secondary | ICD-10-CM | POA: Diagnosis not present

## 2022-11-07 DIAGNOSIS — M4856XA Collapsed vertebra, not elsewhere classified, lumbar region, initial encounter for fracture: Secondary | ICD-10-CM | POA: Diagnosis not present

## 2022-11-07 DIAGNOSIS — E785 Hyperlipidemia, unspecified: Secondary | ICD-10-CM | POA: Diagnosis not present

## 2022-11-07 DIAGNOSIS — M4317 Spondylolisthesis, lumbosacral region: Secondary | ICD-10-CM | POA: Diagnosis not present

## 2022-11-07 DIAGNOSIS — M25572 Pain in left ankle and joints of left foot: Secondary | ICD-10-CM | POA: Diagnosis not present

## 2022-11-07 DIAGNOSIS — I2510 Atherosclerotic heart disease of native coronary artery without angina pectoris: Secondary | ICD-10-CM | POA: Diagnosis not present

## 2022-11-07 DIAGNOSIS — M25551 Pain in right hip: Secondary | ICD-10-CM | POA: Diagnosis not present

## 2022-11-07 DIAGNOSIS — K5730 Diverticulosis of large intestine without perforation or abscess without bleeding: Secondary | ICD-10-CM | POA: Diagnosis not present

## 2022-11-07 DIAGNOSIS — I679 Cerebrovascular disease, unspecified: Secondary | ICD-10-CM | POA: Diagnosis not present

## 2022-11-07 DIAGNOSIS — Z043 Encounter for examination and observation following other accident: Secondary | ICD-10-CM | POA: Diagnosis not present

## 2022-11-07 DIAGNOSIS — G319 Degenerative disease of nervous system, unspecified: Secondary | ICD-10-CM | POA: Diagnosis not present

## 2022-11-07 DIAGNOSIS — S0990XA Unspecified injury of head, initial encounter: Secondary | ICD-10-CM | POA: Diagnosis not present

## 2022-11-12 DIAGNOSIS — D518 Other vitamin B12 deficiency anemias: Secondary | ICD-10-CM | POA: Diagnosis not present

## 2022-11-12 DIAGNOSIS — E7849 Other hyperlipidemia: Secondary | ICD-10-CM | POA: Diagnosis not present

## 2022-11-12 DIAGNOSIS — I70223 Atherosclerosis of native arteries of extremities with rest pain, bilateral legs: Secondary | ICD-10-CM | POA: Diagnosis not present

## 2022-11-12 DIAGNOSIS — E559 Vitamin D deficiency, unspecified: Secondary | ICD-10-CM | POA: Diagnosis not present

## 2022-11-12 DIAGNOSIS — E119 Type 2 diabetes mellitus without complications: Secondary | ICD-10-CM | POA: Diagnosis not present

## 2022-11-12 DIAGNOSIS — I10 Essential (primary) hypertension: Secondary | ICD-10-CM | POA: Diagnosis not present

## 2022-11-12 DIAGNOSIS — I480 Paroxysmal atrial fibrillation: Secondary | ICD-10-CM | POA: Diagnosis not present

## 2022-11-12 DIAGNOSIS — E038 Other specified hypothyroidism: Secondary | ICD-10-CM | POA: Diagnosis not present

## 2022-11-14 DIAGNOSIS — R051 Acute cough: Secondary | ICD-10-CM | POA: Diagnosis not present

## 2022-11-14 DIAGNOSIS — J3489 Other specified disorders of nose and nasal sinuses: Secondary | ICD-10-CM | POA: Diagnosis not present

## 2022-11-14 DIAGNOSIS — R058 Other specified cough: Secondary | ICD-10-CM | POA: Diagnosis not present

## 2022-11-21 DIAGNOSIS — J40 Bronchitis, not specified as acute or chronic: Secondary | ICD-10-CM | POA: Diagnosis not present

## 2022-11-21 DIAGNOSIS — B9689 Other specified bacterial agents as the cause of diseases classified elsewhere: Secondary | ICD-10-CM | POA: Diagnosis not present

## 2022-11-24 NOTE — Progress Notes (Signed)
Remote pacemaker transmission.   

## 2022-11-28 DIAGNOSIS — E785 Hyperlipidemia, unspecified: Secondary | ICD-10-CM | POA: Diagnosis not present

## 2022-11-28 DIAGNOSIS — D649 Anemia, unspecified: Secondary | ICD-10-CM | POA: Diagnosis not present

## 2022-11-28 DIAGNOSIS — I10 Essential (primary) hypertension: Secondary | ICD-10-CM | POA: Diagnosis not present

## 2022-11-28 DIAGNOSIS — R1310 Dysphagia, unspecified: Secondary | ICD-10-CM | POA: Diagnosis not present

## 2022-11-28 DIAGNOSIS — E559 Vitamin D deficiency, unspecified: Secondary | ICD-10-CM | POA: Diagnosis not present

## 2022-11-28 DIAGNOSIS — J302 Other seasonal allergic rhinitis: Secondary | ICD-10-CM | POA: Diagnosis not present

## 2022-11-28 DIAGNOSIS — G4700 Insomnia, unspecified: Secondary | ICD-10-CM | POA: Diagnosis not present

## 2022-11-28 DIAGNOSIS — I480 Paroxysmal atrial fibrillation: Secondary | ICD-10-CM | POA: Diagnosis not present

## 2022-11-28 DIAGNOSIS — E538 Deficiency of other specified B group vitamins: Secondary | ICD-10-CM | POA: Diagnosis not present

## 2022-11-29 DIAGNOSIS — I10 Essential (primary) hypertension: Secondary | ICD-10-CM | POA: Diagnosis not present

## 2022-12-07 DIAGNOSIS — D518 Other vitamin B12 deficiency anemias: Secondary | ICD-10-CM | POA: Diagnosis not present

## 2022-12-07 DIAGNOSIS — Z79899 Other long term (current) drug therapy: Secondary | ICD-10-CM | POA: Diagnosis not present

## 2022-12-07 DIAGNOSIS — E038 Other specified hypothyroidism: Secondary | ICD-10-CM | POA: Diagnosis not present

## 2022-12-07 DIAGNOSIS — E782 Mixed hyperlipidemia: Secondary | ICD-10-CM | POA: Diagnosis not present

## 2022-12-07 DIAGNOSIS — E119 Type 2 diabetes mellitus without complications: Secondary | ICD-10-CM | POA: Diagnosis not present

## 2022-12-14 DIAGNOSIS — R451 Restlessness and agitation: Secondary | ICD-10-CM | POA: Diagnosis not present

## 2022-12-14 DIAGNOSIS — F02C2 Dementia in other diseases classified elsewhere, severe, with psychotic disturbance: Secondary | ICD-10-CM | POA: Diagnosis not present

## 2022-12-14 DIAGNOSIS — G309 Alzheimer's disease, unspecified: Secondary | ICD-10-CM | POA: Diagnosis not present

## 2022-12-15 DIAGNOSIS — Z91048 Other nonmedicinal substance allergy status: Secondary | ICD-10-CM | POA: Diagnosis not present

## 2022-12-15 DIAGNOSIS — Z880 Allergy status to penicillin: Secondary | ICD-10-CM | POA: Diagnosis not present

## 2022-12-15 DIAGNOSIS — Z743 Need for continuous supervision: Secondary | ICD-10-CM | POA: Diagnosis not present

## 2022-12-15 DIAGNOSIS — E559 Vitamin D deficiency, unspecified: Secondary | ICD-10-CM | POA: Diagnosis not present

## 2022-12-15 DIAGNOSIS — Z87891 Personal history of nicotine dependence: Secondary | ICD-10-CM | POA: Diagnosis not present

## 2022-12-15 DIAGNOSIS — E871 Hypo-osmolality and hyponatremia: Secondary | ICD-10-CM | POA: Diagnosis not present

## 2022-12-15 DIAGNOSIS — D518 Other vitamin B12 deficiency anemias: Secondary | ICD-10-CM | POA: Diagnosis not present

## 2022-12-15 DIAGNOSIS — Z8673 Personal history of transient ischemic attack (TIA), and cerebral infarction without residual deficits: Secondary | ICD-10-CM | POA: Diagnosis not present

## 2022-12-15 DIAGNOSIS — Z887 Allergy status to serum and vaccine status: Secondary | ICD-10-CM | POA: Diagnosis not present

## 2022-12-15 DIAGNOSIS — I499 Cardiac arrhythmia, unspecified: Secondary | ICD-10-CM | POA: Diagnosis not present

## 2022-12-15 DIAGNOSIS — E038 Other specified hypothyroidism: Secondary | ICD-10-CM | POA: Diagnosis not present

## 2022-12-15 DIAGNOSIS — I70223 Atherosclerosis of native arteries of extremities with rest pain, bilateral legs: Secondary | ICD-10-CM | POA: Diagnosis not present

## 2022-12-15 DIAGNOSIS — I480 Paroxysmal atrial fibrillation: Secondary | ICD-10-CM | POA: Diagnosis not present

## 2022-12-15 DIAGNOSIS — Z881 Allergy status to other antibiotic agents status: Secondary | ICD-10-CM | POA: Diagnosis not present

## 2022-12-15 DIAGNOSIS — I10 Essential (primary) hypertension: Secondary | ICD-10-CM | POA: Diagnosis not present

## 2022-12-15 DIAGNOSIS — E785 Hyperlipidemia, unspecified: Secondary | ICD-10-CM | POA: Diagnosis not present

## 2022-12-15 DIAGNOSIS — E7849 Other hyperlipidemia: Secondary | ICD-10-CM | POA: Diagnosis not present

## 2022-12-15 DIAGNOSIS — E119 Type 2 diabetes mellitus without complications: Secondary | ICD-10-CM | POA: Diagnosis not present

## 2022-12-18 ENCOUNTER — Inpatient Hospital Stay (HOSPITAL_COMMUNITY)
Admission: EM | Admit: 2022-12-18 | Discharge: 2022-12-22 | DRG: 064 | Disposition: A | Discharge status: Hospice | Payer: Medicare Other | Source: Skilled Nursing Facility | Attending: Family Medicine | Admitting: Family Medicine

## 2022-12-18 ENCOUNTER — Encounter (HOSPITAL_COMMUNITY): Payer: Self-pay

## 2022-12-18 ENCOUNTER — Other Ambulatory Visit: Payer: Self-pay

## 2022-12-18 ENCOUNTER — Emergency Department (HOSPITAL_COMMUNITY): Payer: Medicare Other

## 2022-12-18 ENCOUNTER — Observation Stay (HOSPITAL_COMMUNITY)
Admit: 2022-12-18 | Discharge: 2022-12-18 | Disposition: A | Discharge status: Home / Self Care | Payer: Medicare Other | Attending: Internal Medicine | Admitting: Internal Medicine

## 2022-12-18 ENCOUNTER — Observation Stay (HOSPITAL_COMMUNITY): Payer: Medicare Other

## 2022-12-18 DIAGNOSIS — Z8249 Family history of ischemic heart disease and other diseases of the circulatory system: Secondary | ICD-10-CM

## 2022-12-18 DIAGNOSIS — Z8719 Personal history of other diseases of the digestive system: Secondary | ICD-10-CM | POA: Diagnosis not present

## 2022-12-18 DIAGNOSIS — R64 Cachexia: Secondary | ICD-10-CM | POA: Diagnosis present

## 2022-12-18 DIAGNOSIS — Z887 Allergy status to serum and vaccine status: Secondary | ICD-10-CM | POA: Diagnosis not present

## 2022-12-18 DIAGNOSIS — I480 Paroxysmal atrial fibrillation: Secondary | ICD-10-CM | POA: Diagnosis present

## 2022-12-18 DIAGNOSIS — R569 Unspecified convulsions: Secondary | ICD-10-CM | POA: Diagnosis not present

## 2022-12-18 DIAGNOSIS — Z993 Dependence on wheelchair: Secondary | ICD-10-CM

## 2022-12-18 DIAGNOSIS — Z833 Family history of diabetes mellitus: Secondary | ICD-10-CM | POA: Diagnosis not present

## 2022-12-18 DIAGNOSIS — F0390 Unspecified dementia without behavioral disturbance: Secondary | ICD-10-CM | POA: Diagnosis present

## 2022-12-18 DIAGNOSIS — Z681 Body mass index (BMI) 19 or less, adult: Secondary | ICD-10-CM | POA: Diagnosis not present

## 2022-12-18 DIAGNOSIS — E7800 Pure hypercholesterolemia, unspecified: Secondary | ICD-10-CM | POA: Diagnosis present

## 2022-12-18 DIAGNOSIS — I69354 Hemiplegia and hemiparesis following cerebral infarction affecting left non-dominant side: Secondary | ICD-10-CM | POA: Diagnosis not present

## 2022-12-18 DIAGNOSIS — E878 Other disorders of electrolyte and fluid balance, not elsewhere classified: Secondary | ICD-10-CM | POA: Diagnosis present

## 2022-12-18 DIAGNOSIS — Z87891 Personal history of nicotine dependence: Secondary | ICD-10-CM | POA: Diagnosis not present

## 2022-12-18 DIAGNOSIS — Z743 Need for continuous supervision: Secondary | ICD-10-CM | POA: Diagnosis not present

## 2022-12-18 DIAGNOSIS — E871 Hypo-osmolality and hyponatremia: Secondary | ICD-10-CM | POA: Diagnosis not present

## 2022-12-18 DIAGNOSIS — G9341 Metabolic encephalopathy: Secondary | ICD-10-CM | POA: Diagnosis not present

## 2022-12-18 DIAGNOSIS — Z66 Do not resuscitate: Secondary | ICD-10-CM | POA: Diagnosis not present

## 2022-12-18 DIAGNOSIS — I10 Essential (primary) hypertension: Secondary | ICD-10-CM | POA: Diagnosis not present

## 2022-12-18 DIAGNOSIS — F03C11 Unspecified dementia, severe, with agitation: Secondary | ICD-10-CM | POA: Diagnosis not present

## 2022-12-18 DIAGNOSIS — Z79899 Other long term (current) drug therapy: Secondary | ICD-10-CM

## 2022-12-18 DIAGNOSIS — Z880 Allergy status to penicillin: Secondary | ICD-10-CM

## 2022-12-18 DIAGNOSIS — I499 Cardiac arrhythmia, unspecified: Secondary | ICD-10-CM | POA: Diagnosis not present

## 2022-12-18 DIAGNOSIS — Z91048 Other nonmedicinal substance allergy status: Secondary | ICD-10-CM | POA: Diagnosis not present

## 2022-12-18 DIAGNOSIS — K219 Gastro-esophageal reflux disease without esophagitis: Secondary | ICD-10-CM | POA: Diagnosis present

## 2022-12-18 DIAGNOSIS — Z515 Encounter for palliative care: Secondary | ICD-10-CM

## 2022-12-18 DIAGNOSIS — R000 Tachycardia, unspecified: Secondary | ICD-10-CM | POA: Diagnosis not present

## 2022-12-18 DIAGNOSIS — I495 Sick sinus syndrome: Secondary | ICD-10-CM | POA: Diagnosis not present

## 2022-12-18 DIAGNOSIS — R531 Weakness: Secondary | ICD-10-CM | POA: Diagnosis not present

## 2022-12-18 DIAGNOSIS — Z950 Presence of cardiac pacemaker: Secondary | ICD-10-CM | POA: Diagnosis not present

## 2022-12-18 DIAGNOSIS — F03C0 Unspecified dementia, severe, without behavioral disturbance, psychotic disturbance, mood disturbance, and anxiety: Secondary | ICD-10-CM | POA: Diagnosis present

## 2022-12-18 DIAGNOSIS — R1310 Dysphagia, unspecified: Secondary | ICD-10-CM

## 2022-12-18 DIAGNOSIS — R634 Abnormal weight loss: Secondary | ICD-10-CM | POA: Diagnosis not present

## 2022-12-18 DIAGNOSIS — Z888 Allergy status to other drugs, medicaments and biological substances status: Secondary | ICD-10-CM | POA: Diagnosis not present

## 2022-12-18 DIAGNOSIS — I6200 Nontraumatic subdural hemorrhage, unspecified: Secondary | ICD-10-CM | POA: Diagnosis present

## 2022-12-18 DIAGNOSIS — G9340 Encephalopathy, unspecified: Secondary | ICD-10-CM | POA: Diagnosis not present

## 2022-12-18 DIAGNOSIS — R404 Transient alteration of awareness: Secondary | ICD-10-CM | POA: Diagnosis not present

## 2022-12-18 DIAGNOSIS — Z7189 Other specified counseling: Secondary | ICD-10-CM | POA: Diagnosis not present

## 2022-12-18 DIAGNOSIS — R627 Adult failure to thrive: Secondary | ICD-10-CM | POA: Diagnosis not present

## 2022-12-18 DIAGNOSIS — Z881 Allergy status to other antibiotic agents status: Secondary | ICD-10-CM | POA: Diagnosis not present

## 2022-12-18 DIAGNOSIS — I6201 Nontraumatic acute subdural hemorrhage: Secondary | ICD-10-CM | POA: Diagnosis not present

## 2022-12-18 DIAGNOSIS — S065XAA Traumatic subdural hemorrhage with loss of consciousness status unknown, initial encounter: Secondary | ICD-10-CM | POA: Diagnosis not present

## 2022-12-18 DIAGNOSIS — R54 Age-related physical debility: Secondary | ICD-10-CM | POA: Diagnosis present

## 2022-12-18 DIAGNOSIS — R4182 Altered mental status, unspecified: Secondary | ICD-10-CM | POA: Diagnosis present

## 2022-12-18 LAB — COMPREHENSIVE METABOLIC PANEL
ALT: 10 U/L (ref 0–44)
AST: 13 U/L — ABNORMAL LOW (ref 15–41)
Albumin: 3.6 g/dL (ref 3.5–5.0)
Alkaline Phosphatase: 75 U/L (ref 38–126)
Anion gap: 9 (ref 5–15)
BUN: 15 mg/dL (ref 8–23)
CO2: 26 mmol/L (ref 22–32)
Calcium: 9 mg/dL (ref 8.9–10.3)
Chloride: 97 mmol/L — ABNORMAL LOW (ref 98–111)
Creatinine, Ser: 0.98 mg/dL (ref 0.44–1.00)
GFR, Estimated: 52 mL/min — ABNORMAL LOW (ref >60)
Glucose, Bld: 109 mg/dL — ABNORMAL HIGH (ref 70–99)
Potassium: 3.5 mmol/L (ref 3.5–5.1)
Sodium: 132 mmol/L — ABNORMAL LOW (ref 135–145)
Total Bilirubin: 0.5 mg/dL (ref 0.3–1.2)
Total Protein: 7 g/dL (ref 6.5–8.1)

## 2022-12-18 LAB — URINALYSIS, W/ REFLEX TO CULTURE (INFECTION SUSPECTED)
Bacteria, UA: NONE SEEN
Bilirubin Urine: NEGATIVE
Glucose, UA: NEGATIVE mg/dL
Hgb urine dipstick: NEGATIVE
Ketones, ur: NEGATIVE mg/dL
Leukocytes,Ua: NEGATIVE
Nitrite: NEGATIVE
Protein, ur: NEGATIVE mg/dL
Specific Gravity, Urine: 1.005 (ref 1.005–1.030)
pH: 6 (ref 5.0–8.0)

## 2022-12-18 LAB — CBC WITH DIFFERENTIAL/PLATELET
Abs Immature Granulocytes: 0.02 10*3/uL (ref 0.00–0.07)
Basophils Absolute: 0.1 10*3/uL (ref 0.0–0.1)
Basophils Relative: 1 %
Eosinophils Absolute: 0.2 10*3/uL (ref 0.0–0.5)
Eosinophils Relative: 4 %
HCT: 35.7 % — ABNORMAL LOW (ref 36.0–46.0)
Hemoglobin: 11.9 g/dL — ABNORMAL LOW (ref 12.0–15.0)
Immature Granulocytes: 0 %
Lymphocytes Relative: 26 %
Lymphs Abs: 1.4 10*3/uL (ref 0.7–4.0)
MCH: 30.4 pg (ref 26.0–34.0)
MCHC: 33.3 g/dL (ref 30.0–36.0)
MCV: 91.1 fL (ref 80.0–100.0)
Monocytes Absolute: 0.5 10*3/uL (ref 0.1–1.0)
Monocytes Relative: 9 %
Neutro Abs: 3.3 10*3/uL (ref 1.7–7.7)
Neutrophils Relative %: 60 %
Platelets: 275 10*3/uL (ref 150–400)
RBC: 3.92 MIL/uL (ref 3.87–5.11)
RDW: 15 % (ref 11.5–15.5)
WBC: 5.4 10*3/uL (ref 4.0–10.5)
nRBC: 0 % (ref 0.0–0.2)

## 2022-12-18 LAB — I-STAT CHEM 8, ED
BUN: 14 mg/dL (ref 8–23)
Calcium, Ion: 1.27 mmol/L (ref 1.15–1.40)
Chloride: 97 mmol/L — ABNORMAL LOW (ref 98–111)
Creatinine, Ser: 1 mg/dL (ref 0.44–1.00)
Glucose, Bld: 109 mg/dL — ABNORMAL HIGH (ref 70–99)
HCT: 40 % (ref 36.0–46.0)
Hemoglobin: 13.6 g/dL (ref 12.0–15.0)
Potassium: 3.7 mmol/L (ref 3.5–5.1)
Sodium: 135 mmol/L (ref 135–145)
TCO2: 28 mmol/L (ref 22–32)

## 2022-12-18 LAB — MAGNESIUM: Magnesium: 2 mg/dL (ref 1.7–2.4)

## 2022-12-18 LAB — BLOOD GAS, VENOUS
Acid-Base Excess: 2.8 mmol/L — ABNORMAL HIGH (ref 0.0–2.0)
Bicarbonate: 27.2 mmol/L (ref 20.0–28.0)
Drawn by: 66460
O2 Saturation: 88.7 %
Patient temperature: 36.5
pCO2, Ven: 39 mmHg — ABNORMAL LOW (ref 44–60)
pH, Ven: 7.45 — ABNORMAL HIGH (ref 7.25–7.43)
pO2, Ven: 51 mmHg — ABNORMAL HIGH (ref 32–45)

## 2022-12-18 LAB — TROPONIN I (HIGH SENSITIVITY)
Troponin I (High Sensitivity): 18 ng/L — ABNORMAL HIGH (ref <18)
Troponin I (High Sensitivity): 18 ng/L — ABNORMAL HIGH (ref <18)

## 2022-12-18 LAB — LACTIC ACID, PLASMA: Lactic Acid, Venous: 0.6 mmol/L (ref 0.5–1.9)

## 2022-12-18 LAB — CBG MONITORING, ED: Glucose-Capillary: 94 mg/dL (ref 70–99)

## 2022-12-18 LAB — MRSA NEXT GEN BY PCR, NASAL: MRSA by PCR Next Gen: NOT DETECTED

## 2022-12-18 LAB — AMMONIA: Ammonia: <10 umol/L (ref 9–35)

## 2022-12-18 MED ORDER — PANTOPRAZOLE SODIUM 40 MG IV SOLR
40.0000 mg | INTRAVENOUS | Status: DC
  Administered 2022-12-18 – 2022-12-21 (×4): 40 mg via INTRAVENOUS
  Filled 2022-12-18 (×4): qty 10

## 2022-12-18 MED ORDER — METOPROLOL TARTRATE 25 MG PO TABS
12.5000 mg | ORAL_TABLET | Freq: Two times a day (BID) | ORAL | Status: DC
  Filled 2022-12-18: qty 1

## 2022-12-18 MED ORDER — HYDRALAZINE HCL 20 MG/ML IJ SOLN
10.0000 mg | Freq: Four times a day (QID) | INTRAMUSCULAR | Status: DC | PRN
  Administered 2022-12-18: 10 mg via INTRAVENOUS
  Filled 2022-12-18: qty 1

## 2022-12-18 MED ORDER — AMLODIPINE BESYLATE 5 MG PO TABS
5.0000 mg | ORAL_TABLET | Freq: Every day | ORAL | Status: DC
  Filled 2022-12-18: qty 1

## 2022-12-18 NOTE — ED Notes (Signed)
MD at bedside. 

## 2022-12-18 NOTE — ED Notes (Signed)
Patient transported to CT 

## 2022-12-18 NOTE — H&P (Addendum)
History and Physical    Patient: Kelsey Thompson ZOX:[id number] DOB: 1920-08-22 DOA: 12/18/2022 DOS: the patient was seen and examined on 12/18/2022 PCP: System, Provider Not In  Patient coming from: ALF/ILF  Chief Complaint:  Chief Complaint  Patient presents with   Altered Mental Status   HPI: Kelsey Thompson is a 89 year old female with a history of paroxysmal atrial fibrillation, hypertension, stroke with left hemiparesis, hyperlipidemia, dementia, GERD presenting with altered mental status from [facility].  Interestingly, it was noted that the staff at [facility] noted the patient to be at her usual baseline at 3 AM on the morning of 12/18/2022.  However, it is unclear exactly what they were doing with the patient at 3 AM. Nevertheless, the patient's daughter at the bedside supplements the history.  At baseline, the patient is essentially wheelchair-bound needing assistance with transfers, feeding, and dressing.  The patient is able to speak otherwise pleasantly confused.  Daughter states that the patient was at her usual baseline in the afternoon of 12/17/2022.  Daughter was contacted by [facility] the morning of 12/18/22 telling her that they were sending the patient to the hospital for altered mental status.  Notably, the patient was sent to the emergency department at [facility] on 12/15/22 for confusion and combativeness.  Review the medical record shows that the patient was sent there for a psych evaluation and to obtain a urinalysis as requested by her assisted living facility.  Routine blood work showed a sodium 130, serum creatinine 0.7, potassium 3.5, bicarbonate 26.  LFTs were unremarkable.  Urinalysis was negative for significant pyuria.  CBC showed WBC 5.9, hemoglobin 10.4, platelets 224,000.  Imaging of the brain was not obtained.  The patient was discharged back to [facility] in stable condition. Daughter states that she saw the patient on 12/16/2022 after she returned from the ED, and felt  that the patient was near her usual baseline.  Daughter states that there has been no new complaints.  Specifically, no fevers, chills, chest pain, shortness breath, abdominal pain, vomiting, diarrhea.  There is been no recent falls in the past month.  There is not been any new medications.  In the ED at [facility], the patient was afebrile and hemodynamically stable with oxygen saturation 97% on room air.  WBC 5.4, hemoglobin 9.9, platelets 225,000.  Sodium 132, potassium 3.5, bicarbonate 26, serum creatinine 0.98.  CT of the brain had motion artifact with possible thin SDH overlying the left lateral frontal lobe versus possible artifact.  Chest x-ray showed hyperinflation with questionable RLL opacity.  CT of the chest was negative for consolidation.  CT of the abdomen and pelvis was negative for any colon wall thickening or obstruction.  There is no acute findings in the CT of the abdomen.  VBG showed 7.4 5/39/51/27.  Ammonia was less than 10.  Troponin 18>> 18.  Patient was admitted for further evaluation and treatment of her altered mental status.  Review of Systems: As mentioned in the history of present illness. All other systems reviewed and are negative. Past Medical History:  Diagnosis Date   Arthritis    Bradycardia    GERD (gastroesophageal reflux disease)    Hiatal hernia    Hypercholesterolemia    Hypertension    Pacemaker    Stroke [facility])    Past Surgical History:  Procedure Laterality Date   APPENDECTOMY     CESAREAN SECTION     ESOPHAGOGASTRODUODENOSCOPY (EGD) WITH PROPOFOL N/A 03/25/2022   Procedure: ESOPHAGOGASTRODUODENOSCOPY (EGD) WITH PROPOFOL;  Surgeon: Corbin Ade, MD;  [facility];  Service: Endoscopy;  Laterality: N/A;   ESOPHAGOGASTRODUODENOSCOPY (EGD) WITH PROPOFOL N/A 03/27/2022   Procedure: ESOPHAGOGASTRODUODENOSCOPY (EGD) WITH PROPOFOL;  Surgeon: Lanelle Bal, DO;  Location: [facility];  Service: Endoscopy;  Laterality: N/A;   HOT  HEMOSTASIS  03/27/2022   Procedure: HOT HEMOSTASIS (ARGON PLASMA COAGULATION/BICAP);  Surgeon: Lanelle Bal, DO;  Location: [facility];  Service: Endoscopy;;  gold probe   PACEMAKER INSERTION     SUBMUCOSAL INJECTION  03/27/2022   Procedure: SUBMUCOSAL INJECTION;  Surgeon: Lanelle Bal, DO;  Location: [facility];  Service: Endoscopy;;   TONSILLECTOMY     Social History:  reports that she quit smoking about 41 years ago. Her smoking use included cigarettes. She has been exposed to tobacco smoke. She has never used smokeless tobacco. She reports that she does not drink alcohol and does not use drugs.  Allergies  Allergen Reactions   Cefdinir     Per MAR   Horse-Derived Products Other (See Comments)    Tetanus:Reaction unknown   Penicillins Other (See Comments)    Did it involve swelling of the face/tongue/throat, SOB, or low BP? Yes-swelling Did it involve sudden or severe rash/hives, skin peeling, or any reaction on the inside of your mouth or nose? No Did you need to seek medical attention at a hospital or doctor's office? Yes-Dr. office When did it last happen?  4 years prior     If all above answers are "NO", may proceed with cephalosporin use.    Tetanus Antitoxin     Unknown reaction    Family History  Problem Relation Age of Onset   Heart failure Mother    Diabetes Father    Heart attack Father     Prior to Admission medications   Medication Sig Start Date End Date Taking? Authorizing Provider  acetaminophen (TYLENOL) 500 MG tablet Take 500 mg by mouth every 6 (six) hours as needed for mild pain or moderate pain.    [provider]  amLODipine (NORVASC) 5 MG tablet Take 1 tablet (5 mg total) by mouth daily. 04/07/19   Mechele Claude, MD  calcium-vitamin D (OSCAL WITH D) 500-200 MG-UNIT per tablet Take 1 tablet by mouth daily.    [provider]  cholecalciferol (VITAMIN D3) 25 MCG (1000 UNIT) tablet Take 1,000 Units by mouth daily.     [provider]  cyanocobalamin 1000 MCG tablet Take 1,000 mcg by mouth daily.    [provider]  ferrous sulfate 325 (65 FE) MG tablet Take 1 tablet (325 mg total) by mouth daily with breakfast. 04/07/19   Mechele Claude, MD  metoprolol tartrate (LOPRESSOR) 25 MG tablet Take 0.5 tablets (12.5 mg total) by mouth 2 (two) times daily. 04/03/22   Erick Blinks, MD  Oyster Shell 500 MG TABS Take 1 tablet by mouth daily.    [provider]  pantoprazole (PROTONIX) 40 MG tablet Take 1 tablet (40 mg total) by mouth 2 (two) times daily. 04/03/22   Erick Blinks, MD  Propylene Glycol (SYSTANE COMPLETE OP) Apply 1 drop to eye 4 (four) times daily.    [provider]  QUEtiapine (SEROQUEL) 25 MG tablet Take 0.5 tablets (12.5 mg total) by mouth at bedtime. 04/03/22   Erick Blinks, MD  sodium chloride 1 g tablet Take 1 g by mouth 2 (two) times daily.    [provider]  traZODone (DESYREL) 100 MG tablet Take 1  tablet (100 mg total) by mouth at bedtime. 04/03/22   Erick Blinks, MD  vitamin C (ASCORBIC ACID) 500 MG tablet Take 500 mg by mouth daily.    [provider]    Physical Exam: Vitals:   12/18/22 1115 12/18/22 1200 12/18/22 1215 12/18/22 1240  BP: (!) 167/90 (!) 143/73 (!) 150/84   Pulse: 60 60 60   Resp: 18 12 12    Temp:    97.6 F (36.4 C)  TempSrc:    Axillary  SpO2: 94% 95% 96%   Weight:      Height:       GENERAL:  Awake and moans, NAD, well developed, does not follows commands HEENT: [place]/AT, No thrush, No icterus, No oral ulcers Neck:  No neck mass, No meningismus, soft, supple CV: RRR, no S3, no S4, no rub, no JVD Lungs: Bibasilar crackles.  No wheezing. Abd: soft/NT +BS, nondistended Ext: No edema, no lymphangitis, no cyanosis, no rashes Neuro: No gaze preference.  Moves all 4 extremities antigravity.  Withdraws to protopathic stimuli.  Data Reviewed: Data reviewed above in the history  Assessment and Plan: Acute  metabolic encephalopathy -Etiology unclear -Workup in progress -12/18/2022 CT brain--possible thin SDH overlying left lateral frontal lobe versus artifact -Plan to repeat CT brain later this afternoon -EEG -B12 -TSH -Ammonia <10 -UA negative for pyuria  Paroxysmal atrial fibrillation -Currently not on anticoagulation secondary to GI bleed and advanced age -Continue metoprolol tartrate -Rate controlled  Essential hypertension -Continue amlodipine and metoprolol if able to tolerate p.o.  Dementia without behavioral disturbance -Continue trazodone and Seroquel home doses  Hyponatremia -Continue sodium chloride -start NS  GERD -Continue pantoprazole  History of stroke with left hemiparesis -PT evaluation if mental status improves  Dysphagia -Patient is currently on a dysphagia 1 diet -Speech therapy evaluation if the patient's mental status improves  Sinus node dysfunction -s/p PPM    Advance Care Planning: DNR--confirmed with daughter  Consults: none  Family Communication: daughter 6/17  Severity of Illness: The appropriate patient status for this patient is OBSERVATION. Observation status is judged to be reasonable and necessary in order to provide the required intensity of service to ensure the patient's safety. The patient's presenting symptoms, physical exam findings, and initial radiographic and laboratory data in the context of their medical condition is felt to place them at decreased risk for further clinical deterioration. Furthermore, it is anticipated that the patient will be medically stable for discharge from the hospital within 2 midnights of admission.   Author: Catarina Hartshorn, MD 12/18/2022 12:45 PM  For on call review [email or web address].

## 2022-12-18 NOTE — ED Triage Notes (Signed)
Pt BIB [facility] with reports of AMS. Facility reports pt was normal at 0300 this morning. At this time unknown of what patients baseline is. BS 94.

## 2022-12-18 NOTE — ED Provider Notes (Signed)
[facility] Provider Note   CSN: [id number] Arrival date & time: 12/18/22  0732     History  Chief Complaint  Patient presents with   Altered Mental Status    Kelsey Thompson is a 89 y.o. female.   Altered Mental Status Patient presents for altered mental status.  Medical history includes HTN, GERD, sinoatrial node dysfunction s/p pacemaker, CVA with residual left-sided hemiparesis, atrial fibrillation, HLD.  She was seen in outside hospital ED 3 days ago for altered mental status.  At the time, this was described as confusion and combativeness.  In the ED, she was reportedly calm and cooperative.  Her lab work at the time was notable for a sodium of 130.  She arrives today via EMS.  EMS is unclear of what her baseline is.  She was reportedly at her baseline as late as 3 AM this morning when they checked on her at the nursing home.  This morning, she seemed altered to nursing home staff.  EMS reported normal vital signs and normal CBG.  History per daughter: Patient has dementia and will have good days and bad days.  She resides in an assisted living facility and typically gets around by use of a wheelchair.  Typically she is able to push herself around in this wheelchair.  Patient's daughter saw her yesterday and the patient seemed to be doing well.     Home Medications Prior to Admission medications   Medication Sig Start Date End Date Taking? Authorizing Provider  acetaminophen (TYLENOL) 500 MG tablet Take 500 mg by mouth every 6 (six) hours as needed for mild pain or moderate pain.   Yes [provider]  amLODipine (NORVASC) 5 MG tablet Take 1 tablet (5 mg total) by mouth daily. 04/07/19  Yes Stacks, Broadus John, MD  cholecalciferol (VITAMIN D3) 25 MCG (1000 UNIT) tablet Take 1,000 Units by mouth daily.   Yes [provider]  ferrous sulfate 325 (65 FE) MG tablet Take 1 tablet (325 mg total) by mouth daily with breakfast.  04/07/19  Yes Mechele Claude, MD  metoprolol tartrate (LOPRESSOR) 25 MG tablet Take 0.5 tablets (12.5 mg total) by mouth 2 (two) times daily. 04/03/22  Yes Erick Blinks, MD  omeprazole (PRILOSEC) 40 MG capsule Take 40 mg by mouth 2 (two) times daily.   Yes [provider]  polyethylene glycol (MIRALAX / GLYCOLAX) 17 g packet Take 17 g by mouth daily.   Yes [provider]  Propylene Glycol (SYSTANE COMPLETE OP) Place 1 drop into both eyes 4 (four) times daily.   Yes [provider]  QUEtiapine (SEROQUEL) 25 MG tablet Take 0.5 tablets (12.5 mg total) by mouth at bedtime. Patient taking differently: Take 12.5 mg by mouth daily at 12 noon. 04/03/22  Yes Erick Blinks, MD  sodium chloride 1 g tablet Take 1 g by mouth 2 (two) times daily.   Yes [provider]  vitamin C (ASCORBIC ACID) 500 MG tablet Take 500 mg by mouth daily.   Yes [provider]      Allergies    Cefdinir, Horse-derived products, Penicillins, and Tetanus antitoxin    Review of Systems   Review of Systems  Unable to perform ROS: Mental status change    Physical Exam Updated Vital Signs BP (!) 152/66 (BP Location: Right Arm)   Pulse 64   Temp 97.7 F (36.5 C) (Axillary)   Resp 14   Ht 5\' 6"  (1.676 m)  Wt 44.2 kg   SpO2 94%   BMI 15.73 kg/m  Physical Exam Vitals and nursing note reviewed.  Constitutional:      General: She is not in acute distress.    Appearance: She is well-developed. She is ill-appearing. She is not toxic-appearing or diaphoretic.  HENT:     Head: Normocephalic and atraumatic.     Right Ear: External ear normal.     Left Ear: External ear normal.     Nose: Nose normal.     Mouth/Throat:     Mouth: Mucous membranes are moist.  Eyes:     Conjunctiva/sclera: Conjunctivae normal.  Cardiovascular:     Rate and Rhythm: Normal rate and regular rhythm.     Heart sounds: No murmur heard. Pulmonary:     Effort: Pulmonary effort is normal. No  respiratory distress.     Breath sounds: Normal breath sounds. No wheezing or rales.  Chest:     Chest wall: No tenderness.  Abdominal:     General: There is no distension.     Palpations: Abdomen is soft.     Tenderness: There is no abdominal tenderness.  Musculoskeletal:        General: No swelling or deformity.     Cervical back: Neck supple. No rigidity.     Right lower leg: No edema.     Left lower leg: No edema.  Skin:    General: Skin is warm and dry.     Coloration: Skin is not jaundiced or pale.  Neurological:     Mental Status: She is alert.     GCS: GCS eye subscore is 1. GCS verbal subscore is 2. GCS motor subscore is 5.     ED Results / Procedures / Treatments   Labs (all labs ordered are listed, but only abnormal results are displayed) Labs Reviewed  COMPREHENSIVE METABOLIC PANEL - Abnormal; Notable for the following components:      Result Value   Sodium 132 (*)    Chloride 97 (*)    Glucose, Bld 109 (*)    AST 13 (*)    GFR, Estimated 52 (*)    All other components within normal limits  CBC WITH DIFFERENTIAL/PLATELET - Abnormal; Notable for the following components:   Hemoglobin 11.9 (*)    HCT 35.7 (*)    All other components within normal limits  URINALYSIS, W/ REFLEX TO CULTURE (INFECTION SUSPECTED) - Abnormal; Notable for the following components:   Color, Urine STRAW (*)    All other components within normal limits  BLOOD GAS, VENOUS - Abnormal; Notable for the following components:   pH, Ven 7.45 (*)    pCO2, Ven 39 (*)    pO2, Ven 51 (*)    Acid-Base Excess 2.8 (*)    All other components within normal limits  I-STAT CHEM 8, ED - Abnormal; Notable for the following components:   Chloride 97 (*)    Glucose, Bld 109 (*)    All other components within normal limits  TROPONIN I (HIGH SENSITIVITY) - Abnormal; Notable for the following components:   Troponin I (High Sensitivity) 18 (*)    All other components within normal limits  TROPONIN I  (HIGH SENSITIVITY) - Abnormal; Notable for the following components:   Troponin I (High Sensitivity) 18 (*)    All other components within normal limits  AMMONIA  MAGNESIUM  LACTIC ACID, PLASMA  CBG MONITORING, ED    EKG None  Radiology CT Head Wo Contrast  Result Date: 12/18/2022 CLINICAL DATA:  Mental status change EXAM: CT HEAD WITHOUT CONTRAST TECHNIQUE: Contiguous axial images were obtained from the base of the skull through the vertex without intravenous contrast. RADIATION DOSE REDUCTION: This exam was performed according to the departmental dose-optimization program which includes automated exposure control, adjustment of the mA and/or kV according to patient size and/or use of iterative reconstruction technique. COMPARISON:  Head CT 12/18/2022 FINDINGS: Brain: Thin left frontal subdural hematoma measuring 3 mm appears unchanged from prior. No new separate areas of hemorrhage. No acute infarct, mass effect or midline shift. No hydrocephalus. Again seen is mild diffuse atrophy and extensive periventricular and deep white matter hypodensity, likely chronic small vessel ischemic change. Vascular: Atherosclerotic calcifications are present within the cavernous internal carotid arteries. Skull: Normal. Negative for fracture or focal lesion. Sinuses/Orbits: No acute finding. Other: None. IMPRESSION: 1. Unchanged thin left frontal subdural hematoma measuring 3 mm. No new separate areas of hemorrhage. No mass effect or midline shift. 2. Stable atrophy and chronic small vessel ischemic change. Electronically Signed   By: Darliss Cheney M.D.   On: 12/18/2022 15:24   CT CHEST ABDOMEN PELVIS WO CONTRAST  Result Date: 12/18/2022 CLINICAL DATA:  Unintended weight loss, altered mental status EXAM: CT CHEST, ABDOMEN AND PELVIS WITHOUT CONTRAST TECHNIQUE: Multidetector CT imaging of the chest, abdomen and pelvis was performed following the standard protocol without IV contrast. RADIATION DOSE REDUCTION:  This exam was performed according to the departmental dose-optimization program which includes automated exposure control, adjustment of the mA and/or kV according to patient size and/or use of iterative reconstruction technique. COMPARISON:  CT pelvis done on 11/07/2022 FINDINGS: CT CHEST FINDINGS Cardiovascular: Coronary artery calcifications are seen. Calcification is seen in mitral annulus. Pacemaker leads are noted in place. There are scattered calcifications in thoracic aorta and its major branches. Small pericardial effusion is present. Mediastinum/Nodes: No significant lymphadenopathy is seen. Lungs/Pleura: Breathing motion limits evaluation. As far as seen, there is no focal pulmonary consolidation or discrete nodules. There is no pleural effusion or pneumothorax. Musculoskeletal: Degenerative changes are noted with encroachment of neural foramina in lower cervical and upper thoracic spine. No acute findings are seen. CT ABDOMEN PELVIS FINDINGS Hepatobiliary: No focal abnormalities are seen in liver. There is no dilation of bile ducts. There is mild wall thickening in the gallbladder. Pancreas: No focal abnormalities are seen. Spleen: Spleen appears smaller than usual in size. Adrenals/Urinary Tract: Adrenals are unremarkable. There is slight prominence of collecting system in the left kidney. Right kidney is smaller than left. There are no renal or ureteral stones. Urinary bladder is unremarkable. Stomach/Bowel: Large hiatal hernia is seen. There is no significant small bowel dilation. The appendix is not seen. There is no wall thickening in colon. Multiple diverticula seen in colon without evidence of focal acute diverticulitis. Vascular/Lymphatic: Extensive arterial calcifications are seen. Reproductive: There are small calcified uterine fibroids. Other: There is no ascites or pneumoperitoneum. Musculoskeletal: There is moderate anterolisthesis at L5-S1 level. Spondylolysis is seen in the L5 vertebra.  There is 60% decrease in height of body of L5 vertebra with no interval change. IMPRESSION: No acute findings are seen a noncontrast CT chest, abdomen and pelvis. There are no infiltrates or nodules in the lung fields. Aortic arteriosclerosis. Coronary artery disease. There is no evidence of intestinal obstruction or pneumoperitoneum. There is slight prominence of the pelvocaliceal system in the left kidney which may be a normal variation or suggest residual change from previous obstruction. There are no demonstrable  renal or ureteral stones. Large fixed hiatal hernia. Aortic arteriosclerosis. Diverticulosis of colon without signs of focal diverticulitis. Old compression fracture is seen in the body of L5 vertebra. Spondylolysis is seen in the L5 vertebra with spondylolisthesis at the L5-S1 level. These findings appear stable. Degenerative changes are noted with encroachment of neural foramina in lower cervical and upper thoracic spine. Electronically Signed   By: Ernie Avena M.D.   On: 12/18/2022 10:16   CT HEAD WO CONTRAST  Result Date: 12/18/2022 CLINICAL DATA:  Provided history: Mental status change, unknown cause. Altered mental status. EXAM: CT HEAD WITHOUT CONTRAST TECHNIQUE: Contiguous axial images were obtained from the base of the skull through the vertex without intravenous contrast. RADIATION DOSE REDUCTION: This exam was performed according to the departmental dose-optimization program which includes automated exposure control, adjustment of the mA and/or kV according to patient size and/or use of iterative reconstruction technique. COMPARISON:  Head CT 11/07/2022. FINDINGS: Mildly motion degraded exam. Brain: Generalized cerebral atrophy. Apparent hyperdensity overlying portions of the lateral left frontal lobe, which may reflect a thin subdural hematoma (3 mm in thickness) versus artifact from motion (for instance as seen on series 3, image 18) (series 5, image 37). Patchy and ill-defined  hypoattenuation within the cerebral white matter, nonspecific but compatible with advanced chronic small vessel ischemic disease. No demarcated cortical infarct. No evidence of an intracranial mass. No midline shift. Vascular: No hyperdense vessel.  Atherosclerotic calcifications. Skull: No fracture or aggressive osseous lesion. Sinuses/Orbits: No mass or acute finding within the imaged orbits. Minimal mucosal thickening within the bilateral maxillary sinuses. Small-volume frothy secretions within the right sphenoid sinus. Trace mucosal thickening within bilateral ethmoid air cells. Impression #2 called by telephone at the time of interpretation on 12/18/2022 at 8:40 am to provider [facility] , who verbally acknowledged these results. IMPRESSION: 1. Mildly motion degraded exam. 2. Possible thin acute subdural hematoma overlying the lateral left frontal lobe (versus artifact from motion). A short-interval follow-up non-contrast head CT is recommended. 3. Parenchymal atrophy and chronic small vessel ischemic disease. 4. Mild paranasal sinus disease as described. Electronically Signed   By: Jackey Loge D.O.   On: 12/18/2022 08:43   DG Chest Portable 1 View  Result Date: 12/18/2022 CLINICAL DATA:  One 89 year old female with altered mental status, last known well 0300 hours. EXAM: PORTABLE CHEST 1 VIEW COMPARISON:  Total chest 03/28/2022 and earlier. FINDINGS: Portable AP semi upright view at 0752 hours. Mildly rotated to the right. Stable cardiomegaly and mediastinal contours. Stable left chest cardiac pacemaker. Large lung volumes. Visualized tracheal air column is within normal limits. Allowing for portable technique the lungs are clear. No pneumothorax or pleural effusion identified. Paucity of bowel gas in the visible abdomen. No acute osseous abnormality identified. IMPRESSION: Stable cardiomegaly. Pulmonary hyperinflation. No acute cardiopulmonary abnormality. Electronically Signed   By: Odessa Fleming M.D.    On: 12/18/2022 08:00    Procedures Procedures    Medications Ordered in ED Medications - No data to display  ED Course/ Medical Decision Making/ A&P                             Medical Decision Making Amount and/or Complexity of Data Reviewed Labs: ordered. Radiology: ordered.  Risk Decision regarding hospitalization.   This patient presents to the ED for concern of altered mental status, this involves an extensive number of treatment options, and is a complaint that carries with  it a high risk of complications and morbidity.  The differential diagnosis includes CVA, seizure, infection, metabolic derangements, progression of dementia   Co morbidities that complicate the patient evaluation  Dementia, HTN, GERD, sinoatrial node dysfunction s/p pacemaker, CVA with residual left-sided hemiparesis, atrial fibrillation, HLD   Additional history obtained:  Additional history obtained from EMS, patient's daughter External records from outside source obtained and reviewed including EMR   Lab Tests:  I Ordered, and personally interpreted labs.  The pertinent results include: Mild hyponatremia and hypochloremia with otherwise normal electrolytes, baseline anemia, no leukocytosis, no evidence of UTI, normal troponin, normal lactate   Imaging Studies ordered:  I ordered imaging studies including CT of head, chest, abdomen, pelvis I independently visualized and interpreted imaging which showed possible small acute SDH without any other acute findings I agree with the radiologist interpretation   Cardiac Monitoring: / EKG:  The patient was maintained on a cardiac monitor.  I personally viewed and interpreted the cardiac monitored which showed an underlying rhythm of: Sinus rhythm   Problem List / ED Course / Critical interventions / Medication management  Patient presents for altered mental status.  Per EMS, this was noted by facility staff at 3 AM.  Per daughter, she seemed  to be in her normal state of health yesterday.  On arrival, patient is found to be moaning.  She is moving all extremities.  Per chart review, she has prior CVA with residual left-sided deficits.  Today, she does appear to be slightly weaker on the right side.  Broad diagnostic workup was initiated.  After patient's arrival, she was joined bedside by her daughter.  Daughter is her primary Management consultant.  Patient is confirmed DNR/DNI.  Patient's vital signs remained normal.  She had no change in her mental status.  Initial CT scan of head was notable for a possible small, thin SDH.  Recommendations were for repeat CT scan.  Given the questionable findings given motion artifact, no consult was placed for neurosurgery.  Additionally, patient's daughter states that patient would not want invasive procedures.  She is not currently on a blood thinner.  6-hour repeat CT scan was ordered.  Remaining workup was unremarkable.  Given her continued encephalopathy, patient was admitted to medicine for further management.   Social Determinants of Health:  Per daughter, has dementia at baseline.  Resides in assisted living facility.         Final Clinical Impression(s) / ED Diagnoses Final diagnoses:  Acute encephalopathy    Rx / DC Orders ED Discharge Orders     None         Gloris Manchester, MD 12/18/22 1759

## 2022-12-18 NOTE — Progress Notes (Signed)
EEG complete - results pending 

## 2022-12-18 NOTE — Progress Notes (Signed)
ABG drawn and taken to lab, lab called blood clotted, message sent to MD cause it took 5 people to hold patient for RT to draw blood and patient screaming and fighting, No answer/response from MD

## 2022-12-18 NOTE — Hospital Course (Signed)
89 year old female with a history of paroxysmal atrial fibrillation, hypertension, stroke with left hemiparesis, hyperlipidemia, dementia, GERD presenting with altered mental status from [facility].  Interestingly, it was noted that the staff at [facility] noted the patient to be at her usual baseline at 3 AM on the morning of 12/18/2022.  However, it is unclear exactly what they were doing with the patient at 3 AM. Nevertheless, the patient's daughter at the bedside supplements the history.  At baseline, the patient is essentially wheelchair-bound needing assistance with transfers, feeding, and dressing.  The patient is able to speak otherwise pleasantly confused.  Daughter states that the patient was at her usual baseline in the afternoon of 12/17/2022.  Daughter was contacted by [facility] the morning of 12/18/22 telling her that they were sending the patient to the hospital for altered mental status.  Notably, the patient was sent to the emergency department at [facility] on 12/15/22 for confusion and combativeness.  Review the medical record shows that the patient was sent there for a psych evaluation and to obtain a urinalysis as requested by her assisted living facility.  Routine blood work showed a sodium 130, serum creatinine 0.7, potassium 3.5, bicarbonate 26.  LFTs were unremarkable.  Urinalysis was negative for significant pyuria.  CBC showed WBC 5.9, hemoglobin 10.4, platelets 224,000.  Imaging of the brain was not obtained.  The patient was discharged back to [facility] in stable condition. Daughter states that she saw the patient on 12/16/2022 after she returned from the ED, and felt that the patient was near her usual baseline.  Daughter states that there has been no new complaints.  Specifically, no fevers, chills, chest pain, shortness breath, abdominal pain, vomiting, diarrhea.  There is been no recent falls in the past month.  There is not been any new medications.  In the ED at [facility], the  patient was afebrile and hemodynamically stable with oxygen saturation 97% on room air.  WBC 5.4, hemoglobin 9.9, platelets 225,000.  Sodium 132, potassium 3.5, bicarbonate 26, serum creatinine 0.98.  CT of the brain had motion artifact with possible thin SDH overlying the left lateral frontal lobe versus possible artifact.  Chest x-ray showed hyperinflation with questionable RLL opacity.  CT of the chest was negative for consolidation.  CT of the abdomen and pelvis was negative for any colon wall thickening or obstruction.  There is no acute findings in the CT of the abdomen.  VBG showed 7.4 5/39/51/27.  Ammonia was less than 10.  Troponin 18>> 18.  Patient was admitted for further evaluation and treatment of her altered mental status. -------------------------------------------------------------------------------------------------------------    Assessment & Plan:   Principal Problem:   Acute metabolic encephalopathy Active Problems:   Essential hypertension   Sinoatrial node dysfunction (HCC)   Paroxysmal atrial fibrillation (HCC)   Dysphagia   Acute encephalopathy    Acute metabolic encephalopathy/Subdural hematoma Progressively worsening metabolic encephalopathy -Daughter present at bedside after extensive discussion confirming DNR/DNI status, pursuing comfort care    -Etiology--suspect may be due to SDH -ABG--7.49/37/83/28 on RA -12/18/2022 CT brain--possible thin SDH overlying left lateral frontal lobe versus artifact -6/17 repeat CT brain--Unchanged thin left frontal subdural hematoma measuring 3 mm. No new separate areas of hemorrhage. No mass effect or midline shift. -EEG--no seizure -B12--1059 -TSH--0.740 -Ammonia <10 -UA negative for pyuria   Paroxysmal atrial fibrillation -Currently not on anticoagulation secondary to GI bleed and advanced age -IV lopressor in lieu of po metoprolol -Pacemaker -Rate controlled   Essential hypertension -Continue  amlodipine/metoprolol   Dementia without behavioral disturbance -Discontinued trazodone and Seroquel home doses   Hyponatremia Status post IV fluid normal saline, discontinue   GERD Was on pantoprazole   History of stroke with left hemiparesis -PT evaluation if mental status improves   Dysphagia -Patient is currently on a dysphagia 1 diet -NPO now on the comfort care   Sinus node dysfunction -s/p PPM   Goals of Care -discussed with daughter--confirmes DNR -Discussed extensively with daughter at bedside, agreed to pursuing with comfort care due to steep decline  -Pending hospice evaluation in a.m.           Family Communication:   daughter at bedside 6/19   Consultants:  palliative   Code Status: DNR -comfort care   DVT Prophylaxis:  SCD

## 2022-12-19 ENCOUNTER — Observation Stay (HOSPITAL_COMMUNITY): Payer: Medicare Other

## 2022-12-19 DIAGNOSIS — I69354 Hemiplegia and hemiparesis following cerebral infarction affecting left non-dominant side: Secondary | ICD-10-CM | POA: Diagnosis not present

## 2022-12-19 DIAGNOSIS — S065XAA Traumatic subdural hemorrhage with loss of consciousness status unknown, initial encounter: Secondary | ICD-10-CM | POA: Diagnosis not present

## 2022-12-19 DIAGNOSIS — G9341 Metabolic encephalopathy: Secondary | ICD-10-CM | POA: Diagnosis present

## 2022-12-19 DIAGNOSIS — Z887 Allergy status to serum and vaccine status: Secondary | ICD-10-CM | POA: Diagnosis not present

## 2022-12-19 DIAGNOSIS — I10 Essential (primary) hypertension: Secondary | ICD-10-CM | POA: Diagnosis present

## 2022-12-19 DIAGNOSIS — Z79899 Other long term (current) drug therapy: Secondary | ICD-10-CM | POA: Diagnosis not present

## 2022-12-19 DIAGNOSIS — Z7189 Other specified counseling: Secondary | ICD-10-CM | POA: Diagnosis not present

## 2022-12-19 DIAGNOSIS — Z880 Allergy status to penicillin: Secondary | ICD-10-CM | POA: Diagnosis not present

## 2022-12-19 DIAGNOSIS — F03C11 Unspecified dementia, severe, with agitation: Secondary | ICD-10-CM | POA: Diagnosis not present

## 2022-12-19 DIAGNOSIS — R569 Unspecified convulsions: Secondary | ICD-10-CM | POA: Diagnosis not present

## 2022-12-19 DIAGNOSIS — R4182 Altered mental status, unspecified: Secondary | ICD-10-CM | POA: Diagnosis present

## 2022-12-19 DIAGNOSIS — Z681 Body mass index (BMI) 19 or less, adult: Secondary | ICD-10-CM | POA: Diagnosis not present

## 2022-12-19 DIAGNOSIS — Z993 Dependence on wheelchair: Secondary | ICD-10-CM | POA: Diagnosis not present

## 2022-12-19 DIAGNOSIS — I495 Sick sinus syndrome: Secondary | ICD-10-CM | POA: Diagnosis present

## 2022-12-19 DIAGNOSIS — I6200 Nontraumatic subdural hemorrhage, unspecified: Secondary | ICD-10-CM | POA: Diagnosis present

## 2022-12-19 DIAGNOSIS — Z881 Allergy status to other antibiotic agents status: Secondary | ICD-10-CM | POA: Diagnosis not present

## 2022-12-19 DIAGNOSIS — G9340 Encephalopathy, unspecified: Secondary | ICD-10-CM | POA: Diagnosis not present

## 2022-12-19 DIAGNOSIS — R627 Adult failure to thrive: Secondary | ICD-10-CM | POA: Diagnosis not present

## 2022-12-19 DIAGNOSIS — I480 Paroxysmal atrial fibrillation: Secondary | ICD-10-CM | POA: Diagnosis present

## 2022-12-19 DIAGNOSIS — Z950 Presence of cardiac pacemaker: Secondary | ICD-10-CM | POA: Diagnosis not present

## 2022-12-19 DIAGNOSIS — K219 Gastro-esophageal reflux disease without esophagitis: Secondary | ICD-10-CM | POA: Diagnosis present

## 2022-12-19 DIAGNOSIS — E871 Hypo-osmolality and hyponatremia: Secondary | ICD-10-CM | POA: Diagnosis present

## 2022-12-19 DIAGNOSIS — F03C0 Unspecified dementia, severe, without behavioral disturbance, psychotic disturbance, mood disturbance, and anxiety: Secondary | ICD-10-CM | POA: Diagnosis present

## 2022-12-19 DIAGNOSIS — R1310 Dysphagia, unspecified: Secondary | ICD-10-CM | POA: Diagnosis present

## 2022-12-19 DIAGNOSIS — Z8249 Family history of ischemic heart disease and other diseases of the circulatory system: Secondary | ICD-10-CM | POA: Diagnosis not present

## 2022-12-19 DIAGNOSIS — Z833 Family history of diabetes mellitus: Secondary | ICD-10-CM | POA: Diagnosis not present

## 2022-12-19 DIAGNOSIS — Z91048 Other nonmedicinal substance allergy status: Secondary | ICD-10-CM | POA: Diagnosis not present

## 2022-12-19 DIAGNOSIS — R64 Cachexia: Secondary | ICD-10-CM | POA: Diagnosis present

## 2022-12-19 DIAGNOSIS — Z515 Encounter for palliative care: Secondary | ICD-10-CM | POA: Diagnosis not present

## 2022-12-19 DIAGNOSIS — I6201 Nontraumatic acute subdural hemorrhage: Secondary | ICD-10-CM | POA: Diagnosis present

## 2022-12-19 DIAGNOSIS — Z888 Allergy status to other drugs, medicaments and biological substances status: Secondary | ICD-10-CM | POA: Diagnosis not present

## 2022-12-19 DIAGNOSIS — F0390 Unspecified dementia without behavioral disturbance: Secondary | ICD-10-CM | POA: Diagnosis present

## 2022-12-19 DIAGNOSIS — E7800 Pure hypercholesterolemia, unspecified: Secondary | ICD-10-CM | POA: Diagnosis present

## 2022-12-19 DIAGNOSIS — Z87891 Personal history of nicotine dependence: Secondary | ICD-10-CM | POA: Diagnosis not present

## 2022-12-19 DIAGNOSIS — E878 Other disorders of electrolyte and fluid balance, not elsewhere classified: Secondary | ICD-10-CM | POA: Diagnosis present

## 2022-12-19 DIAGNOSIS — Z8719 Personal history of other diseases of the digestive system: Secondary | ICD-10-CM | POA: Diagnosis not present

## 2022-12-19 DIAGNOSIS — Z66 Do not resuscitate: Secondary | ICD-10-CM | POA: Diagnosis present

## 2022-12-19 LAB — COMPREHENSIVE METABOLIC PANEL
ALT: 12 U/L (ref 0–44)
AST: 19 U/L (ref 15–41)
Albumin: 3.5 g/dL (ref 3.5–5.0)
Alkaline Phosphatase: 71 U/L (ref 38–126)
Anion gap: 8 (ref 5–15)
BUN: 14 mg/dL (ref 8–23)
CO2: 27 mmol/L (ref 22–32)
Calcium: 9.2 mg/dL (ref 8.9–10.3)
Chloride: 96 mmol/L — ABNORMAL LOW (ref 98–111)
Creatinine, Ser: 0.74 mg/dL (ref 0.44–1.00)
GFR, Estimated: >60 mL/min (ref >60)
Glucose, Bld: 110 mg/dL — ABNORMAL HIGH (ref 70–99)
Potassium: 3.4 mmol/L — ABNORMAL LOW (ref 3.5–5.1)
Sodium: 131 mmol/L — ABNORMAL LOW (ref 135–145)
Total Bilirubin: 0.8 mg/dL (ref 0.3–1.2)
Total Protein: 7 g/dL (ref 6.5–8.1)

## 2022-12-19 LAB — BLOOD GAS, ARTERIAL
Acid-Base Excess: 4.6 mmol/L — ABNORMAL HIGH (ref 0.0–2.0)
Bicarbonate: 28.3 mmol/L — ABNORMAL HIGH (ref 20.0–28.0)
Drawn by: 21310
O2 Saturation: 98.4 %
Patient temperature: 36.5
pCO2 arterial: 37 mmHg (ref 32–48)
pH, Arterial: 7.49 — ABNORMAL HIGH (ref 7.35–7.45)
pO2, Arterial: 83 mmHg (ref 83–108)

## 2022-12-19 LAB — VITAMIN B12: Vitamin B-12: 1059 pg/mL — ABNORMAL HIGH (ref 180–914)

## 2022-12-19 LAB — FOLATE: Folate: 9.7 ng/mL (ref >5.9)

## 2022-12-19 LAB — T4, FREE: Free T4: 0.97 ng/dL (ref 0.61–1.12)

## 2022-12-19 LAB — TSH: TSH: 0.74 u[IU]/mL (ref 0.350–4.500)

## 2022-12-19 MED ORDER — POTASSIUM CHLORIDE IN NACL 20-0.9 MEQ/L-% IV SOLN: INTRAVENOUS | Status: DC

## 2022-12-19 MED ORDER — METOPROLOL TARTRATE 5 MG/5ML IV SOLN
2.5000 mg | Freq: Four times a day (QID) | INTRAVENOUS | Status: DC
  Administered 2022-12-19 – 2022-12-22 (×10): 2.5 mg via INTRAVENOUS
  Filled 2022-12-19 (×10): qty 5

## 2022-12-19 MED ORDER — ONDANSETRON HCL 4 MG/2ML IJ SOLN: 4.0000 mg | Freq: Four times a day (QID) | INTRAMUSCULAR | Status: DC | PRN

## 2022-12-19 MED ORDER — POTASSIUM CHLORIDE 10 MEQ/100ML IV SOLN
10.0000 meq | INTRAVENOUS | Status: AC
  Administered 2022-12-19 (×2): 10 meq via INTRAVENOUS
  Filled 2022-12-19 (×2): qty 100

## 2022-12-19 MED ORDER — ACETAMINOPHEN 325 MG PO TABS: 650.0000 mg | ORAL_TABLET | Freq: Four times a day (QID) | ORAL | Status: DC | PRN

## 2022-12-19 MED ORDER — SODIUM CHLORIDE 0.9 % IV SOLN: INTRAVENOUS | Status: DC

## 2022-12-19 NOTE — Progress Notes (Signed)
Pt did not have orders or code status in place when I received this pt. MD notified. Respirations have been 8-10 during the night. BP elevated, HR around 60. Pt responsive to pain only. Pt vomited a small amount, bright green. MD notified and pt was assessed by him. NS @ 50 started per order.

## 2022-12-19 NOTE — Progress Notes (Signed)
   12/19/22 0311  Assess: MEWS Score  Temp 97.7 F (36.5 C)  BP (!) 168/75  MAP (mmHg) 102  Pulse Rate (!) 57  ECG Heart Rate 83  Resp 12  SpO2 97 %  O2 Device Room Air  Assess: MEWS Score  MEWS Temp 0  MEWS Systolic 0  MEWS Pulse 0  MEWS RR 1  MEWS LOC 2  MEWS Score 3  MEWS Score Color Yellow  Assess: if the MEWS score is Yellow or Red  Were vital signs taken at a resting state? Yes  Focused Assessment No change from prior assessment  Does the patient meet 2 or more of the SIRS criteria? No  MEWS guidelines implemented  Yes, yellow  Treat  MEWS Interventions Considered administering scheduled or prn medications/treatments as ordered  Take Vital Signs  Increase Vital Sign Frequency  Yellow: Q2hr x1, continue Q4hrs until patient remains green for 12hrs  Escalate  MEWS: Escalate Yellow: Discuss with charge nurse and consider notifying provider and/or RRT  Notify: Charge Nurse/RN  Name of Charge Nurse/RN Notified Revonda Standard, RN  Provider Notification  Provider Name/Title Thomes Dinning, DO  Date Provider Notified 12/19/22  Assess: SIRS CRITERIA  SIRS Temperature  0  SIRS Pulse 0  SIRS Respirations  0  SIRS WBC 0  SIRS Score Sum  0

## 2022-12-19 NOTE — TOC Initial Note (Signed)
Transition of Care [facility]) - Initial/Assessment Note    Patient Details  Name: Kelsey Thompson MRN: [id number] Date of Birth: [birth year]  Transition of [facility]) CM/SW Contact:    Elliot Gault, LCSW Phone Number: 12/19/2022, 3:51 PM  Clinical Narrative:                  Pt admitted from [facility] with decreased level of consciousness. MD states that plan is to re-assess patient tomorrow, obtain palliative consult, and speak with daughter about options and goals of care depending on pt's status.   TOC will follow up once needs become known.   Expected Discharge Plan: Assisted Living Barriers to Discharge: Continued Medical Work up   Patient Goals and CMS Choice            Expected Discharge Plan and Services In-house Referral: Clinical Social Work     Living arrangements for the past 2 months: Assisted Living Facility                                      Prior Living Arrangements/Services Living arrangements for the past 2 months: Assisted Living Facility Lives with:: Facility Resident   Do you feel safe going back to the place where you live?: Yes      Need for Family Participation in Patient Care: No (Comment) Care giver support system in place?: Yes (comment)   Criminal Activity/Legal Involvement Pertinent to Current Situation/Hospitalization: Yes - Comment as needed  Activities of Daily Living Home Assistive Devices/Equipment: Wheelchair ADL Screening (condition at time of admission) Patient's cognitive ability adequate to safely complete daily activities?: No Is the patient deaf or have difficulty hearing?: No Does the patient have difficulty seeing, even when wearing glasses/contacts?: No Does the patient have difficulty concentrating, remembering, or making decisions?: Yes Patient able to express need for assistance with ADLs?: No Does the patient have difficulty dressing or bathing?: Yes Independently performs ADLs?: No Communication:  Independent Dressing (OT): Dependent Is this a change from baseline?: Pre-admission baseline Grooming: Needs assistance Is this a change from baseline?: Pre-admission baseline Feeding: Dependent Is this a change from baseline?: Change from baseline, expected to last <3 days Bathing: Dependent Is this a change from baseline?: Pre-admission baseline Toileting: Dependent Is this a change from baseline?: Pre-admission baseline In/Out Bed: Dependent Is this a change from baseline?: Pre-admission baseline Walks in Home: Dependent Is this a change from baseline?: Pre-admission baseline Does the patient have difficulty walking or climbing stairs?: Yes Weakness of Legs: Both Weakness of Arms/Hands: Both  Permission Sought/Granted                  Emotional Assessment         Alcohol / Substance Use: Not Applicable Psych Involvement: No (comment)  Admission diagnosis:  Acute metabolic encephalopathy [G93.41] Patient Active Problem List   Diagnosis Date Noted   Acute encephalopathy 12/19/2022   Acute metabolic encephalopathy 12/18/2022   Dysphagia    Acute hypoxemic respiratory failure (HCC) 03/27/2022   Hematemesis 03/26/2022   GI bleed 03/25/2022   Altered behavior 10/20/2020   Hemiparesis affecting left side as late effect of cerebrovascular accident (CVA) (HCC) 05/19/2019   Paroxysmal atrial fibrillation (HCC) 01/21/2019   Hyperlipidemia LDL goal <70 01/21/2019   Embolic stroke (HCC) R brain s/p tPA d/t AF 01/16/2019   Acute pain of left knee 07/17/2014   Sinoatrial node dysfunction (HCC)  09/26/2011   Cardiac pacemaker in situ 11/19/2008   Essential hypertension 11/07/2008   BRADYCARDIA 11/07/2008   GERD 11/07/2008   Diaphragmatic hernia 11/07/2008   PCP:  System, Provider Not In Pharmacy:   [id number] - [place], [place] - [address] [place] [address] Phone: [phone] Fax: [phone]     Social Determinants of Health  (SDOH) Social History: SDOH Screenings   Food Insecurity: No Food Insecurity (12/18/2022)  Housing: Low Risk  (12/18/2022)  Transportation Needs: No Transportation Needs (12/18/2022)  Utilities: Not At Risk (12/18/2022)  Alcohol Screen: Low Risk  (04/26/2022)  Depression (PHQ2-9): Low Risk  (04/26/2022)  Financial Resource Strain: Low Risk  (04/26/2022)  Physical Activity: Inactive (04/26/2022)  Social Connections: Moderately Integrated (04/26/2022)  Stress: No Stress Concern Present (04/26/2022)  Tobacco Use: Medium Risk (12/18/2022)   SDOH Interventions:     Readmission Risk Interventions    03/30/2022   11:07 AM  Readmission Risk Prevention Plan  Post Dischage Appt Not Complete  Medication Screening Complete  Transportation Screening Complete

## 2022-12-19 NOTE — Procedures (Signed)
Patient Name: Kelsey Thompson  MRN: [id number]  Epilepsy Attending: Charlsie Quest  Referring Physician/Provider: Catarina Hartshorn, MD  Date: 12/18/2022 Duration: 22.46 mins  Patient history: 89yo F with ams getting eeg to evaluate for seizure.   Level of alertness: Awake  AEDs during EEG study: None  Technical aspects: This EEG study was done with scalp electrodes positioned according to the 10-20 International system of electrode placement. Electrical activity was reviewed with band pass filter of 1-70Hz , sensitivity of 7 uV/mm, display speed of 3mm/sec with a 60Hz  notched filter applied as appropriate. EEG data were recorded continuously and digitally stored.  Video monitoring was available and reviewed as appropriate.  Description: EEG showed continuous generalized predominantly 5 to 6 Hz theta slowing admixed with intermittent 2-3Hz  delta slowing. Hyperventilation and photic stimulation were not performed.     ABNORMALITY - Continuous slow, generalized  IMPRESSION: This study is suggestive of moderate diffuse encephalopathy, nonspecific etiology. No seizures or epileptiform discharges were seen throughout the recording.  [name]

## 2022-12-19 NOTE — Consult Note (Signed)
Consultation Note Date: 12/19/2022   Patient Name: Kelsey Thompson  DOB: 1920/07/23  MRN: [id number]  Age / Sex: 89 y.o., female  PCP: System, Provider Not In Referring Physician: Catarina Hartshorn, MD  Reason for Consultation: "GOC discussion. Transition to residential hospice"  HPI/Patient Profile: 89 y.o. female  with past medical history of dementia, HTN, GERD, CVA with L side hemiparesis, a fib, HLD, lives at [facility] admitted on 12/18/2022 with altered mental status. Etiology of AMS is unclear. Hyponatremic at 132 on admit- 131 today. Urine negative. CBC WNL. CT of head with very small acute SDH overlying the L cerebral hemisphere that was unchanged in size on repeat but distribution changed to overlying L parietal lobe, parenchymal atrophy and chronic small vessel ischemic disease. EEG resulted in diffuse encephalopathy- nonspecific etiology.   Primary Decision Maker NEXT OF KIN- daughter Ricard Dillon  Discussion: Chart reviewed including labs, progress notes, imaging from this and previous encounters.  Per chart review and discussion with attending MD and RN- prior to admission patient living at ALF. Required assistance with ADL's. Wheelchair bound. Able to heat with hand feeding and chopped diet. Minimally conversive.  When she was admitted she was found to be responsive only to pain.  Today on my evaluation she was awake, looked frightened. Following some commands. She mostly repeated my words over and over, did not answer any questions.  Per MD- plan is for continued gentle interventions and monitor for improvement vs decompensation. If she decompensates then transition to inpatient hospice.    SUMMARY OF RECOMMENDATIONS -Continue current plan of care -I left message for patient's daughter Gae Gallop for further discussion -Patient with end stage dementia- if she declines further consider inpatient  hospice- if stabilizes she is likely eligible for hospice services at her facility depending on goals of care    Code Status/Advance Care Planning: DNR   Prognosis:   Unable to determine  Discharge Planning: To Be Determined  Primary Diagnoses: Present on Admission:  Acute metabolic encephalopathy  Sinoatrial node dysfunction (HCC)  Paroxysmal atrial fibrillation (HCC)  Essential hypertension   Review of Systems  Unable to perform ROS   Physical Exam Vitals and nursing note reviewed.  Constitutional:      Comments: Frail, cachetic  Pulmonary:     Effort: Pulmonary effort is normal.  Neurological:     Mental Status: She is disoriented.     Vital Signs: BP 115/67 (BP Location: Right Arm)   Pulse 68   Temp 98.6 F (37 C)   Resp 12   Ht 5\' 6"  (1.676 m)   Wt 44.2 kg   SpO2 93%   BMI 15.73 kg/m  Pain Scale: PAINAD       SpO2: SpO2: 93 % O2 Device:SpO2: 93 % O2 Flow Rate: .   IO: Intake/output summary:  Intake/Output Summary (Last 24 hours) at 12/19/2022 1534 Last data filed at 12/18/2022 1752 Gross per 24 hour  Intake 0 ml  Output --  Net 0 ml    LBM:  Baseline Weight: Weight: 44.2 kg Most recent weight: Weight: 44.2 kg       Thank you for this consult. Palliative medicine will continue to follow and assist as needed.   Greater than 50%  of this time was spent counseling and coordinating care related to the above assessment and plan.  Signed by: Ocie Bob, AGNP-C Palliative Medicine    Please contact Palliative Medicine Team phone at [phone] for questions and concerns.  For individual provider: See Loretha Stapler

## 2022-12-19 NOTE — Progress Notes (Signed)
PROGRESS NOTE  Kelsey Thompson ZOX:[id number] DOB: [birth year] DOA: 12/18/2022 PCP: System, Provider Not In  Brief History:  89 year old female with a history of paroxysmal atrial fibrillation, hypertension, stroke with left hemiparesis, hyperlipidemia, dementia, GERD presenting with altered mental status from [facility].  Interestingly, it was noted that the staff at [facility] noted the patient to be at her usual baseline at 3 AM on the morning of 12/18/2022.  However, it is unclear exactly what they were doing with the patient at 3 AM. Nevertheless, the patient's daughter at the bedside supplements the history.  At baseline, the patient is essentially wheelchair-bound needing assistance with transfers, feeding, and dressing.  The patient is able to speak otherwise pleasantly confused.  Daughter states that the patient was at her usual baseline in the afternoon of 12/17/2022.  Daughter was contacted by [facility] the morning of 12/18/22 telling her that they were sending the patient to the hospital for altered mental status.  Notably, the patient was sent to the emergency department at [facility] on 12/15/22 for confusion and combativeness.  Review the medical record shows that the patient was sent there for a psych evaluation and to obtain a urinalysis as requested by her assisted living facility.  Routine blood work showed a sodium 130, serum creatinine 0.7, potassium 3.5, bicarbonate 26.  LFTs were unremarkable.  Urinalysis was negative for significant pyuria.  CBC showed WBC 5.9, hemoglobin 10.4, platelets 224,000.  Imaging of the brain was not obtained.  The patient was discharged back to [facility] in stable condition. Daughter states that she saw the patient on 12/16/2022 after she returned from the ED, and felt that the patient was near her usual baseline.  Daughter states that there has been no new complaints.  Specifically, no fevers, chills, chest pain, shortness breath, abdominal pain,  vomiting, diarrhea.  There is been no recent falls in the past month.  There is not been any new medications.  In the ED at [facility], the patient was afebrile and hemodynamically stable with oxygen saturation 97% on room air.  WBC 5.4, hemoglobin 9.9, platelets 225,000.  Sodium 132, potassium 3.5, bicarbonate 26, serum creatinine 0.98.  CT of the brain had motion artifact with possible thin SDH overlying the left lateral frontal lobe versus possible artifact.  Chest x-ray showed hyperinflation with questionable RLL opacity.  CT of the chest was negative for consolidation.  CT of the abdomen and pelvis was negative for any colon wall thickening or obstruction.  There is no acute findings in the CT of the abdomen.  VBG showed 7.4 5/39/51/27.  Ammonia was less than 10.  Troponin 18>> 18.  Patient was admitted for further evaluation and treatment of her altered mental status.   Assessment/Plan:  Acute metabolic encephalopathy/Subdural hematoma -Etiology--suspect may be due to SDH -ABG--7.49/37/83/28 on RA -12/18/2022 CT brain--possible thin SDH overlying left lateral frontal lobe versus artifact -6/17 repeat CT brain--Unchanged thin left frontal subdural hematoma measuring 3 mm. No new separate areas of hemorrhage. No mass effect or midline shift. -EEG--no seizure -B12--1059 -TSH--0.740 -Ammonia <10 -UA negative for pyuria -6/18--mental status shows some improvement--able to states name and occasionally answer yes/no   Paroxysmal atrial fibrillation -Currently not on anticoagulation secondary to GI bleed and advanced age -IV lopressor in lieu of po metoprolol -Rate controlled   Essential hypertension -Continue amlodipine and metoprolol if able to tolerate p.o.   Dementia without behavioral disturbance -Continue trazodone and Seroquel home doses  Hyponatremia -Continue sodium chloride -start NS -repeat BMP   GERD -Continue pantoprazole   History of stroke with left hemiparesis -PT  evaluation if mental status improves   Dysphagia -Patient is currently on a dysphagia 1 diet -Speech therapy evaluation if the patient's mental status improves   Sinus node dysfunction -s/p PPM  Goals of Care -discussed with daughter--confirmes DNR -daughter does not want any invasive, aggressive measures -pt showing some improvement, but if not near baseline in next 24 hours, plan for possible residential hospice       Family Communication:   daughter at bedside 6/18  Consultants:  palliative  Code Status: DNR  DVT Prophylaxis:  SCD   Procedures: As Listed in Progress Note Above  Antibiotics: None       Subjective: Patient is awake and alert.  States her name.  Intermittently answers yes/no  Objective: Vitals:   12/18/22 1940 12/18/22 2207 12/19/22 0311 12/19/22 0838  BP: (!) 170/89 (!) 150/78 (!) 168/75 129/63  Pulse: 63 60 (!) 57 (!) 59  Resp: 16 (!) 9 12   Temp: (!) 97.5 F (36.4 C)  97.7 F (36.5 C) 98 F (36.7 C)  TempSrc:   Oral   SpO2: 99% 98% 97% 93%  Weight:      Height:        Intake/Output Summary (Last 24 hours) at 12/19/2022 1320 Last data filed at 12/18/2022 1752 Gross per 24 hour  Intake 0 ml  Output --  Net 0 ml   Weight change:  Exam:  General:  Pt is alert, follows commands intermittently, not in acute distress HEENT: No icterus, No thrush, No neck mass, [place]/AT Cardiovascular: RRR, S1/S2, no rubs, no gallops Respiratory: bibasilar crackles.  No wheeze Abdomen: Soft/+BS, non tender, non distended, no guarding Extremities: No edema, No lymphangitis, No petechiae, No rashes, no synovitis   Data Reviewed: I have personally reviewed following labs and imaging studies Basic Metabolic Panel: Recent Labs  Lab 12/18/22 0745 12/18/22 0755  NA 132* 135  K 3.5 3.7  CL 97* 97*  CO2 26  --   GLUCOSE 109* 109*  BUN 15 14  CREATININE 0.98 1.00  CALCIUM 9.0  --   MG 2.0  --    Liver Function Tests: Recent Labs  Lab  12/18/22 0745  AST 13*  ALT 10  ALKPHOS 75  BILITOT 0.5  PROT 7.0  ALBUMIN 3.6   No results for input(s): "LIPASE", "AMYLASE" in the last 168 hours. Recent Labs  Lab 12/18/22 0940  AMMONIA <10   Coagulation Profile: No results for input(s): "INR", "PROTIME" in the last 168 hours. CBC: Recent Labs  Lab 12/18/22 0745 12/18/22 0755  WBC 5.4  --   NEUTROABS 3.3  --   HGB 11.9* 13.6  HCT 35.7* 40.0  MCV 91.1  --   PLT 275  --    Cardiac Enzymes: No results for input(s): "CKTOTAL", "CKMB", "CKMBINDEX", "TROPONINI" in the last 168 hours. BNP: Invalid input(s): "POCBNP" CBG: Recent Labs  Lab 12/18/22 0736  GLUCAP 94   HbA1C: No results for input(s): "HGBA1C" in the last 72 hours. Urine analysis:    Component Value Date/Time   COLORURINE STRAW (A) 12/18/2022 0823   APPEARANCEUR CLEAR 12/18/2022 0823   APPEARANCEUR Clear 10/11/2020 1407   LABSPEC 1.005 12/18/2022 0823   PHURINE 6.0 12/18/2022 0823   GLUCOSEU NEGATIVE 12/18/2022 0823   HGBUR NEGATIVE 12/18/2022 0823   BILIRUBINUR NEGATIVE 12/18/2022 0823   BILIRUBINUR Negative 10/11/2020 1407  KETONESUR NEGATIVE 12/18/2022 0823   PROTEINUR NEGATIVE 12/18/2022 0823   UROBILINOGEN 0.2 07/02/2011 0000   NITRITE NEGATIVE 12/18/2022 0823   LEUKOCYTESUR NEGATIVE 12/18/2022 0823   Sepsis Labs: @LABRCNTIP (procalcitonin:4,lacticidven:4) ) Recent Results (from the past 240 hour(s))  MRSA Next Gen by PCR, Nasal     Status: None   Collection Time: 12/18/22  6:19 PM   Specimen: Nasal Mucosa; Nasal Swab  Result Value Ref Range Status   MRSA by PCR Next Gen NOT DETECTED NOT DETECTED Final    Comment: (NOTE) The GeneXpert MRSA Assay (FDA approved for NASAL specimens only), is one component of a comprehensive MRSA colonization surveillance program. It is not intended to diagnose MRSA infection nor to guide or monitor treatment for MRSA infections. Test performance is not FDA approved in patients less than 31  years old. Performed at [facility], [address], [place], [place] [address]      Scheduled Meds:  amLODipine  5 mg Oral Daily   metoprolol tartrate  2.5 mg Intravenous Q6H   pantoprazole (PROTONIX) IV  40 mg Intravenous Q24H   Continuous Infusions:  sodium chloride 50 mL/hr at 12-27-2022 0358   0.9 % NaCl with KCl 20 mEq / L      Procedures/Studies: EEG adult  Result Date: December 27, 2022 Charlsie Quest, MD     12-27-22  8:45 AM Patient Name: FOLASADE MARENTETTE MRN: [id number] Epilepsy Attending: Charlsie Quest Referring Physician/Provider: Catarina Hartshorn, MD Date: 12/18/2022 Duration: 22.46 mins Patient history: 89yo F with ams getting eeg to evaluate for seizure. Level of alertness: Awake AEDs during EEG study: None Technical aspects: This EEG study was done with scalp electrodes positioned according to the 10-20 International system of electrode placement. Electrical activity was reviewed with band pass filter of 1-70Hz , sensitivity of 7 uV/mm, display speed of 28mm/sec with a 60Hz  notched filter applied as appropriate. EEG data were recorded continuously and digitally stored.  Video monitoring was available and reviewed as appropriate. Description: EEG showed continuous generalized predominantly 5 to 6 Hz theta slowing admixed with intermittent 2-3Hz  delta slowing. Hyperventilation and photic stimulation were not performed.   ABNORMALITY - Continuous slow, generalized IMPRESSION: This study is suggestive of moderate diffuse encephalopathy, nonspecific etiology. No seizures or epileptiform discharges were seen throughout the recording. Charlsie Quest   CT Head Wo Contrast  Result Date: 12/18/2022 CLINICAL DATA:  Mental status change EXAM: CT HEAD WITHOUT CONTRAST TECHNIQUE: Contiguous axial images were obtained from the base of the skull through the vertex without intravenous contrast. RADIATION DOSE REDUCTION: This exam was performed according to the departmental dose-optimization program  which includes automated exposure control, adjustment of the mA and/or kV according to patient size and/or use of iterative reconstruction technique. COMPARISON:  Head CT 12/18/2022 FINDINGS: Brain: Thin left frontal subdural hematoma measuring 3 mm appears unchanged from prior. No new separate areas of hemorrhage. No acute infarct, mass effect or midline shift. No hydrocephalus. Again seen is mild diffuse atrophy and extensive periventricular and deep white matter hypodensity, likely chronic small vessel ischemic change. Vascular: Atherosclerotic calcifications are present within the cavernous internal carotid arteries. Skull: Normal. Negative for fracture or focal lesion. Sinuses/Orbits: No acute finding. Other: None. IMPRESSION: 1. Unchanged thin left frontal subdural hematoma measuring 3 mm. No new separate areas of hemorrhage. No mass effect or midline shift. 2. Stable atrophy and chronic small vessel ischemic change. Electronically Signed   By: Darliss Cheney M.D.   On: 12/18/2022 15:24   CT CHEST  ABDOMEN PELVIS WO CONTRAST  Result Date: 12/18/2022 CLINICAL DATA:  Unintended weight loss, altered mental status EXAM: CT CHEST, ABDOMEN AND PELVIS WITHOUT CONTRAST TECHNIQUE: Multidetector CT imaging of the chest, abdomen and pelvis was performed following the standard protocol without IV contrast. RADIATION DOSE REDUCTION: This exam was performed according to the departmental dose-optimization program which includes automated exposure control, adjustment of the mA and/or kV according to patient size and/or use of iterative reconstruction technique. COMPARISON:  CT pelvis done on 11/07/2022 FINDINGS: CT CHEST FINDINGS Cardiovascular: Coronary artery calcifications are seen. Calcification is seen in mitral annulus. Pacemaker leads are noted in place. There are scattered calcifications in thoracic aorta and its major branches. Small pericardial effusion is present. Mediastinum/Nodes: No significant lymphadenopathy  is seen. Lungs/Pleura: Breathing motion limits evaluation. As far as seen, there is no focal pulmonary consolidation or discrete nodules. There is no pleural effusion or pneumothorax. Musculoskeletal: Degenerative changes are noted with encroachment of neural foramina in lower cervical and upper thoracic spine. No acute findings are seen. CT ABDOMEN PELVIS FINDINGS Hepatobiliary: No focal abnormalities are seen in liver. There is no dilation of bile ducts. There is mild wall thickening in the gallbladder. Pancreas: No focal abnormalities are seen. Spleen: Spleen appears smaller than usual in size. Adrenals/Urinary Tract: Adrenals are unremarkable. There is slight prominence of collecting system in the left kidney. Right kidney is smaller than left. There are no renal or ureteral stones. Urinary bladder is unremarkable. Stomach/Bowel: Large hiatal hernia is seen. There is no significant small bowel dilation. The appendix is not seen. There is no wall thickening in colon. Multiple diverticula seen in colon without evidence of focal acute diverticulitis. Vascular/Lymphatic: Extensive arterial calcifications are seen. Reproductive: There are small calcified uterine fibroids. Other: There is no ascites or pneumoperitoneum. Musculoskeletal: There is moderate anterolisthesis at L5-S1 level. Spondylolysis is seen in the L5 vertebra. There is 60% decrease in height of body of L5 vertebra with no interval change. IMPRESSION: No acute findings are seen a noncontrast CT chest, abdomen and pelvis. There are no infiltrates or nodules in the lung fields. Aortic arteriosclerosis. Coronary artery disease. There is no evidence of intestinal obstruction or pneumoperitoneum. There is slight prominence of the pelvocaliceal system in the left kidney which may be a normal variation or suggest residual change from previous obstruction. There are no demonstrable renal or ureteral stones. Large fixed hiatal hernia. Aortic arteriosclerosis.  Diverticulosis of colon without signs of focal diverticulitis. Old compression fracture is seen in the body of L5 vertebra. Spondylolysis is seen in the L5 vertebra with spondylolisthesis at the L5-S1 level. These findings appear stable. Degenerative changes are noted with encroachment of neural foramina in lower cervical and upper thoracic spine. Electronically Signed   By: Ernie Avena M.D.   On: 12/18/2022 10:16   CT HEAD WO CONTRAST  Result Date: 12/18/2022 CLINICAL DATA:  Provided history: Mental status change, unknown cause. Altered mental status. EXAM: CT HEAD WITHOUT CONTRAST TECHNIQUE: Contiguous axial images were obtained from the base of the skull through the vertex without intravenous contrast. RADIATION DOSE REDUCTION: This exam was performed according to the departmental dose-optimization program which includes automated exposure control, adjustment of the mA and/or kV according to patient size and/or use of iterative reconstruction technique. COMPARISON:  Head CT 11/07/2022. FINDINGS: Mildly motion degraded exam. Brain: Generalized cerebral atrophy. Apparent hyperdensity overlying portions of the lateral left frontal lobe, which may reflect a thin subdural hematoma (3 mm in thickness) versus artifact from motion (for  instance as seen on series 3, image 18) (series 5, image 37). Patchy and ill-defined hypoattenuation within the cerebral white matter, nonspecific but compatible with advanced chronic small vessel ischemic disease. No demarcated cortical infarct. No evidence of an intracranial mass. No midline shift. Vascular: No hyperdense vessel.  Atherosclerotic calcifications. Skull: No fracture or aggressive osseous lesion. Sinuses/Orbits: No mass or acute finding within the imaged orbits. Minimal mucosal thickening within the bilateral maxillary sinuses. Small-volume frothy secretions within the right sphenoid sinus. Trace mucosal thickening within bilateral ethmoid air cells. Impression  #2 called by telephone at the time of interpretation on 12/18/2022 at 8:40 am to provider [facility] , who verbally acknowledged these results. IMPRESSION: 1. Mildly motion degraded exam. 2. Possible thin acute subdural hematoma overlying the lateral left frontal lobe (versus artifact from motion). A short-interval follow-up non-contrast head CT is recommended. 3. Parenchymal atrophy and chronic small vessel ischemic disease. 4. Mild paranasal sinus disease as described. Electronically Signed   By: Jackey Loge D.O.   On: 12/18/2022 08:43   DG Chest Portable 1 View  Result Date: 12/18/2022 CLINICAL DATA:  One 89 year old female with altered mental status, last known well 0300 hours. EXAM: PORTABLE CHEST 1 VIEW COMPARISON:  Total chest 03/28/2022 and earlier. FINDINGS: Portable AP semi upright view at 0752 hours. Mildly rotated to the right. Stable cardiomegaly and mediastinal contours. Stable left chest cardiac pacemaker. Large lung volumes. Visualized tracheal air column is within normal limits. Allowing for portable technique the lungs are clear. No pneumothorax or pleural effusion identified. Paucity of bowel gas in the visible abdomen. No acute osseous abnormality identified. IMPRESSION: Stable cardiomegaly. Pulmonary hyperinflation. No acute cardiopulmonary abnormality. Electronically Signed   By: Odessa Fleming M.D.   On: 12/18/2022 08:00    Catarina Hartshorn, DO  [facility]  If 7PM-7AM, please contact night-coverage [email or web address] Password [email or web address] 12/19/2022, 1:20 PM   LOS: 0 days

## 2022-12-20 DIAGNOSIS — S065XAA Traumatic subdural hemorrhage with loss of consciousness status unknown, initial encounter: Secondary | ICD-10-CM

## 2022-12-20 DIAGNOSIS — G9341 Metabolic encephalopathy: Secondary | ICD-10-CM | POA: Diagnosis not present

## 2022-12-20 DIAGNOSIS — R627 Adult failure to thrive: Secondary | ICD-10-CM

## 2022-12-20 LAB — BASIC METABOLIC PANEL
Anion gap: 10 (ref 5–15)
BUN: 15 mg/dL (ref 8–23)
CO2: 23 mmol/L (ref 22–32)
Calcium: 8.9 mg/dL (ref 8.9–10.3)
Chloride: 96 mmol/L — ABNORMAL LOW (ref 98–111)
Creatinine, Ser: 0.66 mg/dL (ref 0.44–1.00)
GFR, Estimated: >60 mL/min (ref >60)
Glucose, Bld: 128 mg/dL — ABNORMAL HIGH (ref 70–99)
Potassium: 3.8 mmol/L (ref 3.5–5.1)
Sodium: 129 mmol/L — ABNORMAL LOW (ref 135–145)

## 2022-12-20 LAB — CBC
HCT: 36.9 % (ref 36.0–46.0)
Hemoglobin: 12.3 g/dL (ref 12.0–15.0)
MCH: 30.6 pg (ref 26.0–34.0)
MCHC: 33.3 g/dL (ref 30.0–36.0)
MCV: 91.8 fL (ref 80.0–100.0)
Platelets: 243 10*3/uL (ref 150–400)
RBC: 4.02 MIL/uL (ref 3.87–5.11)
RDW: 15.1 % (ref 11.5–15.5)
WBC: 11.8 10*3/uL — ABNORMAL HIGH (ref 4.0–10.5)
nRBC: 0 % (ref 0.0–0.2)

## 2022-12-20 MED ORDER — LORAZEPAM 2 MG/ML PO CONC
1.0000 mg | ORAL | Status: DC | PRN
  Filled 2022-12-20: qty 0.5

## 2022-12-20 MED ORDER — LORAZEPAM 2 MG/ML IJ SOLN: 1.0000 mg | INTRAMUSCULAR | Status: DC | PRN

## 2022-12-20 MED ORDER — POLYVINYL ALCOHOL 1.4 % OP SOLN: 1.0000 [drp] | Freq: Four times a day (QID) | OPHTHALMIC | Status: DC | PRN

## 2022-12-20 MED ORDER — HALOPERIDOL 0.5 MG PO TABS: 0.5000 mg | ORAL_TABLET | ORAL | Status: DC | PRN

## 2022-12-20 MED ORDER — HALOPERIDOL LACTATE 2 MG/ML PO CONC: 0.5000 mg | ORAL | Status: DC | PRN

## 2022-12-20 MED ORDER — MORPHINE SULFATE (PF) 2 MG/ML IV SOLN
2.0000 mg | INTRAVENOUS | Status: DC | PRN
  Administered 2022-12-20 – 2022-12-22 (×3): 2 mg via INTRAVENOUS
  Filled 2022-12-20 (×3): qty 1

## 2022-12-20 MED ORDER — GLYCOPYRROLATE 0.2 MG/ML IJ SOLN
0.2000 mg | INTRAMUSCULAR | Status: DC | PRN
  Administered 2022-12-20: 0.2 mg via INTRAVENOUS
  Filled 2022-12-20: qty 1

## 2022-12-20 MED ORDER — LORAZEPAM 1 MG PO TABS: 1.0000 mg | ORAL_TABLET | ORAL | Status: DC | PRN

## 2022-12-20 MED ORDER — GLYCOPYRROLATE 1 MG PO TABS: 1.0000 mg | ORAL_TABLET | ORAL | Status: DC | PRN

## 2022-12-20 MED ORDER — GLYCOPYRROLATE 0.2 MG/ML IJ SOLN: 0.2000 mg | INTRAMUSCULAR | Status: DC | PRN

## 2022-12-20 MED ORDER — HALOPERIDOL LACTATE 5 MG/ML IJ SOLN
0.5000 mg | INTRAMUSCULAR | Status: DC | PRN
  Administered 2022-12-22: 0.5 mg via INTRAVENOUS
  Filled 2022-12-20: qty 1

## 2022-12-20 NOTE — Progress Notes (Signed)
Daily Progress Note   Patient Name: Kelsey Thompson       Date: 12/20/2022 DOB: Jul 01, 1921  Age: 89 y.o. MRN#: [id number] Attending Physician: Kendell Bane, MD Primary Care Physician: System, Provider Not In Admit Date: 12/18/2022  Reason for Consultation/Follow-up: Establishing goals of care  Patient Profile/HPI:    89 y.o. female  with past medical history of dementia, HTN, GERD, CVA with L side hemiparesis, a fib, HLD, lives at [facility] admitted on 12/18/2022 with altered mental status. Etiology of AMS is unclear. Hyponatremic at 132 on admit- 131 today. Urine negative. CBC WNL. CT of head with very small acute SDH overlying the L cerebral hemisphere that was unchanged in size on repeat but distribution changed to overlying L parietal lobe, parenchymal atrophy and chronic small vessel ischemic disease. EEG resulted in diffuse encephalopathy- nonspecific etiology.   Subjective: Chart reviewed including labs, progress notes, imaging from this and previous encounters.  Evaluated patient and met with daughter, Kelsey Thompson, at the bedside.  Patient's spouse was killed in a car accident when Kelsey Thompson was [age]. Kelsey Thompson is only surviving child of Kelsey Thompson. Kelsey Thompson and her mother are very close. 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 She found joy in gardening flowers.  Noted she is having fluctuations in her heart rate. Per daughter she has a pacemaker and 14.5 years ago they were told the battery only had about 15 years of life left- she has/is not a candidate for procedure for battery replacement.  Unfortunately, Raylynne is again unresponsive.  We discussed option for transition to comfort care and hospice.  Discussed transition to comfort measures  only which includes stopping IV fluids, antibiotics, labs and providing symptom management for SOB, anxiety, nausea, vomiting, and other symptoms of dying. Kelsey Thompson is in agreement with this plan. Kelsey Thompson requests patient be evaluated for [facility] hospice house.   Review of Systems  Unable to perform ROS: Mental status change     Physical Exam Vitals and nursing note reviewed.  Constitutional:      Comments: Frail, cachetic  HENT:     Mouth/Throat:     Comments: Scab on top lip  Cardiovascular:     Rate and Rhythm: Normal rate.  Neurological:     Comments: unresponsive  Vital Signs: BP (!) 154/98 (BP Location: Right Arm)   Pulse (!) 115   Temp 98.5 F (36.9 C)   Resp 11   Ht 5\' 6"  (1.676 m)   Wt 44.2 kg   SpO2 98%   BMI 15.73 kg/m  SpO2: SpO2: 98 % O2 Device: O2 Device: Room Air O2 Flow Rate:    Intake/output summary:  Intake/Output Summary (Last 24 hours) at 12/20/2022 1126 Last data filed at 12/20/2022 0650 Gross per 24 hour  Intake 1242.5 ml  Output 1100 ml  Net 142.5 ml   LBM:   Baseline Weight: Weight: 44.2 kg Most recent weight: Weight: 44.2 kg       Palliative Assessment/Data: PPS: 10%      Patient Active Problem List   Diagnosis Date Noted   Acute encephalopathy 12/19/2022   Acute metabolic encephalopathy 12/18/2022   Dysphagia    Acute hypoxemic respiratory failure (HCC) 03/27/2022   Hematemesis 03/26/2022   GI bleed 03/25/2022   Altered behavior 10/20/2020   Hemiparesis affecting left side as late effect of cerebrovascular accident (CVA) (HCC) 05/19/2019   Paroxysmal atrial fibrillation (HCC) 01/21/2019   Hyperlipidemia LDL goal <70 01/21/2019   Embolic stroke (HCC) R brain s/p tPA d/t AF 01/16/2019   Acute pain of left knee 07/17/2014   Sinoatrial node dysfunction (HCC) 09/26/2011   Cardiac pacemaker in situ 11/19/2008   Essential hypertension 11/07/2008   BRADYCARDIA 11/07/2008   GERD 11/07/2008   Diaphragmatic hernia 11/07/2008     Palliative Care Assessment & Plan    Assessment/Recommendations/Plan  Transition to full comfort measures only Comfort medications as ordered Appreciate TOC assisting with referral to hospice   Code Status: DNR  Prognosis:  < 2 weeks  Discharge Planning: Hospice facility pending evaluation by hospice and availability  Care plan was discussed with patient's daughter and care team.   Thank you for allowing the Palliative Medicine Team to assist in the care of this patient.  Total time:  80 minutes Prolonged billing: yes Greater than 50%  of this time was spent counseling and coordinating care related to the above assessment and plan.  Ocie Bob, AGNP-C Palliative Medicine   Please contact Palliative Medicine Team phone at [phone] for questions and concerns.

## 2022-12-20 NOTE — Progress Notes (Signed)
PROGRESS NOTE    Patient: Kelsey Thompson                            PCP: System, Provider Not In                    DOB: [birth year]            DOA: 12/18/2022 ZOX:[id number]             DOS: 12/20/2022, 1:07 PM   LOS: 1 day   Date of Service: The patient was seen and examined on 12/20/2022  Subjective:   The patient was seen and examined this morning. Encephalopathic eyes open spontaneously does not withdraw to pain, does not follow command, nonverbal.  Brief Narrative:   89 year old female with a history of paroxysmal atrial fibrillation, hypertension, stroke with left hemiparesis, hyperlipidemia, dementia, GERD presenting with altered mental status from [facility].  Interestingly, it was noted that the staff at [facility] noted the patient to be at her usual baseline at 3 AM on the morning of 12/18/2022.  However, it is unclear exactly what they were doing with the patient at 3 AM. Nevertheless, the patient's daughter at the bedside supplements the history.  At baseline, the patient is essentially wheelchair-bound needing assistance with transfers, feeding, and dressing.  The patient is able to speak otherwise pleasantly confused.  Daughter states that the patient was at her usual baseline in the afternoon of 12/17/2022.  Daughter was contacted by [facility] the morning of 12/18/22 telling her that they were sending the patient to the hospital for altered mental status.  Notably, the patient was sent to the emergency department at [facility] on 12/15/22 for confusion and combativeness.  Review the medical record shows that the patient was sent there for a psych evaluation and to obtain a urinalysis as requested by her assisted living facility.  Routine blood work showed a sodium 130, serum creatinine 0.7, potassium 3.5, bicarbonate 26.  LFTs were unremarkable.  Urinalysis was negative for significant pyuria.  CBC showed WBC 5.9, hemoglobin 10.4, platelets 224,000.  Imaging of the brain was not obtained.   The patient was discharged back to [facility] in stable condition. Daughter states that she saw the patient on 12/16/2022 after she returned from the ED, and felt that the patient was near her usual baseline.  Daughter states that there has been no new complaints.  Specifically, no fevers, chills, chest pain, shortness breath, abdominal pain, vomiting, diarrhea.  There is been no recent falls in the past month.  There is not been any new medications.  In the ED at [facility], the patient was afebrile and hemodynamically stable with oxygen saturation 97% on room air.  WBC 5.4, hemoglobin 9.9, platelets 225,000.  Sodium 132, potassium 3.5, bicarbonate 26, serum creatinine 0.98.  CT of the brain had motion artifact with possible thin SDH overlying the left lateral frontal lobe versus possible artifact.  Chest x-ray showed hyperinflation with questionable RLL opacity.  CT of the chest was negative for consolidation.  CT of the abdomen and pelvis was negative for any colon wall thickening or obstruction.  There is no acute findings in the CT of the abdomen.  VBG showed 7.4 5/39/51/27.  Ammonia was less than 10.  Troponin 18>> 18.  Patient was admitted for further evaluation and treatment of her altered mental status. -------------------------------------------------------------------------------------------------------------    Assessment & Plan:   Principal Problem:   Acute  metabolic encephalopathy Active Problems:   Essential hypertension   Sinoatrial node dysfunction (HCC)   Paroxysmal atrial fibrillation (HCC)   Dysphagia   Acute encephalopathy    Acute metabolic encephalopathy/Subdural hematoma Progressively worsening metabolic encephalopathy -Daughter present at bedside after extensive discussion confirming DNR/DNI status, pursuing comfort care    -Etiology--suspect may be due to SDH -ABG--7.49/37/83/28 on RA -12/18/2022 CT brain--possible thin SDH overlying left lateral frontal lobe  versus artifact -6/17 repeat CT brain--Unchanged thin left frontal subdural hematoma measuring 3 mm. No new separate areas of hemorrhage. No mass effect or midline shift. -EEG--no seizure -B12--1059 -TSH--0.740 -Ammonia <10 -UA negative for pyuria   Paroxysmal atrial fibrillation -Currently not on anticoagulation secondary to GI bleed and advanced age -IV lopressor in lieu of po metoprolol -Pacemaker -Rate controlled   Essential hypertension -Continue amlodipine/metoprolol   Dementia without behavioral disturbance -Discontinued trazodone and Seroquel home doses   Hyponatremia Status post IV fluid normal saline, discontinue   GERD Was on pantoprazole   History of stroke with left hemiparesis -PT evaluation if mental status improves   Dysphagia -Patient is currently on a dysphagia 1 diet -NPO now on the comfort care   Sinus node dysfunction -s/p PPM   Goals of Care -discussed with daughter--confirmes DNR -Discussed extensively with daughter at bedside, agreed to pursuing with comfort care due to steep decline  -Pending hospice evaluation in a.m.           Family Communication:   daughter at bedside 6/19   Consultants:  palliative   Code Status: DNR -comfort care   DVT Prophylaxis:  SCD   Code Status:   Code Status: DNR  Family Communication: Discussed with her daughter at bedside 12/20/2022 The above findings and plan of care has been discussed with patient (and family)  in detail,  they expressed understanding and agreement of above. -Advance care planning has been discussed.   Palliative care and [facility] board -  social worker has contacted hospice care who will evaluate in a.m.  Admission status:   Status is: Inpatient Remains inpatient appropriate because: Pursuing comfort care   Disposition: From  - home             Planning for discharge in 1-2 days: to   Procedures:   No admission procedures for hospital encounter.   Antimicrobials:   Anti-infectives (From admission, onward)    None        Medication:   metoprolol tartrate  2.5 mg Intravenous Q6H   pantoprazole (PROTONIX) IV  40 mg Intravenous Q24H    acetaminophen, glycopyrrolate **OR** glycopyrrolate **OR** glycopyrrolate, haloperidol **OR** haloperidol **OR** haloperidol lactate, LORazepam **OR** LORazepam **OR** LORazepam, morphine injection, ondansetron (ZOFRAN) IV, polyvinyl alcohol   Objective:   Vitals:   12/19/22 1329 12/19/22 2157 12/20/22 0504 12/20/22 0813  BP: 115/67 (!) 148/90 (!) 146/79 (!) 154/98  Pulse: 68 (!) 52  (!) 115  Resp:  18 18 11   Temp: 98.6 F (37 C) 97.7 F (36.5 C) 98.5 F (36.9 C)   TempSrc:      SpO2: 93% 94% 98% 98%  Weight:      Height:        Intake/Output Summary (Last 24 hours) at 12/20/2022 1307 Last data filed at 12/20/2022 0650 Gross per 24 hour  Intake 1242.5 ml  Output 1100 ml  Net 142.5 ml   Filed Weights   12/18/22 0810  Weight: 44.2 kg     Physical examination:   Constitution: Continuously opens her eyes  nonverbal not following any commands Does not appear to be in distress HEENT:        Normocephalic, PERRL, otherwise with in Normal limits  Chest:         Chest symmetric Cardio vascular: Irregularly irregular pulmonary: Shallow breath sounds Abdomen: Soft, non-tender, non-distended, bowel sounds,no masses, no organomegaly Muscular skeletal: Limited exam - in bed, Neuro: Limited exam spontaneously open her eyes does not withdraw to pain stimuli does not follow commands Extremities: No pitting edema lower extremities, +2 pulses  Skin: Dry, warm to touch, negative for any Rashes, No open wounds Wounds: None visible   ------------------------------------------------------------------------------------------------------------------------------------------    LABs:     Latest Ref Rng & Units 12/20/2022    4:26 AM 12/18/2022    7:55 AM 12/18/2022    7:45 AM  CBC  WBC 4.0 - 10.5 K/uL 11.8    5.4   Hemoglobin 12.0 - 15.0 g/dL 95.6  21.3  08.6   Hematocrit 36.0 - 46.0 % 36.9  40.0  35.7   Platelets 150 - 400 K/uL 243   275       Latest Ref Rng & Units 12/20/2022    4:26 AM 12/19/2022    2:09 PM 12/18/2022    7:55 AM  CMP  Glucose 70 - 99 mg/dL 578  469  629   BUN 8 - 23 mg/dL 15  14  14    Creatinine 0.44 - 1.00 mg/dL 5.28  4.13  2.44   Sodium 135 - 145 mmol/L 129  131  135   Potassium 3.5 - 5.1 mmol/L 3.8  3.4  3.7   Chloride 98 - 111 mmol/L 96  96  97   CO2 22 - 32 mmol/L 23  27    Calcium 8.9 - 10.3 mg/dL 8.9  9.2    Total Protein 6.5 - 8.1 g/dL  7.0    Total Bilirubin 0.3 - 1.2 mg/dL  0.8    Alkaline Phos 38 - 126 U/L  71    AST 15 - 41 U/L  19    ALT 0 - 44 U/L  12         Micro Results Recent Results (from the past 240 hour(s))  MRSA Next Gen by PCR, Nasal     Status: None   Collection Time: 12/18/22  6:19 PM   Specimen: Nasal Mucosa; Nasal Swab  Result Value Ref Range Status   MRSA by PCR Next Gen NOT DETECTED NOT DETECTED Final    Comment: (NOTE) The GeneXpert MRSA Assay (FDA approved for NASAL specimens only), is one component of a comprehensive MRSA colonization surveillance program. It is not intended to diagnose MRSA infection nor to guide or monitor treatment for MRSA infections. Test performance is not FDA approved in patients less than 20 years old. Performed at [facility], [address] [place], [place] [address]     Radiology Reports No results found.  SIGNED: Kendell Bane, MD, FHM. FAAFP. [facility] - [facility] Time spent - 35 min.  In seeing, evaluating and examining the patient. Reviewing medical records, labs, drawn plan of care. [facility],  Pager (please use [email or web address] to page/ text) Please use Epic Secure Chat for non-urgent communication (7AM-7PM)  If 7PM-7AM, please contact night-coverage [email or web address], 12/20/2022, 1:07 PM

## 2022-12-20 NOTE — Progress Notes (Signed)
   12/20/22 0813  Vitals  BP (!) 154/98  MAP (mmHg) 116  BP Location Right Arm  BP Method Automatic  Pulse Rate (!) 115  Resp 11  Level of Consciousness  Level of Consciousness Responds to Voice  MEWS COLOR  MEWS Score Color Red  Oxygen Therapy  SpO2 98 %  O2 Device Room Air  Pain Assessment  Pain Scale PAINAD  Faces Pain Scale 0  MEWS Score  MEWS Temp 0  MEWS Systolic 0  MEWS Pulse 2  MEWS RR 1  MEWS LOC 1  MEWS Score 4    Md notified of change and daughter [name] was called with an update

## 2022-12-20 NOTE — Progress Notes (Signed)
   12/20/22 1356  Spiritual Encounters  Type of Visit Initial  Care provided to: Pt and family  Conversation partners present during encounter Nurse  Reason for visit End-of-life  OnCall Visit No  Spiritual Framework  Presenting Themes Meaning/purpose/sources of inspiration;Significant life change;Impactful experiences and emotions;Values and beliefs  Community/Connection Family;Friend(s);Faith community  Patient Stress Factors Loss;Major life changes  Family Stress Factors Loss  Interventions  Spiritual Care Interventions Made Established relationship of care and support;Compassionate presence;Reflective listening;Narrative/life review;Bereavement/grief support;Prayer;Supported grief process  Intervention Outcomes  Outcomes Connection to spiritual care;Awareness around self/spiritual resourses;Connection to values and goals of care;Awareness of support  Spiritual Care Plan  Spiritual Care Issues Still Outstanding Chaplain will continue to follow   Referred to bedside through IDT rounds today. Patient is now Comfort Care. Found [employer] in hospital bed with eyes closed, shallow breath, and very comfortable. Her daughter was present bedside. She greeted Customer service manager and this Clinical research associate engaged her in life review and reflection around Itzamar's life, upbringing, spiritual heritage, and important relationships. She shared openly today and became tearful when talking about her mothers EOL process. She is grateful for our hospital and the possibility to go to the Hospice home if needed. Chaplain provided blessing to end visit and will remain available in order to provide spiritual support and to assess for spiritual need.   Rev. Jolyn Lent, M.Div Chaplain

## 2022-12-20 NOTE — Progress Notes (Signed)
Pt lethargic, responsive to voice, follows some commands. Oral care provided multiple times during the night. Pillow placed between pt's knees and skin care provided to reduce risk of pressure injuries. Pt's BP elevated. Scheduled metoprolol given as ordered. Pt's rhythm has fluctuated between A-fib and V-tach, non-sustaining. RR WDL tonight. PAINAD score 0. Pt did not communicate much during the night, but did say "no" when asked if she was in any pain.

## 2022-12-20 NOTE — TOC Progression Note (Addendum)
Transition of Care [facility]) - Progression Note    Patient Details  Name: Kelsey Thompson MRN: [id number] Date of Birth: [birth year]  [facility] CM/SW Contact  Leitha Bleak, RN Phone Number: 12/20/2022, 11:16 AM  Clinical Narrative:   Family agreeable to residential hospice. Referral sent to [facility]. They do not have a bed today. They will sent a RN in the morning 10-10:30 to assess. Team updated.   Daughter updated, CM asked [employer] if they change the time to please contact  [name], she wanted to be present for the assessment.   Expected Discharge Plan: Hospice Medical Facility Barriers to Discharge: Other (must enter comment) (no hospice bed)  Expected Discharge Plan and Services In-house Referral: Clinical Social Work     Living arrangements for the past 2 months: Assisted Living Facility                              Social Determinants of Health (SDOH) Interventions SDOH Screenings   Food Insecurity: No Food Insecurity (12/18/2022)  Housing: Low Risk  (12/18/2022)  Transportation Needs: No Transportation Needs (12/18/2022)  Utilities: Not At Risk (12/18/2022)  Alcohol Screen: Low Risk  (04/26/2022)  Depression (PHQ2-9): Low Risk  (04/26/2022)  Financial Resource Strain: Low Risk  (04/26/2022)  Physical Activity: Inactive (04/26/2022)  Social Connections: Moderately Integrated (04/26/2022)  Stress: No Stress Concern Present (04/26/2022)  Tobacco Use: Medium Risk (12/18/2022)    Readmission Risk Interventions    03/30/2022   11:07 AM  Readmission Risk Prevention Plan  Post Dischage Appt Not Complete  Medication Screening Complete  Transportation Screening Complete

## 2022-12-21 DIAGNOSIS — G9341 Metabolic encephalopathy: Secondary | ICD-10-CM | POA: Diagnosis not present

## 2022-12-21 NOTE — Discharge Summary (Signed)
Physician Discharge Summary   Patient: Kelsey Thompson MRN: [id number] DOB: 01/11/1921  Admit date:     12/18/2022  Discharge date: 12/21/22  Discharge Physician: Kendell Bane   PCP: System, Provider Not In   Recommendations at discharge:   Follow-up with hospice-hospice care, comfort care  Discharge Diagnoses: Principal Problem:   Acute metabolic encephalopathy Active Problems:   Essential hypertension   Sinoatrial node dysfunction (HCC)   Paroxysmal atrial fibrillation (HCC)   Dysphagia   Acute encephalopathy  Resolved Problems:   * No resolved hospital problems. *  Hospital Course: 89 year old female with a history of paroxysmal atrial fibrillation, hypertension, stroke with left hemiparesis, hyperlipidemia, dementia, GERD presenting with altered mental status from [facility].  Interestingly, it was noted that the staff at [facility] noted the patient to be at her usual baseline at 3 AM on the morning of 12/18/2022.  However, it is unclear exactly what they were doing with the patient at 3 AM. Nevertheless, the patient's daughter at the bedside supplements the history.  At baseline, the patient is essentially wheelchair-bound needing assistance with transfers, feeding, and dressing.  The patient is able to speak otherwise pleasantly confused.  Daughter states that the patient was at her usual baseline in the afternoon of 12/17/2022.  Daughter was contacted by [facility] the morning of 12/18/22 telling her that they were sending the patient to the hospital for altered mental status.  Notably, the patient was sent to the emergency department at [facility] on 12/15/22 for confusion and combativeness.  Review the medical record shows that the patient was sent there for a psych evaluation and to obtain a urinalysis as requested by her assisted living facility.  Routine blood work showed a sodium 130, serum creatinine 0.7, potassium 3.5, bicarbonate 26.  LFTs were unremarkable.  Urinalysis was  negative for significant pyuria.  CBC showed WBC 5.9, hemoglobin 10.4, platelets 224,000.  Imaging of the brain was not obtained.  The patient was discharged back to [facility] in stable condition. Daughter states that she saw the patient on 12/16/2022 after she returned from the ED, and felt that the patient was near her usual baseline.  Daughter states that there has been no new complaints.  Specifically, no fevers, chills, chest pain, shortness breath, abdominal pain, vomiting, diarrhea.  There is been no recent falls in the past month.  There is not been any new medications.  In the ED at [facility], the patient was afebrile and hemodynamically stable with oxygen saturation 97% on room air.  WBC 5.4, hemoglobin 9.9, platelets 225,000.  Sodium 132, potassium 3.5, bicarbonate 26, serum creatinine 0.98.  CT of the brain had motion artifact with possible thin SDH overlying the left lateral frontal lobe versus possible artifact.  Chest x-ray showed hyperinflation with questionable RLL opacity.  CT of the chest was negative for consolidation.  CT of the abdomen and pelvis was negative for any colon wall thickening or obstruction.  There is no acute findings in the CT of the abdomen.  VBG showed 7.4 5/39/51/27.  Ammonia was less than 10.  Troponin 18>> 18.  Patient was admitted for further evaluation and treatment of her altered mental status. -------------------------------------------------------------------------------------------------------------   Acute metabolic encephalopathy/Subdural hematoma Progressively worsening metabolic encephalopathy -Daughter present at bedside after extensive discussion confirming  DNR/DNI status, comfort care  On the palliative care-hospice-comfort care   -Etiology--suspect may be due to SDH -ABG--7.49/37/83/28 on RA -12/18/2022 CT brain--possible thin SDH overlying left lateral frontal lobe versus artifact -6/17 repeat  CT brain--Unchanged thin left frontal subdural  hematoma measuring 3 mm. No new separate areas of hemorrhage. No mass effect or midline shift. -EEG--no seizure -B12--1059 -TSH--0.740 -Ammonia <10 -UA negative for pyuria   Paroxysmal atrial fibrillation -Currently not on anticoagulation secondary to GI bleed and advanced age -IV lopressor in lieu of po metoprolol -Pacemaker -Rate controlled   Essential hypertension -Continue amlodipine/metoprolol   Dementia without behavioral disturbance -Discontinued trazodone and Seroquel home doses   Hyponatremia Status post IV fluid normal saline, discontinue   GERD Was on pantoprazole   History of stroke with left hemiparesis -PT evaluation if mental status improves   Dysphagia -Patient is currently on a dysphagia 1 diet -NPO now on the comfort care   Sinus node dysfunction -s/p PPM   Goals of Care -discussed with daughter--confirmes DNR -Discussed extensively with daughter at bedside, agreed to pursuing with comfort care due to steep decline  -Pending hospice evaluation in a.m.           Family Communication:   daughter at bedside 6/20   Code Status: DNR -comfort care       Consultants: Palliative care  Disposition: Patient hospice -hospice home Diet recommendation:  Discharge Diet Orders (From admission, onward)     Start     Ordered   12/21/22 0000  Diet general        12/21/22 0749           Regular diet DISCHARGE MEDICATION: Allergies as of 12/21/2022       Reactions   Cefdinir    Per MAR   Horse-derived Products Other (See Comments)   Tetanus:Reaction unknown   Penicillins Other (See Comments)   Did it involve swelling of the face/tongue/throat, SOB, or low BP? Yes-swelling Did it involve sudden or severe rash/hives, skin peeling, or any reaction on the inside of your mouth or nose? No Did you need to seek medical attention at a hospital or doctor's office? Yes-Dr. office When did it last happen?  4 years prior     If all above answers are  "NO", may proceed with cephalosporin use.   Tetanus Antitoxin    Unknown reaction        Medication List     STOP taking these medications    acetaminophen 500 MG tablet Commonly known as: TYLENOL   amLODipine 5 MG tablet Commonly known as: NORVASC   ascorbic acid 500 MG tablet Commonly known as: VITAMIN C   cholecalciferol 25 MCG (1000 UNIT) tablet Commonly known as: VITAMIN D3   ferrous sulfate 325 (65 FE) MG tablet   metoprolol tartrate 25 MG tablet Commonly known as: LOPRESSOR   omeprazole 40 MG capsule Commonly known as: PRILOSEC   polyethylene glycol 17 g packet Commonly known as: MIRALAX / GLYCOLAX   QUEtiapine 25 MG tablet Commonly known as: SEROquel   sodium chloride 1 g tablet   SYSTANE COMPLETE OP        Discharge Exam: Filed Weights   12/18/22 0810  Weight: 44.2 kg        General:  Spontaneously opens her eyes, nonverbal, not communicating, in a vegetative state  HEENT:  Normocephalic, PERRL, otherwise with in Normal limits   Neuro:  Limited exam noninteractive  Lungs:   Labored breathing, negative for wheezes or crackles  Cardio:    Irregularly irregular  Abdomen:  Soft, non-tender, bowel sounds active all four quadrants, no guarding or peritoneal signs.  Muscular  skeletal:  Limited exam -global generalized weaknesses 2+  pulses,  symmetric, No pitting edema  Skin:  Dry, warm to touch, negative for any Rashes,  Wounds: Please see nursing documentation          Condition at discharge: critical  The results of significant diagnostics from this hospitalization (including imaging, microbiology, ancillary and laboratory) are listed below for reference.   Imaging Studies: CT HEAD WO CONTRAST ( )  Result Date: 12/19/2022 CLINICAL DATA:  Subdural hematoma. Altered mental status. EXAM: CT HEAD WITHOUT CONTRAST TECHNIQUE: Contiguous axial images were obtained from the base of the skull through the vertex without intravenous  contrast. RADIATION DOSE REDUCTION: This exam was performed according to the departmental dose-optimization program which includes automated exposure control, adjustment of the mA and/or kV according to patient size and/or use of iterative reconstruction technique. COMPARISON:  Prior head CT examinations 12/08/2022 and earlier. FINDINGS: Brain: Mild generalized cerebral atrophy. A thin acute subdural hematoma overlying the left cerebral hemisphere has not significantly changed in size (3 mm in thickness), but has changed in distribution, now greatest overlying left parietal lobe (series 5, image 38). Patchy and ill-defined hypoattenuation within the cerebral white matter, nonspecific but compatible with advanced chronic small vessel ischemic disease. No demarcated cortical infarct. No evidence of an intracranial mass. No midline shift. Vascular: No hyperdense vessel.  Atherosclerotic calcifications. Skull: No fracture or aggressive osseous lesion. Sinuses/Orbits: No mass or acute finding within the imaged orbits. Mucosal thickening (and possible fluid) within anterior right ethmoid air cells, overall moderate in severity. Minimal mucosal thickening within the right sphenoid and bilateral maxillary sinuses. IMPRESSION: 1. A thin acute subdural hematoma overlying left cerebral hemisphere is unchanged in size (3 mm in thickness), but has changed in distribution, now most prominent overlying the left parietal lobe. 2. Parenchymal atrophy and chronic small vessel ischemic disease. 3. Paranasal sinus disease as described. Electronically Signed   By: Jackey Loge D.O.   On: 12/19/2022 14:24   EEG adult  Result Date: 12/19/2022 Charlsie Quest, MD     12/19/2022  8:45 AM Patient Name: Kelsey Thompson MRN: [id number] Epilepsy Attending: Charlsie Quest Referring Physician/Provider: Catarina Hartshorn, MD Date: 12/18/2022 Duration: 22.46 mins Patient history: 89yo F with ams getting eeg to evaluate for seizure. Level of  alertness: Awake AEDs during EEG study: None Technical aspects: This EEG study was done with scalp electrodes positioned according to the 10-20 International system of electrode placement. Electrical activity was reviewed with band pass filter of 1-70Hz , sensitivity of 7 uV/mm, display speed of 69mm/sec with a 60Hz  notched filter applied as appropriate. EEG data were recorded continuously and digitally stored.  Video monitoring was available and reviewed as appropriate. Description: EEG showed continuous generalized predominantly 5 to 6 Hz theta slowing admixed with intermittent 2-3Hz  delta slowing. Hyperventilation and photic stimulation were not performed.   ABNORMALITY - Continuous slow, generalized IMPRESSION: This study is suggestive of moderate diffuse encephalopathy, nonspecific etiology. No seizures or epileptiform discharges were seen throughout the recording. Charlsie Quest   CT Head Wo Contrast  Result Date: 12/18/2022 CLINICAL DATA:  Mental status change EXAM: CT HEAD WITHOUT CONTRAST TECHNIQUE: Contiguous axial images were obtained from the base of the skull through the vertex without intravenous contrast. RADIATION DOSE REDUCTION: This exam was performed according to the departmental dose-optimization program which includes automated exposure control, adjustment of the mA and/or kV according to patient size and/or use of iterative reconstruction technique. COMPARISON:  Head CT 12/18/2022 FINDINGS: Brain: Thin left frontal subdural hematoma measuring 3 mm appears  unchanged from prior. No new separate areas of hemorrhage. No acute infarct, mass effect or midline shift. No hydrocephalus. Again seen is mild diffuse atrophy and extensive periventricular and deep white matter hypodensity, likely chronic small vessel ischemic change. Vascular: Atherosclerotic calcifications are present within the cavernous internal carotid arteries. Skull: Normal. Negative for fracture or focal lesion. Sinuses/Orbits:  No acute finding. Other: None. IMPRESSION: 1. Unchanged thin left frontal subdural hematoma measuring 3 mm. No new separate areas of hemorrhage. No mass effect or midline shift. 2. Stable atrophy and chronic small vessel ischemic change. Electronically Signed   By: Darliss Cheney M.D.   On: 12/18/2022 15:24   CT CHEST ABDOMEN PELVIS WO CONTRAST  Result Date: 12/18/2022 CLINICAL DATA:  Unintended weight loss, altered mental status EXAM: CT CHEST, ABDOMEN AND PELVIS WITHOUT CONTRAST TECHNIQUE: Multidetector CT imaging of the chest, abdomen and pelvis was performed following the standard protocol without IV contrast. RADIATION DOSE REDUCTION: This exam was performed according to the departmental dose-optimization program which includes automated exposure control, adjustment of the mA and/or kV according to patient size and/or use of iterative reconstruction technique. COMPARISON:  CT pelvis done on 11/07/2022 FINDINGS: CT CHEST FINDINGS Cardiovascular: Coronary artery calcifications are seen. Calcification is seen in mitral annulus. Pacemaker leads are noted in place. There are scattered calcifications in thoracic aorta and its major branches. Small pericardial effusion is present. Mediastinum/Nodes: No significant lymphadenopathy is seen. Lungs/Pleura: Breathing motion limits evaluation. As far as seen, there is no focal pulmonary consolidation or discrete nodules. There is no pleural effusion or pneumothorax. Musculoskeletal: Degenerative changes are noted with encroachment of neural foramina in lower cervical and upper thoracic spine. No acute findings are seen. CT ABDOMEN PELVIS FINDINGS Hepatobiliary: No focal abnormalities are seen in liver. There is no dilation of bile ducts. There is mild wall thickening in the gallbladder. Pancreas: No focal abnormalities are seen. Spleen: Spleen appears smaller than usual in size. Adrenals/Urinary Tract: Adrenals are unremarkable. There is slight prominence of collecting  system in the left kidney. Right kidney is smaller than left. There are no renal or ureteral stones. Urinary bladder is unremarkable. Stomach/Bowel: Large hiatal hernia is seen. There is no significant small bowel dilation. The appendix is not seen. There is no wall thickening in colon. Multiple diverticula seen in colon without evidence of focal acute diverticulitis. Vascular/Lymphatic: Extensive arterial calcifications are seen. Reproductive: There are small calcified uterine fibroids. Other: There is no ascites or pneumoperitoneum. Musculoskeletal: There is moderate anterolisthesis at L5-S1 level. Spondylolysis is seen in the L5 vertebra. There is 60% decrease in height of body of L5 vertebra with no interval change. IMPRESSION: No acute findings are seen a noncontrast CT chest, abdomen and pelvis. There are no infiltrates or nodules in the lung fields. Aortic arteriosclerosis. Coronary artery disease. There is no evidence of intestinal obstruction or pneumoperitoneum. There is slight prominence of the pelvocaliceal system in the left kidney which may be a normal variation or suggest residual change from previous obstruction. There are no demonstrable renal or ureteral stones. Large fixed hiatal hernia. Aortic arteriosclerosis. Diverticulosis of colon without signs of focal diverticulitis. Old compression fracture is seen in the body of L5 vertebra. Spondylolysis is seen in the L5 vertebra with spondylolisthesis at the L5-S1 level. These findings appear stable. Degenerative changes are noted with encroachment of neural foramina in lower cervical and upper thoracic spine. Electronically Signed   By: Ernie Avena M.D.   On: 12/18/2022 10:16   CT HEAD WO CONTRAST  Result Date: 12/18/2022 CLINICAL DATA:  Provided history: Mental status change, unknown cause. Altered mental status. EXAM: CT HEAD WITHOUT CONTRAST TECHNIQUE: Contiguous axial images were obtained from the base of the skull through the vertex  without intravenous contrast. RADIATION DOSE REDUCTION: This exam was performed according to the departmental dose-optimization program which includes automated exposure control, adjustment of the mA and/or kV according to patient size and/or use of iterative reconstruction technique. COMPARISON:  Head CT 11/07/2022. FINDINGS: Mildly motion degraded exam. Brain: Generalized cerebral atrophy. Apparent hyperdensity overlying portions of the lateral left frontal lobe, which may reflect a thin subdural hematoma (3 mm in thickness) versus artifact from motion (for instance as seen on series 3, image 18) (series 5, image 37). Patchy and ill-defined hypoattenuation within the cerebral white matter, nonspecific but compatible with advanced chronic small vessel ischemic disease. No demarcated cortical infarct. No evidence of an intracranial mass. No midline shift. Vascular: No hyperdense vessel.  Atherosclerotic calcifications. Skull: No fracture or aggressive osseous lesion. Sinuses/Orbits: No mass or acute finding within the imaged orbits. Minimal mucosal thickening within the bilateral maxillary sinuses. Small-volume frothy secretions within the right sphenoid sinus. Trace mucosal thickening within bilateral ethmoid air cells. Impression #2 called by telephone at the time of interpretation on 12/18/2022 at 8:40 am to provider [facility] , who verbally acknowledged these results. IMPRESSION: 1. Mildly motion degraded exam. 2. Possible thin acute subdural hematoma overlying the lateral left frontal lobe (versus artifact from motion). A short-interval follow-up non-contrast head CT is recommended. 3. Parenchymal atrophy and chronic small vessel ischemic disease. 4. Mild paranasal sinus disease as described. Electronically Signed   By: Jackey Loge D.O.   On: 12/18/2022 08:43   DG Chest Portable 1 View  Result Date: 12/18/2022 CLINICAL DATA:  One 89 year old female with altered mental status, last known well 0300  hours. EXAM: PORTABLE CHEST 1 VIEW COMPARISON:  Total chest 03/28/2022 and earlier. FINDINGS: Portable AP semi upright view at 0752 hours. Mildly rotated to the right. Stable cardiomegaly and mediastinal contours. Stable left chest cardiac pacemaker. Large lung volumes. Visualized tracheal air column is within normal limits. Allowing for portable technique the lungs are clear. No pneumothorax or pleural effusion identified. Paucity of bowel gas in the visible abdomen. No acute osseous abnormality identified. IMPRESSION: Stable cardiomegaly. Pulmonary hyperinflation. No acute cardiopulmonary abnormality. Electronically Signed   By: Odessa Fleming M.D.   On: 12/18/2022 08:00    Microbiology: Results for orders placed or performed during the hospital encounter of 12/18/22  MRSA Next Gen by PCR, Nasal     Status: None   Collection Time: 12/18/22  6:19 PM   Specimen: Nasal Mucosa; Nasal Swab  Result Value Ref Range Status   MRSA by PCR Next Gen NOT DETECTED NOT DETECTED Final    Comment: (NOTE) The GeneXpert MRSA Assay (FDA approved for NASAL specimens only), is one component of a comprehensive MRSA colonization surveillance program. It is not intended to diagnose MRSA infection nor to guide or monitor treatment for MRSA infections. Test performance is not FDA approved in patients less than 11 years old. Performed at [facility], [address], [place], [place] [address]     Labs: CBC: Recent Labs  Lab 12/18/22 0745 12/18/22 0755 12/20/22 0426  WBC 5.4  --  11.8*  NEUTROABS 3.3  --   --   HGB 11.9* 13.6 12.3  HCT 35.7* 40.0 36.9  MCV 91.1  --  91.8  PLT 275  --  243   Basic Metabolic Panel: Recent Labs  Lab 12/18/22 0745 12/18/22 0755 12/19/22 1409 12/20/22 0426  NA 132* 135 131* 129*  K 3.5 3.7 3.4* 3.8  CL 97* 97* 96* 96*  CO2 26  --  27 23  GLUCOSE 109* 109* 110* 128*  BUN 15 14 14 15   CREATININE 0.98 1.00 0.74 0.66  CALCIUM 9.0  --  9.2 8.9  MG 2.0  --   --   --    Liver  Function Tests: Recent Labs  Lab 12/18/22 0745 12/19/22 1409  AST 13* 19  ALT 10 12  ALKPHOS 75 71  BILITOT 0.5 0.8  PROT 7.0 7.0  ALBUMIN 3.6 3.5   CBG: Recent Labs  Lab 12/18/22 0736  GLUCAP 94    Discharge time spent: greater than 45 minutes.  Signed: Kendell Bane, MD [facility] 12/21/2022

## 2022-12-21 NOTE — Care Management Important Message (Signed)
Important Message  Patient Details  Name: Kelsey Thompson MRN: [id number] Date of Birth: 13-Jan-1921   Medicare Important Message Given:  N/A - LOS <3 / Initial given by admissions     Corey Harold 12/21/2022, 9:44 AM

## 2022-12-21 NOTE — Progress Notes (Signed)
Patient responds to pain at times, oral care provided to patient for comfort measures, no s/s of pain or agitation, patient sleeping most of day , family supportive and at bedside this afternoon . Patient is disoriented x 4, Hospice to see patient today , no update on when patient will transfer. She is resting comfortable in bed at this time, repositioned for comfort every 2 hours.

## 2022-12-21 NOTE — TOC Progression Note (Signed)
Transition of Care [facility]) - Progression Note    Patient Details  Name: Kelsey Thompson MRN: [id number] Date of Birth: January 03, 1921  [facility] of [facility]) CM/SW Contact  Leitha Bleak, RN Phone Number: 12/21/2022, 12:58 PM  Clinical Narrative:     Hospice accepted they do not have beds. Ok to flip chart to GIP, team updated.   Expected Discharge Plan: Hospice Medical Facility Barriers to Discharge: Other (must enter comment) (no hospice bed)  Expected Discharge Plan and Services In-house Referral: Clinical Social Work     Living arrangements for the past 2 months: Assisted Living Facility Expected Discharge Date: 12/21/22                   Social Determinants of Health (SDOH) Interventions SDOH Screenings   Food Insecurity: No Food Insecurity (12/18/2022)  Housing: Low Risk  (12/18/2022)  Transportation Needs: No Transportation Needs (12/18/2022)  Utilities: Not At Risk (12/18/2022)  Alcohol Screen: Low Risk  (04/26/2022)  Depression (PHQ2-9): Low Risk  (04/26/2022)  Financial Resource Strain: Low Risk  (04/26/2022)  Physical Activity: Inactive (04/26/2022)  Social Connections: Moderately Integrated (04/26/2022)  Stress: No Stress Concern Present (04/26/2022)  Tobacco Use: Medium Risk (12/18/2022)    Readmission Risk Interventions    03/30/2022   11:07 AM  Readmission Risk Prevention Plan  Post Dischage Appt Not Complete  Medication Screening Complete  Transportation Screening Complete

## 2022-12-22 ENCOUNTER — Inpatient Hospital Stay (HOSPITAL_COMMUNITY)
Admission: RE | Admit: 2022-12-22 | Discharge: 2022-12-23 | DRG: 064 | Disposition: A | Discharge status: Hospice | Source: Skilled Nursing Facility | Attending: Family Medicine | Admitting: Family Medicine

## 2022-12-22 DIAGNOSIS — Z881 Allergy status to other antibiotic agents status: Secondary | ICD-10-CM

## 2022-12-22 DIAGNOSIS — E871 Hypo-osmolality and hyponatremia: Secondary | ICD-10-CM | POA: Diagnosis present

## 2022-12-22 DIAGNOSIS — Z91048 Other nonmedicinal substance allergy status: Secondary | ICD-10-CM

## 2022-12-22 DIAGNOSIS — I69354 Hemiplegia and hemiparesis following cerebral infarction affecting left non-dominant side: Secondary | ICD-10-CM

## 2022-12-22 DIAGNOSIS — K219 Gastro-esophageal reflux disease without esophagitis: Secondary | ICD-10-CM | POA: Diagnosis present

## 2022-12-22 DIAGNOSIS — Z880 Allergy status to penicillin: Secondary | ICD-10-CM

## 2022-12-22 DIAGNOSIS — I6200 Nontraumatic subdural hemorrhage, unspecified: Principal | ICD-10-CM | POA: Diagnosis present

## 2022-12-22 DIAGNOSIS — Z66 Do not resuscitate: Secondary | ICD-10-CM | POA: Diagnosis present

## 2022-12-22 DIAGNOSIS — Z79899 Other long term (current) drug therapy: Secondary | ICD-10-CM

## 2022-12-22 DIAGNOSIS — I480 Paroxysmal atrial fibrillation: Secondary | ICD-10-CM | POA: Diagnosis present

## 2022-12-22 DIAGNOSIS — Z515 Encounter for palliative care: Secondary | ICD-10-CM

## 2022-12-22 DIAGNOSIS — R1310 Dysphagia, unspecified: Secondary | ICD-10-CM | POA: Diagnosis present

## 2022-12-22 DIAGNOSIS — Z8719 Personal history of other diseases of the digestive system: Secondary | ICD-10-CM

## 2022-12-22 DIAGNOSIS — I10 Essential (primary) hypertension: Secondary | ICD-10-CM | POA: Diagnosis present

## 2022-12-22 DIAGNOSIS — I495 Sick sinus syndrome: Secondary | ICD-10-CM | POA: Diagnosis present

## 2022-12-22 DIAGNOSIS — E7800 Pure hypercholesterolemia, unspecified: Secondary | ICD-10-CM | POA: Diagnosis present

## 2022-12-22 DIAGNOSIS — Z993 Dependence on wheelchair: Secondary | ICD-10-CM

## 2022-12-22 DIAGNOSIS — F03C0 Unspecified dementia, severe, without behavioral disturbance, psychotic disturbance, mood disturbance, and anxiety: Secondary | ICD-10-CM | POA: Diagnosis present

## 2022-12-22 DIAGNOSIS — Z887 Allergy status to serum and vaccine status: Secondary | ICD-10-CM

## 2022-12-22 DIAGNOSIS — Z950 Presence of cardiac pacemaker: Secondary | ICD-10-CM

## 2022-12-22 DIAGNOSIS — G9341 Metabolic encephalopathy: Secondary | ICD-10-CM | POA: Diagnosis present

## 2022-12-22 MED ORDER — ONDANSETRON HCL 4 MG/2ML IJ SOLN: 4.0000 mg | Freq: Four times a day (QID) | INTRAMUSCULAR | Status: DC | PRN

## 2022-12-22 MED ORDER — LORAZEPAM 1 MG PO TABS: 1.0000 mg | ORAL_TABLET | ORAL | Status: DC | PRN

## 2022-12-22 MED ORDER — GLYCOPYRROLATE 1 MG PO TABS: 1.0000 mg | ORAL_TABLET | ORAL | Status: DC | PRN

## 2022-12-22 MED ORDER — LORAZEPAM 2 MG/ML IJ SOLN: 1.0000 mg | INTRAMUSCULAR | Status: DC | PRN

## 2022-12-22 MED ORDER — POLYVINYL ALCOHOL 1.4 % OP SOLN: 1.0000 [drp] | Freq: Four times a day (QID) | OPHTHALMIC | Status: DC | PRN

## 2022-12-22 MED ORDER — GLYCOPYRROLATE 0.2 MG/ML IJ SOLN: 0.2000 mg | INTRAMUSCULAR | Status: DC | PRN

## 2022-12-22 MED ORDER — DIPHENHYDRAMINE HCL 50 MG/ML IJ SOLN: 12.5000 mg | INTRAMUSCULAR | Status: DC | PRN

## 2022-12-22 MED ORDER — BIOTENE DRY MOUTH MT LIQD: 15.0000 mL | OROMUCOSAL | Status: DC | PRN

## 2022-12-22 MED ORDER — ONDANSETRON 4 MG PO TBDP: 4.0000 mg | ORAL_TABLET | Freq: Four times a day (QID) | ORAL | Status: DC | PRN

## 2022-12-22 MED ORDER — MORPHINE SULFATE (CONCENTRATE) 10 MG/0.5ML PO SOLN: 5.0000 mg | ORAL | Status: DC | PRN

## 2022-12-22 NOTE — Progress Notes (Signed)
   12/22/22 1517  Spiritual Encounters  Type of Visit Follow up  Care provided to: Patient  Referral source Chaplain assessment  Reason for visit End-of-life  OnCall Visit No   Mirna Mires went to visit Pt. who was sleeping. Chap stayed quietly with Pt. for a moment. Chap asked if Pt needed anything. Chap remained in respectful silence. Mirna Mires remains available in case needed.

## 2022-12-22 NOTE — Progress Notes (Signed)
Physician Discharge Summary   Patient: Kelsey Thompson MRN: [id number] DOB: July 21, 1920  Admit date:     12/18/2022  Discharge date: 12/22/22  Discharge Physician: Kendell Bane   PCP: System, Provider Not In   The patient was seen and examined this morning no changes .Marland Kitchen Laying comfortably in bed unresponsive  Hospital still does not have a bed, we will transition patient to GIP  Recommendations at discharge:   Follow-up with hospice-hospice care, comfort care  Discharge Diagnoses: Principal Problem:   Acute metabolic encephalopathy Active Problems:   Essential hypertension   Sinoatrial node dysfunction (HCC)   Paroxysmal atrial fibrillation (HCC)   Dysphagia   Acute encephalopathy  Resolved Problems:   * No resolved hospital problems. *  Hospital Course: 89 year old female with a history of paroxysmal atrial fibrillation, hypertension, stroke with left hemiparesis, hyperlipidemia, dementia, GERD presenting with altered mental status from [facility].  Interestingly, it was noted that the staff at [facility] noted the patient to be at her usual baseline at 3 AM on the morning of 12/18/2022.  However, it is unclear exactly what they were doing with the patient at 3 AM. Nevertheless, the patient's daughter at the bedside supplements the history.  At baseline, the patient is essentially wheelchair-bound needing assistance with transfers, feeding, and dressing.  The patient is able to speak otherwise pleasantly confused.  Daughter states that the patient was at her usual baseline in the afternoon of 12/17/2022.  Daughter was contacted by [facility] the morning of 12/18/22 telling her that they were sending the patient to the hospital for altered mental status.  Notably, the patient was sent to the emergency department at [facility] on 12/15/22 for confusion and combativeness.  Review the medical record shows that the patient was sent there for a psych evaluation and to obtain a urinalysis as  requested by her assisted living facility.  Routine blood work showed a sodium 130, serum creatinine 0.7, potassium 3.5, bicarbonate 26.  LFTs were unremarkable.  Urinalysis was negative for significant pyuria.  CBC showed WBC 5.9, hemoglobin 10.4, platelets 224,000.  Imaging of the brain was not obtained.  The patient was discharged back to [facility] in stable condition. Daughter states that she saw the patient on 12/16/2022 after she returned from the ED, and felt that the patient was near her usual baseline.  Daughter states that there has been no new complaints.  Specifically, no fevers, chills, chest pain, shortness breath, abdominal pain, vomiting, diarrhea.  There is been no recent falls in the past month.  There is not been any new medications.  In the ED at [facility], the patient was afebrile and hemodynamically stable with oxygen saturation 97% on room air.  WBC 5.4, hemoglobin 9.9, platelets 225,000.  Sodium 132, potassium 3.5, bicarbonate 26, serum creatinine 0.98.  CT of the brain had motion artifact with possible thin SDH overlying the left lateral frontal lobe versus possible artifact.  Chest x-ray showed hyperinflation with questionable RLL opacity.  CT of the chest was negative for consolidation.  CT of the abdomen and pelvis was negative for any colon wall thickening or obstruction.  There is no acute findings in the CT of the abdomen.  VBG showed 7.4 5/39/51/27.  Ammonia was less than 10.  Troponin 18>> 18.  Patient was admitted for further evaluation and treatment of her altered mental status. -------------------------------------------------------------------------------------------------------------   Acute metabolic encephalopathy/Subdural hematoma Progressively worsening metabolic encephalopathy -Daughter present at bedside after extensive discussion confirming  DNR/DNI status, comfort care  On the palliative care-hospice-comfort care   -Etiology--suspect may be due to  SDH -ABG--7.49/37/83/28 on RA -12/18/2022 CT brain--possible thin SDH overlying left lateral frontal lobe versus artifact -6/17 repeat CT brain--Unchanged thin left frontal subdural hematoma measuring 3 mm. No new separate areas of hemorrhage. No mass effect or midline shift. -EEG--no seizure -B12--1059 -TSH--0.740 -Ammonia <10 -UA negative for pyuria   Paroxysmal atrial fibrillation -Currently not on anticoagulation secondary to GI bleed and advanced age -IV lopressor in lieu of po metoprolol -Pacemaker -Rate controlled   Essential hypertension -Continue amlodipine/metoprolol   Dementia without behavioral disturbance -Discontinued trazodone and Seroquel home doses   Hyponatremia Status post IV fluid normal saline, discontinue   GERD Was on pantoprazole   History of stroke with left hemiparesis -PT evaluation if mental status improves   Dysphagia -Patient is currently on a dysphagia 1 diet -NPO now on the comfort care   Sinus node dysfunction -s/p PPM   Goals of Care -discussed with daughter--confirmes DNR -Discussed extensively with daughter at bedside, agreed to pursuing with comfort care due to steep decline  -Pending hospice evaluation in a.m.           Family Communication:   daughter at bedside 6/20   Code Status: DNR -comfort care       Consultants: Palliative care  Disposition: Patient hospice -hospice home Diet recommendation:  Discharge Diet Orders (From admission, onward)     Start     Ordered   12/21/22 0000  Diet general        12/21/22 0749           Regular diet DISCHARGE MEDICATION: Allergies as of 12/22/2022       Reactions   Cefdinir    Per MAR   Horse-derived Products Other (See Comments)   Tetanus:Reaction unknown   Penicillins Other (See Comments)   Did it involve swelling of the face/tongue/throat, SOB, or low BP? Yes-swelling Did it involve sudden or severe rash/hives, skin peeling, or any reaction on the inside of  your mouth or nose? No Did you need to seek medical attention at a hospital or doctor's office? Yes-Dr. office When did it last happen?  4 years prior     If all above answers are "NO", may proceed with cephalosporin use.   Tetanus Antitoxin    Unknown reaction        Medication List     STOP taking these medications    acetaminophen 500 MG tablet Commonly known as: TYLENOL   amLODipine 5 MG tablet Commonly known as: NORVASC   ascorbic acid 500 MG tablet Commonly known as: VITAMIN C   cholecalciferol 25 MCG (1000 UNIT) tablet Commonly known as: VITAMIN D3   ferrous sulfate 325 (65 FE) MG tablet   metoprolol tartrate 25 MG tablet Commonly known as: LOPRESSOR   omeprazole 40 MG capsule Commonly known as: PRILOSEC   polyethylene glycol 17 g packet Commonly known as: MIRALAX / GLYCOLAX   QUEtiapine 25 MG tablet Commonly known as: SEROquel   sodium chloride 1 g tablet   SYSTANE COMPLETE OP        Contact information for after-discharge care     Destination     [facility] .   Service: Inpatient Hospice Contact information: [address] [place] [address] [phone]                    Discharge Exam: Ceasar Mons Weights   12/18/22 0810  Weight:  44.2 kg     Patient was seen and examined-no changes   General:  Spontaneously opens her eyes, nonverbal, not communicating, in a vegetative state  HEENT:  Normocephalic, PERRL, otherwise with in Normal limits   Neuro:  Limited exam noninteractive  Lungs:   Labored breathing, negative for wheezes or crackles  Cardio:    Irregularly irregular  Abdomen:  Soft, non-tender, bowel sounds active all four quadrants, no guarding or peritoneal signs.  Muscular  skeletal:  Limited exam -global generalized weaknesses 2+ pulses,  symmetric, No pitting edema  Skin:  Dry, warm to touch, negative for any Rashes,  Wounds: Please see nursing documentation           Condition at discharge: critical  The results of significant diagnostics from this hospitalization (including imaging, microbiology, ancillary and laboratory) are listed below for reference.   Imaging Studies: CT HEAD WO CONTRAST ( )  Result Date: 12/19/2022 CLINICAL DATA:  Subdural hematoma. Altered mental status. EXAM: CT HEAD WITHOUT CONTRAST TECHNIQUE: Contiguous axial images were obtained from the base of the skull through the vertex without intravenous contrast. RADIATION DOSE REDUCTION: This exam was performed according to the departmental dose-optimization program which includes automated exposure control, adjustment of the mA and/or kV according to patient size and/or use of iterative reconstruction technique. COMPARISON:  Prior head CT examinations 12/08/2022 and earlier. FINDINGS: Brain: Mild generalized cerebral atrophy. A thin acute subdural hematoma overlying the left cerebral hemisphere has not significantly changed in size (3 mm in thickness), but has changed in distribution, now greatest overlying left parietal lobe (series 5, image 38). Patchy and ill-defined hypoattenuation within the cerebral white matter, nonspecific but compatible with advanced chronic small vessel ischemic disease. No demarcated cortical infarct. No evidence of an intracranial mass. No midline shift. Vascular: No hyperdense vessel.  Atherosclerotic calcifications. Skull: No fracture or aggressive osseous lesion. Sinuses/Orbits: No mass or acute finding within the imaged orbits. Mucosal thickening (and possible fluid) within anterior right ethmoid air cells, overall moderate in severity. Minimal mucosal thickening within the right sphenoid and bilateral maxillary sinuses. IMPRESSION: 1. A thin acute subdural hematoma overlying left cerebral hemisphere is unchanged in size (3 mm in thickness), but has changed in distribution, now most prominent overlying the left parietal lobe. 2. Parenchymal atrophy and chronic  small vessel ischemic disease. 3. Paranasal sinus disease as described. Electronically Signed   By: Jackey Loge D.O.   On: 12/19/2022 14:24   EEG adult  Result Date: 12/19/2022 Charlsie Quest, MD     12/19/2022  8:45 AM Patient Name: Kelsey Thompson MRN: [id number] Epilepsy Attending: Charlsie Quest Referring Physician/Provider: Catarina Hartshorn, MD Date: 12/18/2022 Duration: 22.46 mins Patient history: 89yo F with ams getting eeg to evaluate for seizure. Level of alertness: Awake AEDs during EEG study: None Technical aspects: This EEG study was done with scalp electrodes positioned according to the 10-20 International system of electrode placement. Electrical activity was reviewed with band pass filter of 1-70Hz , sensitivity of 7 uV/mm, display speed of 63mm/sec with a 60Hz  notched filter applied as appropriate. EEG data were recorded continuously and digitally stored.  Video monitoring was available and reviewed as appropriate. Description: EEG showed continuous generalized predominantly 5 to 6 Hz theta slowing admixed with intermittent 2-3Hz  delta slowing. Hyperventilation and photic stimulation were not performed.   ABNORMALITY - Continuous slow, generalized IMPRESSION: This study is suggestive of moderate diffuse encephalopathy, nonspecific etiology. No seizures or epileptiform discharges were seen throughout the recording. Priyanka Annabelle Harman  CT Head Wo Contrast  Result Date: 12/18/2022 CLINICAL DATA:  Mental status change EXAM: CT HEAD WITHOUT CONTRAST TECHNIQUE: Contiguous axial images were obtained from the base of the skull through the vertex without intravenous contrast. RADIATION DOSE REDUCTION: This exam was performed according to the departmental dose-optimization program which includes automated exposure control, adjustment of the mA and/or kV according to patient size and/or use of iterative reconstruction technique. COMPARISON:  Head CT 12/18/2022 FINDINGS: Brain: Thin left frontal subdural  hematoma measuring 3 mm appears unchanged from prior. No new separate areas of hemorrhage. No acute infarct, mass effect or midline shift. No hydrocephalus. Again seen is mild diffuse atrophy and extensive periventricular and deep white matter hypodensity, likely chronic small vessel ischemic change. Vascular: Atherosclerotic calcifications are present within the cavernous internal carotid arteries. Skull: Normal. Negative for fracture or focal lesion. Sinuses/Orbits: No acute finding. Other: None. IMPRESSION: 1. Unchanged thin left frontal subdural hematoma measuring 3 mm. No new separate areas of hemorrhage. No mass effect or midline shift. 2. Stable atrophy and chronic small vessel ischemic change. Electronically Signed   By: Darliss Cheney M.D.   On: 12/18/2022 15:24   CT CHEST ABDOMEN PELVIS WO CONTRAST  Result Date: 12/18/2022 CLINICAL DATA:  Unintended weight loss, altered mental status EXAM: CT CHEST, ABDOMEN AND PELVIS WITHOUT CONTRAST TECHNIQUE: Multidetector CT imaging of the chest, abdomen and pelvis was performed following the standard protocol without IV contrast. RADIATION DOSE REDUCTION: This exam was performed according to the departmental dose-optimization program which includes automated exposure control, adjustment of the mA and/or kV according to patient size and/or use of iterative reconstruction technique. COMPARISON:  CT pelvis done on 11/07/2022 FINDINGS: CT CHEST FINDINGS Cardiovascular: Coronary artery calcifications are seen. Calcification is seen in mitral annulus. Pacemaker leads are noted in place. There are scattered calcifications in thoracic aorta and its major branches. Small pericardial effusion is present. Mediastinum/Nodes: No significant lymphadenopathy is seen. Lungs/Pleura: Breathing motion limits evaluation. As far as seen, there is no focal pulmonary consolidation or discrete nodules. There is no pleural effusion or pneumothorax. Musculoskeletal: Degenerative changes are  noted with encroachment of neural foramina in lower cervical and upper thoracic spine. No acute findings are seen. CT ABDOMEN PELVIS FINDINGS Hepatobiliary: No focal abnormalities are seen in liver. There is no dilation of bile ducts. There is mild wall thickening in the gallbladder. Pancreas: No focal abnormalities are seen. Spleen: Spleen appears smaller than usual in size. Adrenals/Urinary Tract: Adrenals are unremarkable. There is slight prominence of collecting system in the left kidney. Right kidney is smaller than left. There are no renal or ureteral stones. Urinary bladder is unremarkable. Stomach/Bowel: Large hiatal hernia is seen. There is no significant small bowel dilation. The appendix is not seen. There is no wall thickening in colon. Multiple diverticula seen in colon without evidence of focal acute diverticulitis. Vascular/Lymphatic: Extensive arterial calcifications are seen. Reproductive: There are small calcified uterine fibroids. Other: There is no ascites or pneumoperitoneum. Musculoskeletal: There is moderate anterolisthesis at L5-S1 level. Spondylolysis is seen in the L5 vertebra. There is 60% decrease in height of body of L5 vertebra with no interval change. IMPRESSION: No acute findings are seen a noncontrast CT chest, abdomen and pelvis. There are no infiltrates or nodules in the lung fields. Aortic arteriosclerosis. Coronary artery disease. There is no evidence of intestinal obstruction or pneumoperitoneum. There is slight prominence of the pelvocaliceal system in the left kidney which may be a normal variation or suggest residual change from previous  obstruction. There are no demonstrable renal or ureteral stones. Large fixed hiatal hernia. Aortic arteriosclerosis. Diverticulosis of colon without signs of focal diverticulitis. Old compression fracture is seen in the body of L5 vertebra. Spondylolysis is seen in the L5 vertebra with spondylolisthesis at the L5-S1 level. These findings  appear stable. Degenerative changes are noted with encroachment of neural foramina in lower cervical and upper thoracic spine. Electronically Signed   By: Ernie Avena M.D.   On: 12/18/2022 10:16   CT HEAD WO CONTRAST  Result Date: 12/18/2022 CLINICAL DATA:  Provided history: Mental status change, unknown cause. Altered mental status. EXAM: CT HEAD WITHOUT CONTRAST TECHNIQUE: Contiguous axial images were obtained from the base of the skull through the vertex without intravenous contrast. RADIATION DOSE REDUCTION: This exam was performed according to the departmental dose-optimization program which includes automated exposure control, adjustment of the mA and/or kV according to patient size and/or use of iterative reconstruction technique. COMPARISON:  Head CT 11/07/2022. FINDINGS: Mildly motion degraded exam. Brain: Generalized cerebral atrophy. Apparent hyperdensity overlying portions of the lateral left frontal lobe, which may reflect a thin subdural hematoma (3 mm in thickness) versus artifact from motion (for instance as seen on series 3, image 18) (series 5, image 37). Patchy and ill-defined hypoattenuation within the cerebral white matter, nonspecific but compatible with advanced chronic small vessel ischemic disease. No demarcated cortical infarct. No evidence of an intracranial mass. No midline shift. Vascular: No hyperdense vessel.  Atherosclerotic calcifications. Skull: No fracture or aggressive osseous lesion. Sinuses/Orbits: No mass or acute finding within the imaged orbits. Minimal mucosal thickening within the bilateral maxillary sinuses. Small-volume frothy secretions within the right sphenoid sinus. Trace mucosal thickening within bilateral ethmoid air cells. Impression #2 called by telephone at the time of interpretation on 12/18/2022 at 8:40 am to provider [facility] , who verbally acknowledged these results. IMPRESSION: 1. Mildly motion degraded exam. 2. Possible thin acute subdural  hematoma overlying the lateral left frontal lobe (versus artifact from motion). A short-interval follow-up non-contrast head CT is recommended. 3. Parenchymal atrophy and chronic small vessel ischemic disease. 4. Mild paranasal sinus disease as described. Electronically Signed   By: Jackey Loge D.O.   On: 12/18/2022 08:43   DG Chest Portable 1 View  Result Date: 12/18/2022 CLINICAL DATA:  One 89 year old female with altered mental status, last known well 0300 hours. EXAM: PORTABLE CHEST 1 VIEW COMPARISON:  Total chest 03/28/2022 and earlier. FINDINGS: Portable AP semi upright view at 0752 hours. Mildly rotated to the right. Stable cardiomegaly and mediastinal contours. Stable left chest cardiac pacemaker. Large lung volumes. Visualized tracheal air column is within normal limits. Allowing for portable technique the lungs are clear. No pneumothorax or pleural effusion identified. Paucity of bowel gas in the visible abdomen. No acute osseous abnormality identified. IMPRESSION: Stable cardiomegaly. Pulmonary hyperinflation. No acute cardiopulmonary abnormality. Electronically Signed   By: Odessa Fleming M.D.   On: 12/18/2022 08:00    Microbiology: Results for orders placed or performed during the hospital encounter of 12/18/22  MRSA Next Gen by PCR, Nasal     Status: None   Collection Time: 12/18/22  6:19 PM   Specimen: Nasal Mucosa; Nasal Swab  Result Value Ref Range Status   MRSA by PCR Next Gen NOT DETECTED NOT DETECTED Final    Comment: (NOTE) The GeneXpert MRSA Assay (FDA approved for NASAL specimens only), is one component of a comprehensive MRSA colonization surveillance program. It is not intended to diagnose MRSA infection  nor to guide or monitor treatment for MRSA infections. Test performance is not FDA approved in patients less than 34 years old. Performed at [facility], [address] [place], [place] [address]     Labs: CBC: Recent Labs  Lab 12/18/22 0745 12/18/22 0755  12/20/22 0426  WBC 5.4  --  11.8*  NEUTROABS 3.3  --   --   HGB 11.9* 13.6 12.3  HCT 35.7* 40.0 36.9  MCV 91.1  --  91.8  PLT 275  --  243   Basic Metabolic Panel: Recent Labs  Lab 12/18/22 0745 12/18/22 0755 12/19/22 1409 12/20/22 0426  NA 132* 135 131* 129*  K 3.5 3.7 3.4* 3.8  CL 97* 97* 96* 96*  CO2 26  --  27 23  GLUCOSE 109* 109* 110* 128*  BUN 15 14 14 15   CREATININE 0.98 1.00 0.74 0.66  CALCIUM 9.0  --  9.2 8.9  MG 2.0  --   --   --    Liver Function Tests: Recent Labs  Lab 12/18/22 0745 12/19/22 1409  AST 13* 19  ALT 10 12  ALKPHOS 75 71  BILITOT 0.5 0.8  PROT 7.0 7.0  ALBUMIN 3.6 3.5   CBG: Recent Labs  Lab 12/18/22 0736  GLUCAP 94    Discharge time spent: greater than 45 minutes.  Signed: Kendell Bane, MD [facility] 12/22/2022

## 2022-12-22 NOTE — H&P (Signed)
History and Physical   Patient: Kelsey Thompson                            PCP: System, Provider Not In                    DOB: [birth year]            DOA: 12/22/2022 [id number]             DOS: 12/22/2022, 12:48 PM  System, Provider Not In  Patient coming from:   HOME  I have personally reviewed patient's medical records, in electronic medical records, including:  [facility], and care everywhere.    PCP: System, Provider Not In    Recommendations at discharge:    Follow-up with hospice-hospice care, comfort care   Discharge Diagnoses: Principal Problem:   Acute metabolic encephalopathy Active Problems:   Essential hypertension   Sinoatrial node dysfunction (HCC)   Paroxysmal atrial fibrillation (HCC)   Dysphagia   Acute encephalopathy   Resolved Problems:   * No resolved hospital problems. *   Hospital Course: 89 year old female with a history of paroxysmal atrial fibrillation, hypertension, stroke with left hemiparesis, hyperlipidemia, dementia, GERD presenting with altered mental status from [facility].  Interestingly, it was noted that the staff at [facility] noted the patient to be at her usual baseline at 3 AM on the morning of 12/18/2022.  However, it is unclear exactly what they were doing with the patient at 3 AM. Nevertheless, the patient's daughter at the bedside supplements the history.  At baseline, the patient is essentially wheelchair-bound needing assistance with transfers, feeding, and dressing.  The patient is able to speak otherwise pleasantly confused.  Daughter states that the patient was at her usual baseline in the afternoon of 12/17/2022.  Daughter was contacted by [facility] the morning of 12/18/22 telling her that they were sending the patient to the hospital for altered mental status.   Notably, the patient was sent to the emergency department at [facility] on 12/15/22 for confusion and combativeness.  Review the medical record shows that the patient was  sent there for a psych evaluation and to obtain a urinalysis as requested by her assisted living facility.  Routine blood work showed a sodium 130, serum creatinine 0.7, potassium 3.5, bicarbonate 26.  LFTs were unremarkable.  Urinalysis was negative for significant pyuria.  CBC showed WBC 5.9, hemoglobin 10.4, platelets 224,000.  Imaging of the brain was not obtained.  The patient was discharged back to [facility] in stable condition. Daughter states that she saw the patient on 12/16/2022 after she returned from the ED, and felt that the patient was near her usual baseline.   Daughter states that there has been no new complaints.  Specifically, no fevers, chills, chest pain, shortness breath, abdominal pain, vomiting, diarrhea.  There is been no recent falls in the past month.  There is not been any new medications.   In the ED at [facility], the patient was afebrile and hemodynamically stable with oxygen saturation 97% on room air.  WBC 5.4, hemoglobin 9.9, platelets 225,000.  Sodium 132, potassium 3.5, bicarbonate 26, serum creatinine 0.98.  CT of the brain had motion artifact with possible thin SDH overlying the left lateral frontal lobe versus possible artifact.  Chest x-ray showed hyperinflation with questionable RLL opacity.  CT of the chest was negative for consolidation.  CT of the abdomen and pelvis was negative for  any colon wall thickening or obstruction.  There is no acute findings in the CT of the abdomen.  VBG showed 7.4 5/39/51/27.  Ammonia was less than 10.  Troponin 18>> 18.  Patient was admitted for further evaluation and treatment of her altered mental status. -------------------------------------------------------------------------------------------------------------     Acute metabolic encephalopathy/Subdural hematoma Progressively worsening metabolic encephalopathy -Daughter present at bedside after extensive discussion confirming  DNR/DNI status, comfort care   On the palliative  care-hospice-comfort care     -Etiology--suspect may be due to SDH -ABG--7.49/37/83/28 on RA -12/18/2022 CT brain--possible thin SDH overlying left lateral frontal lobe versus artifact -6/17 repeat CT brain--Unchanged thin left frontal subdural hematoma measuring 3 mm. No new separate areas of hemorrhage. No mass effect or midline shift. -EEG--no seizure -B12--1059 -TSH--0.740 -Ammonia <10 -UA negative for pyuria     Paroxysmal atrial fibrillation -Currently not on anticoagulation secondary to GI bleed and advanced age -IV lopressor in lieu of po metoprolol -Pacemaker -Rate controlled   Essential hypertension -Continue amlodipine/metoprolol   Dementia without behavioral disturbance -Discontinued trazodone and Seroquel home doses   Hyponatremia Status post IV fluid normal saline, discontinue   GERD Was on pantoprazole   History of stroke with left hemiparesis -PT evaluation if mental status improves   Dysphagia -Patient is currently on a dysphagia 1 diet -NPO now on the comfort care   Sinus node dysfunction -s/p PPM   Goals of Care -discussed with daughter--confirmes DNR -Discussed extensively with daughter at bedside, agreed to pursuing with comfort care due to steep decline   -Pending hospice evaluation in a.m.           Family Communication:   daughter at bedside 6/21   Code Status: DNR -comfort care       Consultants: Hospice    Disposition: Patient hospice -hospice home     PRN MEDs: antiseptic oral rinse, diphenhydrAMINE, glycopyrrolate **OR** glycopyrrolate **OR** glycopyrrolate, LORazepam **OR** LORazepam **OR** LORazepam, morphine CONCENTRATE **OR** morphine CONCENTRATE, ondansetron **OR** ondansetron (ZOFRAN) IV, polyvinyl alcohol  Past Medical History:  Diagnosis Date   Arthritis    Bradycardia    GERD (gastroesophageal reflux disease)    Hiatal hernia    Hypercholesterolemia    Hypertension    Pacemaker    Stroke [facility])    I have  personally reviewed following labs and imaging studies   SIGNED: Kendell Bane, MD, FHM. FAAFP. [place] - [facility], Pager  (Please use [email or web address] to page/ or secure chat through epic) If 7PM-7AM, please contact night-coverage [email or web address],  12/22/2022, 12:48 PM

## 2022-12-22 NOTE — TOC Progression Note (Signed)
Transition of Care [facility]) - Progression Note    Patient Details  Name: Kelsey Thompson MRN: [id number] Date of Birth: 1920/12/16  Transition of Care [facility]) CM/SW Contact  Leitha Bleak, RN Phone Number: 12/22/2022, 10:47 AM  Clinical Narrative:   Hospice here to see patient and daughter. They still do not have a bed at [facility]. Team updated to make patient GIP. [facility] will follow and possibly have a bed over the weekend.    Expected Discharge Plan: Hospice Medical Facility Barriers to Discharge: Other (must enter comment) (no hospice bed)  Expected Discharge Plan and Services In-house Referral: Clinical Social Work     Living arrangements for the past 2 months: Assisted Living Facility Expected Discharge Date: 12/21/22                                     Social Determinants of Health (SDOH) Interventions SDOH Screenings   Food Insecurity: No Food Insecurity (12/18/2022)  Housing: Low Risk  (12/18/2022)  Transportation Needs: No Transportation Needs (12/18/2022)  Utilities: Not At Risk (12/18/2022)  Alcohol Screen: Low Risk  (04/26/2022)  Depression (PHQ2-9): Low Risk  (04/26/2022)  Financial Resource Strain: Low Risk  (04/26/2022)  Physical Activity: Inactive (04/26/2022)  Social Connections: Moderately Integrated (04/26/2022)  Stress: No Stress Concern Present (04/26/2022)  Tobacco Use: Medium Risk (12/18/2022)    Readmission Risk Interventions    03/30/2022   11:07 AM  Readmission Risk Prevention Plan  Post Dischage Appt Not Complete  Medication Screening Complete  Transportation Screening Complete

## 2022-12-22 NOTE — Progress Notes (Signed)
Pt remains lethargic this shift, responsive to voice. Oral care provided, no s/s of pain or distress noted. Bed in lowest position, call bell in reach.

## 2022-12-23 DIAGNOSIS — I499 Cardiac arrhythmia, unspecified: Secondary | ICD-10-CM | POA: Diagnosis not present

## 2022-12-23 DIAGNOSIS — R404 Transient alteration of awareness: Secondary | ICD-10-CM | POA: Diagnosis not present

## 2022-12-23 DIAGNOSIS — R6889 Other general symptoms and signs: Secondary | ICD-10-CM | POA: Diagnosis not present

## 2022-12-23 DIAGNOSIS — Z743 Need for continuous supervision: Secondary | ICD-10-CM | POA: Diagnosis not present

## 2022-12-23 DIAGNOSIS — Z7401 Bed confinement status: Secondary | ICD-10-CM | POA: Diagnosis not present

## 2022-12-23 NOTE — Progress Notes (Addendum)
PROGRESS NOTE    Patient: Kelsey Thompson                            PCP: System, Provider Not In                    DOB: 1920/09/17            DOA: 12/22/2022 [id number]             DOS: 12/23/2022, 10:38 AM   LOS: 1 day   Date of Service: The patient was seen and examined on 12/23/2022  Subjective:   The patient was seen and examined this morning. Unresponsive does not withdraw to pain, does not follow command, nonverbal Laying in bed comfortable Brief Narrative:   Kelsey Thompson is a 89 year old female with history of paroxysmal atrial fibrillation, HTN, CVA with left-sided hemiparesis, HLD, dementia, GERD, presented with altered mental status from [facility]. At baseline patient is wheelchair-bound, severe dementia with confusion Patient was hospitalized for further evaluation noted for progressive metabolic encephalopathy complicated by subdural hematoma. He continued decline Patient daughter present at bedside daily palliative care was consulted, patient daughter finally agreed to pursue with comfort care-hospice     Assessment & Plan:   Principal Problem:   Hospice care  Acute metabolic encephalopathy/Subdural hematoma Progressively worsening metabolic encephalopathy -Daughter present at bedside after extensive discussion confirming  DNR/DNI status, comfort care   On the palliative care-hospice-comfort care     -Etiology--suspect may be due to SDH -ABG--7.49/37/83/28 on RA -12/18/2022 CT brain--possible thin SDH overlying left lateral frontal lobe versus artifact -6/17 repeat CT brain--Unchanged thin left frontal subdural hematoma measuring 3 mm. No new separate areas of hemorrhage. No mass effect or midline shift. -EEG--no seizure -B12--1059 -TSH--0.740 -Ammonia <10 -UA negative for pyuria     Paroxysmal atrial fibrillation -Currently not on anticoagulation secondary to GI bleed and advanced age -was on IV lopressor in lieu of po  metoprolol -Pacemaker    Essential hypertension -Discontinued amlodipine/metoprolol   Dementia without behavioral disturbance -Discontinued trazodone and Seroquel home doses   Hyponatremia- S/P IV fluid normal saline, discontinue   GERD Was on pantoprazole   History of stroke with left hemiparesis -PT evaluation if mental status improves   Dysphagia-  now on the comfort care   Sinus node dysfunction -s/p PPM   Goals of Care -discussed with daughter--confirmes DNR -Discussed extensively with daughter at bedside, agreed to pursuing with comfort care due to steep decline   Code Status:   Code Status: DNR  Family Communication: Gust with daughter, under hospice/comfort care   Disposition: From  - home             Planning to discharge to hospice home when bed available  Procedures:   No admission procedures for hospital encounter.   Antimicrobials:  Anti-infectives (From admission, onward)    None        Medication:    antiseptic oral rinse, diphenhydrAMINE, glycopyrrolate **OR** glycopyrrolate **OR** glycopyrrolate, LORazepam **OR** LORazepam **OR** LORazepam, morphine CONCENTRATE **OR** morphine CONCENTRATE, ondansetron **OR** ondansetron (ZOFRAN) IV, polyvinyl alcohol   Objective:   Vitals:   12/23/22 0921  BP: 122/60  Pulse: (!) 122  Resp: 16    Intake/Output Summary (Last 24 hours) at 12/23/2022 1038 Last data filed at 12/23/2022 0900 Gross per 24 hour  Intake 0 ml  Output 100 ml  Net -100 ml   There were no vitals  filed for this visit.   Physical examination:      General:  Unresponsive-does not follow command, does not withdraw to pain stimuli Spontaneously breathing  HEENT:  Normocephalic, PERRL, otherwise with in Normal limits   Neuro:  Exam unresponsive  Lungs:   Shallow breathing No wheezes / crackles  Cardio:    S1/S2, RRR, No murmure, No Rubs or Gallops   Abdomen:  Soft, non-tender, no guarding or peritoneal signs.  Muscular   skeletal:  Limited exam -unresponsive 2+ pulses,  symmetric, No pitting edema  Skin:  Dry, warm to touch, negative for any Rashes,  Wounds: Please see nursing documentation        ursing documentation   ------------------------------------------------------------------------------------------------------------------------------------------    LABs:     Latest Ref Rng & Units 12/20/2022    4:26 AM 12/18/2022    7:55 AM 12/18/2022    7:45 AM  CBC  WBC 4.0 - 10.5 K/uL 11.8   5.4   Hemoglobin 12.0 - 15.0 g/dL 16.1  09.6  04.5   Hematocrit 36.0 - 46.0 % 36.9  40.0  35.7   Platelets 150 - 400 K/uL 243   275       Latest Ref Rng & Units 12/20/2022    4:26 AM 12/19/2022    2:09 PM 12/18/2022    7:55 AM  CMP  Glucose 70 - 99 mg/dL 409  811  914   BUN 8 - 23 mg/dL 15  14  14    Creatinine 0.44 - 1.00 mg/dL 7.82  9.56  2.13   Sodium 135 - 145 mmol/L 129  131  135   Potassium 3.5 - 5.1 mmol/L 3.8  3.4  3.7   Chloride 98 - 111 mmol/L 96  96  97   CO2 22 - 32 mmol/L 23  27    Calcium 8.9 - 10.3 mg/dL 8.9  9.2    Total Protein 6.5 - 8.1 g/dL  7.0    Total Bilirubin 0.3 - 1.2 mg/dL  0.8    Alkaline Phos 38 - 126 U/L  71    AST 15 - 41 U/L  19    ALT 0 - 44 U/L  12         Micro Results Recent Results (from the past 240 hour(s))  MRSA Next Gen by PCR, Nasal     Status: None   Collection Time: 12/18/22  6:19 PM   Specimen: Nasal Mucosa; Nasal Swab  Result Value Ref Range Status   MRSA by PCR Next Gen NOT DETECTED NOT DETECTED Final    Comment: (NOTE) The GeneXpert MRSA Assay (FDA approved for NASAL specimens only), is one component of a comprehensive MRSA colonization surveillance program. It is not intended to diagnose MRSA infection nor to guide or monitor treatment for MRSA infections. Test performance is not FDA approved in patients less than 49 years old. Performed at [facility], [address] [place], [place] [address]     Radiology Reports No results  found.  SIGNED: Kendell Bane, MD, FHM. FAAFP. [facility] - [facility] Time spent - 35 min.  In seeing, evaluating and examining the patient. Reviewing medical records, labs, drawn plan of care. [facility],  Pager (please use [email or web address] to page/ text) Please use Epic Secure Chat for non-urgent communication (7AM-7PM)  If 7PM-7AM, please contact night-coverage [email or web address], 12/23/2022, 10:38 AM

## 2022-12-23 NOTE — Progress Notes (Signed)
This note made on 12/23/22 at 1538.  Patient has been turned from side to side today and patient opens eyes when receiving care but otherwise resting and breathing normally with resp at 16.  No signs or restlessness or distress.  Unable to do mouth care due to not opening mouth.  No meds given today.  IV removed report called to [place] at [facility].  Transported to [facility] by EMS

## 2022-12-25 NOTE — Discharge Summary (Signed)
Physician Discharge Summary   Patient: Kelsey Thompson MRN: [id number] DOB: 1920/10/30  Admit date:     12/22/2022  Discharge date: 12/23/2022  Discharge Physician: Kendell Bane   PCP: System, Provider Not In  Kelsey Thompson is a 89 year old female with history of paroxysmal atrial fibrillation, HTN, CVA with left-sided hemiparesis, HLD, dementia, GERD, presented with altered mental status from [facility]. At baseline patient is wheelchair-bound, severe dementia with confusion Patient was hospitalized for further evaluation noted for progressive metabolic encephalopathy complicated by subdural hematoma. He continued decline Patient daughter present at bedside daily palliative care was consulted, patient daughter finally agreed to pursue with comfort care-hospice         Assessment & Plan:    Principal Problem:   Hospice care   Acute metabolic encephalopathy/Subdural hematoma Progressively worsening metabolic encephalopathy -Daughter present at bedside after extensive discussion confirming  DNR/DNI status, comfort care   On the palliative care-hospice-comfort care     -Etiology--suspect may be due to SDH -ABG--7.49/37/83/28 on RA -12/18/2022 CT brain--possible thin SDH overlying left lateral frontal lobe versus artifact -6/17 repeat CT brain--Unchanged thin left frontal subdural hematoma measuring 3 mm. No new separate areas of hemorrhage. No mass effect or midline shift. -EEG--no seizure -B12--1059 -TSH--0.740 -Ammonia <10 -UA negative for pyuria     Paroxysmal atrial fibrillation -Currently not on anticoagulation secondary to GI bleed and advanced age -was on IV lopressor in lieu of po metoprolol -Pacemaker     Essential hypertension -Discontinued amlodipine/metoprolol   Dementia without behavioral disturbance -Discontinued trazodone and Seroquel home doses   Hyponatremia- S/P IV fluid normal saline, discontinue   GERD Was on pantoprazole   History of  stroke with left hemiparesis -PT evaluation if mental status improves   Dysphagia-  now on the comfort care   Sinus node dysfunction -s/p PPM   Goals of Care -discussed with daughter--confirmes DNR -Discussed extensively with daughter at bedside, agreed to pursuing with comfort care due to steep decline   Code Status:   Code Status: DNR   Family Communication:  daughter, under hospice/comfort care     Disposition: From  - home                        Planning to discharge to hospice home when bed available Disposition: Hospice care  DISCHARGE MEDICATION: Allergies as of 12/23/2022       Reactions   Cefdinir    Per MAR   Horse-derived Products Other (See Comments)   Tetanus:Reaction unknown   Penicillins Other (See Comments)   Did it involve swelling of the face/tongue/throat, SOB, or low BP? Yes-swelling Did it involve sudden or severe rash/hives, skin peeling, or any reaction on the inside of your mouth or nose? No Did you need to seek medical attention at a hospital or doctor's office? Yes-Dr. office When did it last happen?  4 years prior     If all above answers are "NO", may proceed with cephalosporin use.   Tetanus Antitoxin    Unknown reaction        Medication List    You have not been prescribed any medications.     Discharge Exam: There were no vitals filed for this visit. Agonal breathing, encephalopathic, unresponsive Limited exam  Condition at discharge: critical  The results of significant diagnostics from this hospitalization (including imaging, microbiology, ancillary and laboratory) are listed below for reference.   Imaging Studies: CT HEAD WO CONTRAST ( )  Result  Date: 12/19/2022 CLINICAL DATA:  Subdural hematoma. Altered mental status. EXAM: CT HEAD WITHOUT CONTRAST TECHNIQUE: Contiguous axial images were obtained from the base of the skull through the vertex without intravenous contrast. RADIATION DOSE REDUCTION: This exam was performed  according to the departmental dose-optimization program which includes automated exposure control, adjustment of the mA and/or kV according to patient size and/or use of iterative reconstruction technique. COMPARISON:  Prior head CT examinations 12/08/2022 and earlier. FINDINGS: Brain: Mild generalized cerebral atrophy. A thin acute subdural hematoma overlying the left cerebral hemisphere has not significantly changed in size (3 mm in thickness), but has changed in distribution, now greatest overlying left parietal lobe (series 5, image 38). Patchy and ill-defined hypoattenuation within the cerebral white matter, nonspecific but compatible with advanced chronic small vessel ischemic disease. No demarcated cortical infarct. No evidence of an intracranial mass. No midline shift. Vascular: No hyperdense vessel.  Atherosclerotic calcifications. Skull: No fracture or aggressive osseous lesion. Sinuses/Orbits: No mass or acute finding within the imaged orbits. Mucosal thickening (and possible fluid) within anterior right ethmoid air cells, overall moderate in severity. Minimal mucosal thickening within the right sphenoid and bilateral maxillary sinuses. IMPRESSION: 1. A thin acute subdural hematoma overlying left cerebral hemisphere is unchanged in size (3 mm in thickness), but has changed in distribution, now most prominent overlying the left parietal lobe. 2. Parenchymal atrophy and chronic small vessel ischemic disease. 3. Paranasal sinus disease as described. Electronically Signed   By: Jackey Loge D.O.   On: 12/19/2022 14:24   EEG adult  Result Date: 12/19/2022 Charlsie Quest, MD     12/19/2022  8:45 AM Patient Name: Kelsey Thompson MRN: [id number] Epilepsy Attending: Charlsie Quest Referring Physician/Provider: Catarina Hartshorn, MD Date: 12/18/2022 Duration: 22.46 mins Patient history: 89yo F with ams getting eeg to evaluate for seizure. Level of alertness: Awake AEDs during EEG study: None Technical aspects: This  EEG study was done with scalp electrodes positioned according to the 10-20 International system of electrode placement. Electrical activity was reviewed with band pass filter of 1-70Hz , sensitivity of 7 uV/mm, display speed of 47mm/sec with a 60Hz  notched filter applied as appropriate. EEG data were recorded continuously and digitally stored.  Video monitoring was available and reviewed as appropriate. Description: EEG showed continuous generalized predominantly 5 to 6 Hz theta slowing admixed with intermittent 2-3Hz  delta slowing. Hyperventilation and photic stimulation were not performed.   ABNORMALITY - Continuous slow, generalized IMPRESSION: This study is suggestive of moderate diffuse encephalopathy, nonspecific etiology. No seizures or epileptiform discharges were seen throughout the recording. Charlsie Quest   CT Head Wo Contrast  Result Date: 12/18/2022 CLINICAL DATA:  Mental status change EXAM: CT HEAD WITHOUT CONTRAST TECHNIQUE: Contiguous axial images were obtained from the base of the skull through the vertex without intravenous contrast. RADIATION DOSE REDUCTION: This exam was performed according to the departmental dose-optimization program which includes automated exposure control, adjustment of the mA and/or kV according to patient size and/or use of iterative reconstruction technique. COMPARISON:  Head CT 12/18/2022 FINDINGS: Brain: Thin left frontal subdural hematoma measuring 3 mm appears unchanged from prior. No new separate areas of hemorrhage. No acute infarct, mass effect or midline shift. No hydrocephalus. Again seen is mild diffuse atrophy and extensive periventricular and deep white matter hypodensity, likely chronic small vessel ischemic change. Vascular: Atherosclerotic calcifications are present within the cavernous internal carotid arteries. Skull: Normal. Negative for fracture or focal lesion. Sinuses/Orbits: No acute finding. Other: None. IMPRESSION: 1. Unchanged  thin left  frontal subdural hematoma measuring 3 mm. No new separate areas of hemorrhage. No mass effect or midline shift. 2. Stable atrophy and chronic small vessel ischemic change. Electronically Signed   By: Darliss Cheney M.D.   On: 12/18/2022 15:24   CT CHEST ABDOMEN PELVIS WO CONTRAST  Result Date: 12/18/2022 CLINICAL DATA:  Unintended weight loss, altered mental status EXAM: CT CHEST, ABDOMEN AND PELVIS WITHOUT CONTRAST TECHNIQUE: Multidetector CT imaging of the chest, abdomen and pelvis was performed following the standard protocol without IV contrast. RADIATION DOSE REDUCTION: This exam was performed according to the departmental dose-optimization program which includes automated exposure control, adjustment of the mA and/or kV according to patient size and/or use of iterative reconstruction technique. COMPARISON:  CT pelvis done on 11/07/2022 FINDINGS: CT CHEST FINDINGS Cardiovascular: Coronary artery calcifications are seen. Calcification is seen in mitral annulus. Pacemaker leads are noted in place. There are scattered calcifications in thoracic aorta and its major branches. Small pericardial effusion is present. Mediastinum/Nodes: No significant lymphadenopathy is seen. Lungs/Pleura: Breathing motion limits evaluation. As far as seen, there is no focal pulmonary consolidation or discrete nodules. There is no pleural effusion or pneumothorax. Musculoskeletal: Degenerative changes are noted with encroachment of neural foramina in lower cervical and upper thoracic spine. No acute findings are seen. CT ABDOMEN PELVIS FINDINGS Hepatobiliary: No focal abnormalities are seen in liver. There is no dilation of bile ducts. There is mild wall thickening in the gallbladder. Pancreas: No focal abnormalities are seen. Spleen: Spleen appears smaller than usual in size. Adrenals/Urinary Tract: Adrenals are unremarkable. There is slight prominence of collecting system in the left kidney. Right kidney is smaller than left. There  are no renal or ureteral stones. Urinary bladder is unremarkable. Stomach/Bowel: Large hiatal hernia is seen. There is no significant small bowel dilation. The appendix is not seen. There is no wall thickening in colon. Multiple diverticula seen in colon without evidence of focal acute diverticulitis. Vascular/Lymphatic: Extensive arterial calcifications are seen. Reproductive: There are small calcified uterine fibroids. Other: There is no ascites or pneumoperitoneum. Musculoskeletal: There is moderate anterolisthesis at L5-S1 level. Spondylolysis is seen in the L5 vertebra. There is 60% decrease in height of body of L5 vertebra with no interval change. IMPRESSION: No acute findings are seen a noncontrast CT chest, abdomen and pelvis. There are no infiltrates or nodules in the lung fields. Aortic arteriosclerosis. Coronary artery disease. There is no evidence of intestinal obstruction or pneumoperitoneum. There is slight prominence of the pelvocaliceal system in the left kidney which may be a normal variation or suggest residual change from previous obstruction. There are no demonstrable renal or ureteral stones. Large fixed hiatal hernia. Aortic arteriosclerosis. Diverticulosis of colon without signs of focal diverticulitis. Old compression fracture is seen in the body of L5 vertebra. Spondylolysis is seen in the L5 vertebra with spondylolisthesis at the L5-S1 level. These findings appear stable. Degenerative changes are noted with encroachment of neural foramina in lower cervical and upper thoracic spine. Electronically Signed   By: Ernie Avena M.D.   On: 12/18/2022 10:16   CT HEAD WO CONTRAST  Result Date: 12/18/2022 CLINICAL DATA:  Provided history: Mental status change, unknown cause. Altered mental status. EXAM: CT HEAD WITHOUT CONTRAST TECHNIQUE: Contiguous axial images were obtained from the base of the skull through the vertex without intravenous contrast. RADIATION DOSE REDUCTION: This exam was  performed according to the departmental dose-optimization program which includes automated exposure control, adjustment of the mA and/or kV according to  patient size and/or use of iterative reconstruction technique. COMPARISON:  Head CT 11/07/2022. FINDINGS: Mildly motion degraded exam. Brain: Generalized cerebral atrophy. Apparent hyperdensity overlying portions of the lateral left frontal lobe, which may reflect a thin subdural hematoma (3 mm in thickness) versus artifact from motion (for instance as seen on series 3, image 18) (series 5, image 37). Patchy and ill-defined hypoattenuation within the cerebral white matter, nonspecific but compatible with advanced chronic small vessel ischemic disease. No demarcated cortical infarct. No evidence of an intracranial mass. No midline shift. Vascular: No hyperdense vessel.  Atherosclerotic calcifications. Skull: No fracture or aggressive osseous lesion. Sinuses/Orbits: No mass or acute finding within the imaged orbits. Minimal mucosal thickening within the bilateral maxillary sinuses. Small-volume frothy secretions within the right sphenoid sinus. Trace mucosal thickening within bilateral ethmoid air cells. Impression #2 called by telephone at the time of interpretation on 12/18/2022 at 8:40 am to provider [facility] , who verbally acknowledged these results. IMPRESSION: 1. Mildly motion degraded exam. 2. Possible thin acute subdural hematoma overlying the lateral left frontal lobe (versus artifact from motion). A short-interval follow-up non-contrast head CT is recommended. 3. Parenchymal atrophy and chronic small vessel ischemic disease. 4. Mild paranasal sinus disease as described. Electronically Signed   By: Jackey Loge D.O.   On: 12/18/2022 08:43   DG Chest Portable 1 View  Result Date: 12/18/2022 CLINICAL DATA:  One 89 year old female with altered mental status, last known well 0300 hours. EXAM: PORTABLE CHEST 1 VIEW COMPARISON:  Total chest 03/28/2022 and  earlier. FINDINGS: Portable AP semi upright view at 0752 hours. Mildly rotated to the right. Stable cardiomegaly and mediastinal contours. Stable left chest cardiac pacemaker. Large lung volumes. Visualized tracheal air column is within normal limits. Allowing for portable technique the lungs are clear. No pneumothorax or pleural effusion identified. Paucity of bowel gas in the visible abdomen. No acute osseous abnormality identified. IMPRESSION: Stable cardiomegaly. Pulmonary hyperinflation. No acute cardiopulmonary abnormality. Electronically Signed   By: Odessa Fleming M.D.   On: 12/18/2022 08:00    Microbiology: Results for orders placed or performed during the hospital encounter of 12/18/22  MRSA Next Gen by PCR, Nasal     Status: None   Collection Time: 12/18/22  6:19 PM   Specimen: Nasal Mucosa; Nasal Swab  Result Value Ref Range Status   MRSA by PCR Next Gen NOT DETECTED NOT DETECTED Final    Comment: (NOTE) The GeneXpert MRSA Assay (FDA approved for NASAL specimens only), is one component of a comprehensive MRSA colonization surveillance program. It is not intended to diagnose MRSA infection nor to guide or monitor treatment for MRSA infections. Test performance is not FDA approved in patients less than 20 years old. Performed at [facility], [address], [place], [place] [address]     Labs: CBC: Recent Labs  Lab 12/20/22 0426  WBC 11.8*  HGB 12.3  HCT 36.9  MCV 91.8  PLT 243   Basic Metabolic Panel: Recent Labs  Lab 12/19/22 1409 12/20/22 0426  NA 131* 129*  K 3.4* 3.8  CL 96* 96*  CO2 27 23  GLUCOSE 110* 128*  BUN 14 15  CREATININE 0.74 0.66  CALCIUM 9.2 8.9   Liver Function Tests: Recent Labs  Lab 12/19/22 1409  AST 19  ALT 12  ALKPHOS 71  BILITOT 0.8  PROT 7.0  ALBUMIN 3.5   CBG: No results for input(s): "GLUCAP" in the last 168 hours.  Discharge time spent: greater than 30  minutes.  Signed: Kendell Bane, MD [facility] 12/25/2022

## 2023-01-01 DEATH — deceased

## 2023-01-22 ENCOUNTER — Ambulatory Visit: Payer: Medicare Other

## 2023-04-23 ENCOUNTER — Ambulatory Visit: Payer: Medicare Other
